# Patient Record
Sex: Female | Born: 1995
Health system: Southern US, Community
[De-identification: ages and names within clinical notes are randomized; demographics above are authoritative.]

## PROBLEM LIST (undated history)

## (undated) DIAGNOSIS — I10 Essential (primary) hypertension: Secondary | ICD-10-CM

## (undated) DIAGNOSIS — O24419 Gestational diabetes mellitus in pregnancy, unspecified control: Secondary | ICD-10-CM

## (undated) DIAGNOSIS — Z789 Other specified health status: Secondary | ICD-10-CM

## (undated) HISTORY — PX: ADENOIDECTOMY: SUR15

## (undated) HISTORY — DX: Gestational diabetes mellitus in pregnancy, unspecified control: O24.419

## (undated) HISTORY — PX: EYE SURGERY: SHX253

---
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
## 2018-08-28 ENCOUNTER — Encounter: Payer: Self-pay | Admitting: Obstetrics

## 2018-08-28 ENCOUNTER — Ambulatory Visit (INDEPENDENT_AMBULATORY_CARE_PROVIDER_SITE_OTHER): Payer: Self-pay

## 2018-08-28 ENCOUNTER — Other Ambulatory Visit: Payer: Self-pay

## 2018-08-28 VITALS — BP 140/93 | HR 101 | Ht 62.0 in | Wt 199.8 lb

## 2018-08-28 DIAGNOSIS — Z3201 Encounter for pregnancy test, result positive: Secondary | ICD-10-CM

## 2018-08-28 LAB — POCT URINE PREGNANCY: Preg Test, Ur: POSITIVE — AB

## 2018-08-28 NOTE — Progress Notes (Signed)
Presented for UPT.    Gloria Kemp presents today for UPT. She has no unusual complaints.  LMP:07/09/2018    OBJECTIVE: Appears well, in no apparent distress.  OB History    Gravida  1   Para      Term      Preterm      AB      Living        SAB      TAB      Ectopic      Multiple      Live Births             Home UPT Result: POSITIVE In-Office UPT result: POSITIVE  I have reviewed the patient's medical, obstetrical, social, and family histories, and medications.   ASSESSMENT: Positive pregnancy test  PLAN Prenatal care to be completed at: [facility].  Patient will see PCP for BP management.

## 2018-09-16 ENCOUNTER — Ambulatory Visit (INDEPENDENT_AMBULATORY_CARE_PROVIDER_SITE_OTHER): Payer: Self-pay

## 2018-09-16 ENCOUNTER — Other Ambulatory Visit: Payer: Self-pay

## 2018-09-16 DIAGNOSIS — Z3400 Encounter for supervision of normal first pregnancy, unspecified trimester: Secondary | ICD-10-CM

## 2018-09-16 HISTORY — DX: Encounter for supervision of normal first pregnancy, unspecified trimester: Z34.00

## 2018-09-16 NOTE — Progress Notes (Signed)
I connected with  Gloria Kemp on 09/16/18 by a video enabled telemedicine application and verified that I am speaking with the correct person using two identifiers.   I discussed the limitations of evaluation and management by telemedicine. The patient expressed understanding and agreed to proceed.  New OB Intake done.  Initial visit 10/07/2018 @ 1:00 pm.

## 2018-10-07 ENCOUNTER — Encounter: Payer: Self-pay | Admitting: Obstetrics & Gynecology

## 2018-10-07 ENCOUNTER — Ambulatory Visit (INDEPENDENT_AMBULATORY_CARE_PROVIDER_SITE_OTHER): Payer: Managed Care, Other (non HMO) | Admitting: Obstetrics & Gynecology

## 2018-10-07 ENCOUNTER — Other Ambulatory Visit: Payer: Self-pay

## 2018-10-07 VITALS — BP 138/91 | HR 97 | Wt 194.4 lb

## 2018-10-07 DIAGNOSIS — Z113 Encounter for screening for infections with a predominantly sexual mode of transmission: Secondary | ICD-10-CM

## 2018-10-07 DIAGNOSIS — O99212 Obesity complicating pregnancy, second trimester: Secondary | ICD-10-CM

## 2018-10-07 DIAGNOSIS — O99210 Obesity complicating pregnancy, unspecified trimester: Secondary | ICD-10-CM

## 2018-10-07 DIAGNOSIS — Z3400 Encounter for supervision of normal first pregnancy, unspecified trimester: Secondary | ICD-10-CM

## 2018-10-07 DIAGNOSIS — Z124 Encounter for screening for malignant neoplasm of cervix: Secondary | ICD-10-CM | POA: Diagnosis not present

## 2018-10-07 DIAGNOSIS — Z3A12 12 weeks gestation of pregnancy: Secondary | ICD-10-CM

## 2018-10-07 MED ORDER — ASPIRIN 81 MG PO CHEW: 81.0000 mg | CHEWABLE_TABLET | Freq: Every day | ORAL | 3 refills | Status: DC

## 2018-10-07 MED ORDER — BLOOD PRESSURE MONITORING KIT: 1.0000 | PACK | 0 refills | Status: DC

## 2018-10-07 NOTE — Progress Notes (Signed)
  Subjective:    Gloria Kemp is being seen today for her first obstetrical visit.  This is not a planned pregnancy. She is at [redacted]w[redacted]d gestation. Her obstetrical history is significant for obesity and Hypertension. Relationship with FOB: significant other, not living together. Patient does intend to breast feed. Pregnancy history fully reviewed.  Patient reports no complaints.  Review of Systems:   Review of Systems  Objective:     BP (!) 138/91   Pulse 97   Wt 194 lb 6.4 oz (88.2 kg)   LMP 07/09/2018 (Approximate)   BMI 35.56 kg/m  Physical Exam  Exam  Breathing, conversing, and ambulating normally Well nourished, well hydrated Black female, no apparent distress Heart- rrr Breast- normal bilateral Lungs- CTAB Abd- benign Vulva/vagina/cervix- normal Pelvis- normal    Assessment:    Pregnancy: G1P0 Patient Active Problem List   Diagnosis Date Noted  . Supervision of normal first pregnancy 09/16/2018       Plan:     Initial labs drawn. Prenatal vitamins. Problem list reviewed and updated. NIPS/Horizon today Role of ultrasound in pregnancy discussed; fetal survey: ordered. Amniocentesis discussed: not indicated. Follow up in 4 weeks, on Mychart Rec gain less than 20 pounds Baby asa daily Check pap, pr/cr, cmp, hba1c  She told me that she has already downloaded Mychart app and I rec'd that she download baby scripts Baby scripts [place] 10/07/2018

## 2018-10-07 NOTE — Addendum Note (Signed)
Addended by: Tristan Schroeder D on: 10/07/2018 02:11 PM   Modules accepted: Orders

## 2018-10-07 NOTE — Addendum Note (Signed)
Addended by: Tristan Schroeder D on: 10/07/2018 03:11 PM   Modules accepted: Orders

## 2018-10-07 NOTE — Progress Notes (Signed)
NOB pt denies pain today.

## 2018-10-07 NOTE — Addendum Note (Signed)
Addended by: Lewie Loron D on: 10/07/2018 02:59 PM   Modules accepted: Orders

## 2018-10-08 ENCOUNTER — Telehealth: Payer: Self-pay | Admitting: *Deleted

## 2018-10-08 LAB — OBSTETRIC PANEL, INCLUDING HIV
Antibody Screen: NEGATIVE
Basophils Absolute: 0 10*3/uL (ref 0.0–0.2)
Basos: 0 %
EOS (ABSOLUTE): 0.1 10*3/uL (ref 0.0–0.4)
Eos: 1 %
HIV Screen 4th Generation wRfx: NONREACTIVE
Hematocrit: 39.4 % (ref 34.0–46.6)
Hemoglobin: 12.9 g/dL (ref 11.1–15.9)
Hepatitis B Surface Ag: NEGATIVE
Immature Grans (Abs): 0 10*3/uL (ref 0.0–0.1)
Immature Granulocytes: 0 %
Lymphocytes Absolute: 2.2 10*3/uL (ref 0.7–3.1)
Lymphs: 25 %
MCH: 24.1 pg — ABNORMAL LOW (ref 26.6–33.0)
MCHC: 32.7 g/dL (ref 31.5–35.7)
MCV: 74 fL — ABNORMAL LOW (ref 79–97)
Monocytes Absolute: 0.8 10*3/uL (ref 0.1–0.9)
Monocytes: 9 %
Neutrophils Absolute: 5.8 10*3/uL (ref 1.4–7.0)
Neutrophils: 65 %
Platelets: 277 10*3/uL (ref 150–450)
RBC: 5.35 x10E6/uL — ABNORMAL HIGH (ref 3.77–5.28)
RDW: 18.3 % — ABNORMAL HIGH (ref 11.7–15.4)
RPR Ser Ql: NONREACTIVE
Rh Factor: POSITIVE
Rubella Antibodies, IGG: 3.61 index (ref >0.99)
WBC: 8.9 10*3/uL (ref 3.4–10.8)

## 2018-10-08 LAB — COMPREHENSIVE METABOLIC PANEL
ALT: 15 IU/L (ref 0–32)
AST: 16 IU/L (ref 0–40)
Albumin/Globulin Ratio: 1.8 (ref 1.2–2.2)
Albumin: 4.4 g/dL (ref 3.9–5.0)
Alkaline Phosphatase: 57 IU/L (ref 39–117)
BUN/Creatinine Ratio: 11 (ref 9–23)
BUN: 7 mg/dL (ref 6–20)
Bilirubin Total: <0.2 mg/dL (ref 0.0–1.2)
CO2: 19 mmol/L — ABNORMAL LOW (ref 20–29)
Calcium: 9.8 mg/dL (ref 8.7–10.2)
Chloride: 98 mmol/L (ref 96–106)
Creatinine, Ser: 0.63 mg/dL (ref 0.57–1.00)
GFR calc Af Amer: 148 mL/min/{1.73_m2} (ref >59)
GFR calc non Af Amer: 129 mL/min/{1.73_m2} (ref >59)
Globulin, Total: 2.5 g/dL (ref 1.5–4.5)
Glucose: 82 mg/dL (ref 65–99)
Potassium: 4.1 mmol/L (ref 3.5–5.2)
Sodium: 135 mmol/L (ref 134–144)
Total Protein: 6.9 g/dL (ref 6.0–8.5)

## 2018-10-08 LAB — HEMOGLOBIN A1C
Est. average glucose Bld gHb Est-mCnc: 117 mg/dL
Hgb A1c MFr Bld: 5.7 % — ABNORMAL HIGH (ref 4.8–5.6)

## 2018-10-08 LAB — PROTEIN / CREATININE RATIO, URINE
Creatinine, Urine: 288.8 mg/dL
Protein, Ur: 102.9 mg/dL
Protein/Creat Ratio: 356 mg/g creat — ABNORMAL HIGH (ref 0–200)

## 2018-10-08 LAB — TSH: TSH: 1.42 u[IU]/mL (ref 0.450–4.500)

## 2018-10-08 NOTE — Telephone Encounter (Signed)
Pt called to office stating she needs a note for work regarding N&V.    Attempt to return call.  No answer, no VM.

## 2018-10-09 LAB — CYTOLOGY - PAP
Chlamydia: NEGATIVE
Diagnosis: NEGATIVE
Neisseria Gonorrhea: NEGATIVE

## 2018-10-10 LAB — URINE CULTURE, OB REFLEX

## 2018-10-10 LAB — CULTURE, OB URINE

## 2018-10-15 ENCOUNTER — Encounter: Payer: Self-pay | Admitting: Obstetrics & Gynecology

## 2018-10-15 ENCOUNTER — Telehealth: Payer: Self-pay

## 2018-10-15 NOTE — Telephone Encounter (Signed)
Notified pt of insufficient fetal DNA on genetic screening, needs re-draw. Pt agreed to have it done at next appt on 10-24-18.

## 2018-10-16 ENCOUNTER — Encounter: Payer: Self-pay | Admitting: Obstetrics & Gynecology

## 2018-10-16 DIAGNOSIS — D563 Thalassemia minor: Secondary | ICD-10-CM | POA: Insufficient documentation

## 2018-10-19 DIAGNOSIS — O99210 Obesity complicating pregnancy, unspecified trimester: Secondary | ICD-10-CM | POA: Insufficient documentation

## 2018-10-24 ENCOUNTER — Other Ambulatory Visit: Payer: Self-pay

## 2018-10-24 DIAGNOSIS — Z3400 Encounter for supervision of normal first pregnancy, unspecified trimester: Secondary | ICD-10-CM

## 2018-10-24 NOTE — Addendum Note (Signed)
Addended by: Tristan Schroeder D on: 10/24/2018 10:28 AM   Modules accepted: Orders

## 2018-10-24 NOTE — Addendum Note (Signed)
Addended by: Tristan Schroeder D on: 10/24/2018 10:18 AM   Modules accepted: Orders

## 2018-10-25 LAB — GLUCOSE TOLERANCE, 2 HOURS W/ 1HR
Glucose, 1 hour: 207 mg/dL — ABNORMAL HIGH (ref 65–179)
Glucose, 2 hour: 148 mg/dL (ref 65–152)
Glucose, Fasting: 96 mg/dL — ABNORMAL HIGH (ref 65–91)

## 2018-10-27 ENCOUNTER — Other Ambulatory Visit: Payer: Self-pay

## 2018-10-27 DIAGNOSIS — O24419 Gestational diabetes mellitus in pregnancy, unspecified control: Secondary | ICD-10-CM

## 2018-10-27 DIAGNOSIS — Z3400 Encounter for supervision of normal first pregnancy, unspecified trimester: Secondary | ICD-10-CM

## 2018-10-27 HISTORY — DX: Gestational diabetes mellitus in pregnancy, unspecified control: O24.419

## 2018-10-27 MED ORDER — ACCU-CHEK GUIDE W/DEVICE KIT: 1.0000 | PACK | Freq: Four times a day (QID) | 0 refills | Status: DC

## 2018-10-27 MED ORDER — ACCU-CHEK GUIDE VI STRP: ORAL_STRIP | 12 refills | Status: DC

## 2018-10-27 MED ORDER — ACCU-CHEK FASTCLIX LANCETS MISC: 1.0000 [IU] | Freq: Four times a day (QID) | 12 refills | Status: DC

## 2018-11-04 ENCOUNTER — Other Ambulatory Visit (HOSPITAL_COMMUNITY): Payer: Self-pay | Admitting: Obstetrics

## 2018-11-04 ENCOUNTER — Telehealth (INDEPENDENT_AMBULATORY_CARE_PROVIDER_SITE_OTHER): Payer: Medicaid Other | Admitting: Obstetrics

## 2018-11-04 ENCOUNTER — Encounter: Payer: Self-pay | Admitting: Obstetrics

## 2018-11-04 DIAGNOSIS — D563 Thalassemia minor: Secondary | ICD-10-CM

## 2018-11-04 DIAGNOSIS — O0990 Supervision of high risk pregnancy, unspecified, unspecified trimester: Secondary | ICD-10-CM

## 2018-11-04 DIAGNOSIS — O0992 Supervision of high risk pregnancy, unspecified, second trimester: Secondary | ICD-10-CM

## 2018-11-04 DIAGNOSIS — O24419 Gestational diabetes mellitus in pregnancy, unspecified control: Secondary | ICD-10-CM | POA: Diagnosis not present

## 2018-11-04 DIAGNOSIS — Z3A16 16 weeks gestation of pregnancy: Secondary | ICD-10-CM | POA: Diagnosis not present

## 2018-11-04 NOTE — Progress Notes (Signed)
   [place] VIRTUAL VIDEO VISIT ENCOUNTER NOTE  Provider location: [facility] at [place]   I connected with Gloria Kemp on 11/04/18 at 10:00 AM EDT by WebEx OB MyChart Video Encounter at home and verified that I am speaking with the correct person using two identifiers.   I discussed the limitations, risks, security and privacy concerns of performing an evaluation and management service virtually and the availability of in person appointments. I also discussed with the patient that there may be a patient responsible charge related to this service. The patient expressed understanding and agreed to proceed. Subjective:  Gloria Kemp is a 23 y.o. G1P0 at [redacted]w[redacted]d being seen today for ongoing prenatal care.  She is currently monitored for the following issues for this high-risk pregnancy and has Supervision of normal first pregnancy; Alpha thalassemia silent carrier; Obesity in pregnancy; and Gestational diabetes on their problem list.  Patient reports no complaints.  Contractions: Not present. Vag. Bleeding: None.   . Denies any leaking of fluid.   The following portions of the patient's history were reviewed and updated as appropriate: allergies, current medications, past family history, past medical history, past social history, past surgical history and problem list.   Objective:  There were no vitals filed for this visit.  Fetal Status:           General:  Alert, oriented and cooperative. Patient is in no acute distress.  Respiratory: Normal respiratory effort, no problems with respiration noted  Mental Status: Normal mood and affect. Normal behavior. Normal judgment and thought content.  Rest of physical exam deferred due to type of encounter  Imaging: No results found.  Assessment and Plan:  Pregnancy: G1P0 at [redacted]w[redacted]d 1. Supervision of high risk pregnancy, antepartum  2. Gestational diabetes mellitus (GDM), antepartum, gestational diabetes  method of control unspecified  3. Alpha thalassemia silent carrier   Preterm labor symptoms and general obstetric precautions including but not limited to vaginal bleeding, contractions, leaking of fluid and fetal movement were reviewed in detail with the patient. I discussed the assessment and treatment plan with the patient. The patient was provided an opportunity to ask questions and all were answered. The patient agreed with the plan and demonstrated an understanding of the instructions. The patient was advised to call back or seek an in-person office evaluation/go to MAU at [facility] for any urgent or concerning symptoms. Please refer to After Visit Summary for other counseling recommendations.   I provided 10 minutes of face-to-face time during this encounter.  Return in about 4 weeks (around 12/02/2018) for MyChart.  Future Appointments  Date Time Provider [place]  11/19/2018  1:30 PM WH-MFC [place] 1 WH-MFCUS MFC-US  11/19/2018  3:00 PM [place]    Baltazar Najjar, [place] [facility], [facility] 11-04-2018

## 2018-11-04 NOTE — Progress Notes (Addendum)
I connected with Gloria Kemp on 11/04/18 at 10:00 AM EDT by telephone and verified that I am speaking with the correct person using two identifiers.  Pt did not start checking CBG's. She will pick up supplies this wk. Pt requests genetic screening results.  Results still pending. Pt did not receive cuff yet. Will refax today

## 2018-11-06 ENCOUNTER — Encounter: Payer: Self-pay | Admitting: Obstetrics

## 2018-11-19 ENCOUNTER — Ambulatory Visit: Payer: Medicaid Other | Admitting: *Deleted

## 2018-11-19 ENCOUNTER — Encounter: Payer: Managed Care, Other (non HMO) | Attending: Family Medicine | Admitting: *Deleted

## 2018-11-19 ENCOUNTER — Other Ambulatory Visit: Payer: Self-pay

## 2018-11-19 ENCOUNTER — Ambulatory Visit (HOSPITAL_COMMUNITY)
Admission: RE | Admit: 2018-11-19 | Discharge: 2018-11-19 | Disposition: A | Discharge status: Home / Self Care | Payer: Medicaid Other | Source: Ambulatory Visit | Attending: Obstetrics & Gynecology | Admitting: Obstetrics & Gynecology

## 2018-11-19 ENCOUNTER — Other Ambulatory Visit (HOSPITAL_COMMUNITY): Payer: Self-pay | Admitting: *Deleted

## 2018-11-19 ENCOUNTER — Other Ambulatory Visit: Payer: Self-pay | Admitting: Obstetrics & Gynecology

## 2018-11-19 DIAGNOSIS — Z3400 Encounter for supervision of normal first pregnancy, unspecified trimester: Secondary | ICD-10-CM

## 2018-11-19 DIAGNOSIS — Z713 Dietary counseling and surveillance: Secondary | ICD-10-CM | POA: Diagnosis present

## 2018-11-19 DIAGNOSIS — O99012 Anemia complicating pregnancy, second trimester: Secondary | ICD-10-CM | POA: Diagnosis not present

## 2018-11-19 DIAGNOSIS — O24312 Unspecified pre-existing diabetes mellitus in pregnancy, second trimester: Secondary | ICD-10-CM | POA: Diagnosis not present

## 2018-11-19 DIAGNOSIS — O359XX0 Maternal care for (suspected) fetal abnormality and damage, unspecified, not applicable or unspecified: Secondary | ICD-10-CM | POA: Diagnosis not present

## 2018-11-19 DIAGNOSIS — O24119 Pre-existing diabetes mellitus, type 2, in pregnancy, unspecified trimester: Secondary | ICD-10-CM

## 2018-11-19 DIAGNOSIS — O24419 Gestational diabetes mellitus in pregnancy, unspecified control: Secondary | ICD-10-CM | POA: Diagnosis not present

## 2018-11-19 DIAGNOSIS — Z3A17 17 weeks gestation of pregnancy: Secondary | ICD-10-CM

## 2018-11-19 DIAGNOSIS — Z3687 Encounter for antenatal screening for uncertain dates: Secondary | ICD-10-CM

## 2018-11-19 NOTE — Progress Notes (Signed)
Patient was seen on 11/19/2018 for Impaired Glucose Tolerance at 19 weeks. There is an A1c of 5.7% on July 14 that indicates a history of Pre-Diabetes prior to this pregnancy.  EDD 04/26/2019. Patient states not aware of history of pre- diabetes although she states she has a strong family history.  Diet history obtained. Patient eats poor variety of all food groups with most meals being eaten at [employer] where she works part time. Beverages include regular soda, some water and occasional diet soda.  Patient is currently on no diabetes medications.  The following learning objectives were met by the patient :   States the definition of Pre-diabetes with pregnancy   States why dietary management is important in controlling blood glucose  Describes the effects of carbohydrates on blood glucose levels  Demonstrates ability to create a balanced meal plan  Demonstrates carbohydrate counting   States when to check blood glucose levels  Demonstrates proper blood glucose monitoring techniques  States the effect of stress and exercise on blood glucose levels  States the importance of limiting caffeine and abstaining from alcohol and smoking  Plan:   Aim for 3 Carb Choices per meal (45 grams) +/- 1 either way   Aim for 1-2 Carbs per snack  Begin reading food labels for Total Carbohydrate of foods  Consider  increasing your activity level by walking or other activity daily as tolerated  Begin checking BG before breakfast and 2 hours after first bite of breakfast, lunch and dinner as directed by MD   Bring Log Book/Sheet and meter to every medical appointment OR use Baby Scripts (see below)  Patient was introduced to [employer], she plans to use as record of BG electronically   Take medication as directed by MD  Blood glucose monitor Rx called into pharmacy by MD office, she has not picked it up yet.   Accu Check Guide with Fast Clix drums Patient instructed to test pre breakfast and 2  hours each meal as directed by MD  Patient instructed to monitor glucose levels: FBS: 60 - 95 mg/dl 2 hour: <120 mg/dl  Patient received the following handouts:  Nutrition Diabetes and Pregnancy  Carbohydrate Counting List  Patient will be seen for follow-up in 1 month and as needed.

## 2018-12-01 ENCOUNTER — Encounter: Payer: Self-pay | Admitting: Family Medicine

## 2018-12-02 ENCOUNTER — Telehealth: Payer: Medicaid Other | Admitting: Obstetrics

## 2018-12-02 ENCOUNTER — Encounter: Payer: Self-pay | Admitting: Obstetrics

## 2018-12-02 MED ORDER — BLOOD PRESSURE MONITOR KIT: 1.0000 | PACK | 0 refills | Status: DC

## 2018-12-02 NOTE — Progress Notes (Signed)
Virtual ROB   CC: Back Pain   Patient has not received B/P Cuff resent to [employer].

## 2018-12-02 NOTE — Progress Notes (Signed)
Patient rescheduled.  Shelly Bombard, MD 12/02/2018 2:12 PM

## 2018-12-11 ENCOUNTER — Telehealth (INDEPENDENT_AMBULATORY_CARE_PROVIDER_SITE_OTHER): Payer: Medicaid Other | Admitting: Obstetrics

## 2018-12-11 ENCOUNTER — Encounter: Payer: Self-pay | Admitting: Obstetrics

## 2018-12-11 ENCOUNTER — Other Ambulatory Visit: Payer: Self-pay

## 2018-12-11 ENCOUNTER — Encounter (HOSPITAL_COMMUNITY): Payer: Self-pay | Admitting: Obstetrics & Gynecology

## 2018-12-11 DIAGNOSIS — M549 Dorsalgia, unspecified: Secondary | ICD-10-CM

## 2018-12-11 DIAGNOSIS — O99210 Obesity complicating pregnancy, unspecified trimester: Secondary | ICD-10-CM

## 2018-12-11 DIAGNOSIS — O24410 Gestational diabetes mellitus in pregnancy, diet controlled: Secondary | ICD-10-CM

## 2018-12-11 DIAGNOSIS — Z3A20 20 weeks gestation of pregnancy: Secondary | ICD-10-CM

## 2018-12-11 DIAGNOSIS — O99212 Obesity complicating pregnancy, second trimester: Secondary | ICD-10-CM

## 2018-12-11 DIAGNOSIS — O35BXX0 Maternal care for other (suspected) fetal abnormality and damage, fetal cardiac anomalies, not applicable or unspecified: Secondary | ICD-10-CM | POA: Insufficient documentation

## 2018-12-11 DIAGNOSIS — D563 Thalassemia minor: Secondary | ICD-10-CM

## 2018-12-11 DIAGNOSIS — O0990 Supervision of high risk pregnancy, unspecified, unspecified trimester: Secondary | ICD-10-CM

## 2018-12-11 DIAGNOSIS — O0992 Supervision of high risk pregnancy, unspecified, second trimester: Secondary | ICD-10-CM

## 2018-12-11 MED ORDER — COMFORT FIT MATERNITY SUPP SM MISC: 0 refills | Status: DC

## 2018-12-11 NOTE — Addendum Note (Signed)
Addended by: Baltazar Najjar A on: 12/11/2018 10:54 AM   Modules accepted: Level of Service

## 2018-12-11 NOTE — Progress Notes (Signed)
TELEHEALTH OBSTETRICS PRENATAL VIRTUAL VIDEO VISIT ENCOUNTER NOTE  Provider location: [facility] at [place]   I connected with Gloria Kemp on 12/11/18 at  9:30 AM EDT by OB MyChart Video Encounter at home and verified that I am speaking with the correct person using two identifiers.   I discussed the limitations, risks, security and privacy concerns of performing an evaluation and management service virtually and the availability of in person appointments. I also discussed with the patient that there may be a patient responsible charge related to this service. The patient expressed understanding and agreed to proceed. Subjective:  Gloria Kemp is a 23 y.o. G1P0 at [redacted]w[redacted]d being seen today for ongoing prenatal care.  She is currently monitored for the following issues for this high-risk pregnancy and has Supervision of normal first pregnancy; Alpha thalassemia silent carrier; Obesity in pregnancy; and Gestational diabetes on their problem list.  Patient reports backache.  Contractions: Not present. Vag. Bleeding: None.  Movement: Present. Denies any leaking of fluid.   The following portions of the patient's history were reviewed and updated as appropriate: allergies, current medications, past family history, past medical history, past social history, past surgical history and problem list.   Objective:  There were no vitals filed for this visit.  Fetal Status:     Movement: Present     General:  Alert, oriented and cooperative. Patient is in no acute distress.  Respiratory: Normal respiratory effort, no problems with respiration noted  Mental Status: Normal mood and affect. Normal behavior. Normal judgment and thought content.  Rest of physical exam deferred due to type of encounter  Imaging: [place] Mfm Ob Detail +14 Wk  Result Date: 11/19/2018 ----------------------------------------------------------------------  OBSTETRICS REPORT                       (Signed Final  11/19/2018 05:04 pm) ---------------------------------------------------------------------- Patient Info  ID #:       [id number]                          D.O.B.:  [birth year] (22 yrs)  Name:       Gloria Kemp                  Visit Date: 11/19/2018 01:23 pm ---------------------------------------------------------------------- Performed By  Performed By:     Earley Brooke     Ref. Address:     [address]                                                             Road                                                             [address] [phone]  [place]                                                             [place]  Attending:        Sander Nephew      [facility]                    MD                                       [facility]  Referred By:      [place] ---------------------------------------------------------------------- Orders   #  Description                          Code         Ordered By   1  [place] MFM OB DETAIL +14 [place]              76811.01     [facility]  ----------------------------------------------------------------------   #  Order #                    Accession #                 Episode #   1  [id number]                  [id number]                  [id number]  ---------------------------------------------------------------------- Indications   Encounter for antenatal screening for          Z36.3   malformations   [redacted] weeks gestation of pregnancy                Z3A.17   Diabetes - Pregestational, 2nd trimester       O24.312   Maternal thalassemia complicating              O99.012   pregnancy in second trimester (Alpha Thal-   silent carrier)   Encounter for uncertain dates                  Z36.87   Fetal abnormality - other known or             O35.9XX0   suspected (Echogenic focus in abdomen)  ---------------------------------------------------------------------- Fetal  Evaluation  Num Of Fetuses:         1  Fetal Heart Rate(bpm):  151  Cardiac Activity:       Observed  Presentation:           Cephalic  Placenta:               Anterior  P. Cord Insertion:      Visualized, central  Amniotic Fluid  AFI FV:      Within normal limits                              Largest Pocket(cm)  3.95 ---------------------------------------------------------------------- Biometry  BPD:      38.1  mm     G. Age:  17w 4d         59  %    CI:        76.31   %    70 - 86                                                          FL/HC:      17.8   %    14.6 - 17.6  HC:      138.2  mm     G. Age:  17w 2d         29  %    HC/AC:      1.22        1.07 - 1.29  AC:      113.1  mm     G. Age:  17w 1d         38  %    FL/BPD:     64.6   %  FL:       24.6  mm     G. Age:  17w 3d         45  %    FL/AC:      21.8   %    20 - 24  HUM:      27.6  mm     G. Age:  18w 6d         92  %  CER:        17  mm     G. Age:  17w 0d         40  %  NFT:       4.6  mm  LV:        5.7  mm  CM:        4.6  mm  Est. FW:     188  gm      0 lb 7 oz     34  % ---------------------------------------------------------------------- OB History  Gravidity:    1         Term:   0        Prem:   0        SAB:   0  TOP:          0       Ectopic:  0        Living: 0 ---------------------------------------------------------------------- Gestational Age  U/S Today:     17w 3d                                        EDD:   04/26/19  Best:          17w 3d     Det. By:  U/S (11/19/18)           EDD:   04/26/19 ---------------------------------------------------------------------- Anatomy  Cranium:               Not well visualized    Ductal Arch:            Appears normal  Cavum:  Not well visualized    Diaphragm:              Appears normal  Ventricles:            Appears normal         Stomach:                Appears normal, left                                                                        sided   Choroid Plexus:        Appears normal         Abdomen:                Echogenic Focus  Cerebellum:            Appears normal         Abdominal Wall:         Not well visualized  Posterior Fossa:       Appears normal         Cord Vessels:           Appears normal (3                                                                        vessel cord)  Face:                  Appears normal         Kidneys:                Not well visualized                         (orbits and profile)  Lips:                  Appears normal         Bladder:                Appears normal  Heart:                 Not well visualized    Spine:                  Not well visualized  RVOT:                  Not well visualized    Upper Extremities:      Visualized  LVOT:                  Not well visualized    Lower Extremities:      Visualized  Aortic Arch:           Appears normal  Other:  Fetus appears to be a female. Heels visualized. Technically difficult due          to maternal habitus and fetal position. ---------------------------------------------------------------------- Cervix Uterus Adnexa  Cervix  Length:           3.12  cm.  Normal appearance by transabdominal scan.  Left Ovary  Within normal limits.  Right Ovary  Not visualized.  Adnexa  No abnormality visualized. ---------------------------------------------------------------------- Impression  Normal interval growth.  Pregestational diabetes with 1hr GTT >200 and HbA1C 5.7%  Suboptimal views of the fetal anatomy obtained.  Discussed increased risk for fetal heart defects  Ms. Spilde has diabetes education scheduled today. ---------------------------------------------------------------------- Recommendations  Repeat growth in 4 weeks  Fetal echocardiogram referral submitted. ----------------------------------------------------------------------               Lin Landsman, MD Electronically Signed Final Report   11/19/2018 05:04 pm  ----------------------------------------------------------------------   Assessment and Plan:  Pregnancy: G1P0 at [redacted]w[redacted]d 1. Supervision of high risk pregnancy, antepartum  2. Diet controlled gestational diabetes mellitus (GDM), antepartum - needs to pick up testing supplies  3. Backache symptom Rx: - Elastic Bandages & Supports (COMFORT FIT MATERNITY SUPP SM) MISC; Wear as directed.  Dispense: 1 each; Refill: 0  4. Alpha thalassemia silent carrier  5. Obesity in pregnancy   Preterm labor symptoms and general obstetric precautions including but not limited to vaginal bleeding, contractions, leaking of fluid and fetal movement were reviewed in detail with the patient. I discussed the assessment and treatment plan with the patient. The patient was provided an opportunity to ask questions and all were answered. The patient agreed with the plan and demonstrated an understanding of the instructions. The patient was advised to call back or seek an in-person office evaluation/go to MAU at [facility] for any urgent or concerning symptoms. Please refer to After Visit Summary for other counseling recommendations.   I provided 10 minutes of face-to-face time during this encounter.  Return in about 4 weeks (around 01/08/2019) for MyChart.  , [facility] patient.  Future Appointments  Date Time Provider Department Center  12/17/2018 12:45 PM WH-MFC NURSE WH-MFC MFC-US  12/17/2018 12:45 PM WH-MFC [place] 2 WH-MFCUS MFC-US  12/17/2018  2:00 PM WOC-EDUCATION WOC-WOCA WOC    Coral Ceo, MD [facility] for [employer] [facility], [facility] 12/11/2018

## 2018-12-11 NOTE — Progress Notes (Signed)
S/w patient for mychart visit, pt reports fetal movement with occasional back pain. Pt has not picked up BP cuff or BG supplies yet because of insurance issues, pt states she will get them today.

## 2018-12-11 NOTE — Addendum Note (Signed)
Addended by: Baltazar Najjar A on: 12/11/2018 10:52 AM   Modules accepted: Level of Service

## 2018-12-17 ENCOUNTER — Telehealth: Payer: Self-pay | Admitting: Family Medicine

## 2018-12-17 ENCOUNTER — Other Ambulatory Visit: Payer: Medicaid Other

## 2018-12-17 ENCOUNTER — Other Ambulatory Visit (HOSPITAL_COMMUNITY): Payer: Self-pay | Admitting: *Deleted

## 2018-12-17 ENCOUNTER — Encounter (HOSPITAL_COMMUNITY): Payer: Self-pay

## 2018-12-17 ENCOUNTER — Other Ambulatory Visit: Payer: Self-pay

## 2018-12-17 ENCOUNTER — Ambulatory Visit (HOSPITAL_COMMUNITY)
Admission: RE | Admit: 2018-12-17 | Discharge: 2018-12-17 | Disposition: A | Discharge status: Home / Self Care | Payer: Managed Care, Other (non HMO) | Source: Ambulatory Visit | Attending: Obstetrics and Gynecology | Admitting: Obstetrics and Gynecology

## 2018-12-17 ENCOUNTER — Ambulatory Visit (HOSPITAL_COMMUNITY): Payer: Managed Care, Other (non HMO) | Admitting: *Deleted

## 2018-12-17 DIAGNOSIS — O99012 Anemia complicating pregnancy, second trimester: Secondary | ICD-10-CM

## 2018-12-17 DIAGNOSIS — O24410 Gestational diabetes mellitus in pregnancy, diet controlled: Secondary | ICD-10-CM | POA: Diagnosis present

## 2018-12-17 DIAGNOSIS — O359XX0 Maternal care for (suspected) fetal abnormality and damage, unspecified, not applicable or unspecified: Secondary | ICD-10-CM | POA: Diagnosis not present

## 2018-12-17 DIAGNOSIS — Z362 Encounter for other antenatal screening follow-up: Secondary | ICD-10-CM | POA: Diagnosis not present

## 2018-12-17 DIAGNOSIS — O24312 Unspecified pre-existing diabetes mellitus in pregnancy, second trimester: Secondary | ICD-10-CM | POA: Diagnosis not present

## 2018-12-17 DIAGNOSIS — D563 Thalassemia minor: Secondary | ICD-10-CM | POA: Diagnosis present

## 2018-12-17 DIAGNOSIS — Z3A21 21 weeks gestation of pregnancy: Secondary | ICD-10-CM

## 2018-12-17 DIAGNOSIS — O24119 Pre-existing diabetes mellitus, type 2, in pregnancy, unspecified trimester: Secondary | ICD-10-CM | POA: Diagnosis not present

## 2018-12-17 NOTE — Telephone Encounter (Signed)
Patient called in stating that she needs to reschedule her diabetes appointment that is scheduled for 9/23. Patient stated that she had issues with getting her insurance and just received her meter. Patient was rescheduled for 10/6. This was the next appointment that the patient could do.

## 2018-12-30 ENCOUNTER — Encounter: Payer: Managed Care, Other (non HMO) | Attending: Family Medicine | Admitting: *Deleted

## 2018-12-30 ENCOUNTER — Other Ambulatory Visit: Payer: Self-pay

## 2018-12-30 ENCOUNTER — Ambulatory Visit: Payer: Managed Care, Other (non HMO) | Admitting: *Deleted

## 2018-12-30 DIAGNOSIS — Z713 Dietary counseling and surveillance: Secondary | ICD-10-CM | POA: Insufficient documentation

## 2018-12-30 DIAGNOSIS — O24419 Gestational diabetes mellitus in pregnancy, unspecified control: Secondary | ICD-10-CM | POA: Diagnosis not present

## 2018-12-30 NOTE — Progress Notes (Signed)
This visit was completed via telephone due to the COVID-19 pandemic. Patient had called our office this AM with a sore throat and stated she needed to have this visit over the phone instead of coming into our office.  I spoke with Gloria Kemp and verified that I was speaking with the correct person with two patient identifiers (full name and date of birth).   I discussed the limitations related to this kind of visit and the patient is willing to proceed.  Patient was spoken to on the phone on 12/30/2018 for GDM follow up visit. She started the conversation with concerns about the insurance information listed and wanting to know who put her primary insurance information in her chart as she states she did not provide it. I stated I did not know but would check on it for her.   She then stated that she was charged for her Accu Chek meter and was under the impression it was covered by Medicaid, which is what I told her at our first visit. She also stated she didn't get any strips or lancets when she got her meter. I checked the RX and they are both included under Medications. I called [facility] who explained that they did not have her Medicaid number so the meter was run under her primary insurance, which she had a co-pay for. She ran the strips and lancets thru and stated they would be ready this afternoon, covered by Medicaid. I called patient back to let her know she could pick up the supplies this afternoon.   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 Her beverages are now sugar free and under 5 calories each.  I encouraged her to pick up her supplies this afternoon and post her BG numbers in Baby Scripts so we can follow how she is doing.    Patient will be seen for follow-up as needed.

## 2019-01-08 ENCOUNTER — Other Ambulatory Visit: Payer: Self-pay

## 2019-01-08 ENCOUNTER — Ambulatory Visit (INDEPENDENT_AMBULATORY_CARE_PROVIDER_SITE_OTHER): Payer: Managed Care, Other (non HMO) | Admitting: Obstetrics & Gynecology

## 2019-01-08 VITALS — BP 141/88 | HR 105 | Wt 193.9 lb

## 2019-01-08 DIAGNOSIS — O10012 Pre-existing essential hypertension complicating pregnancy, second trimester: Secondary | ICD-10-CM

## 2019-01-08 DIAGNOSIS — Z3400 Encounter for supervision of normal first pregnancy, unspecified trimester: Secondary | ICD-10-CM

## 2019-01-08 DIAGNOSIS — Z3A24 24 weeks gestation of pregnancy: Secondary | ICD-10-CM

## 2019-01-08 DIAGNOSIS — O99212 Obesity complicating pregnancy, second trimester: Secondary | ICD-10-CM

## 2019-01-08 DIAGNOSIS — O24410 Gestational diabetes mellitus in pregnancy, diet controlled: Secondary | ICD-10-CM

## 2019-01-08 DIAGNOSIS — D563 Thalassemia minor: Secondary | ICD-10-CM

## 2019-01-08 DIAGNOSIS — O99210 Obesity complicating pregnancy, unspecified trimester: Secondary | ICD-10-CM

## 2019-01-08 DIAGNOSIS — O10019 Pre-existing essential hypertension complicating pregnancy, unspecified trimester: Secondary | ICD-10-CM

## 2019-01-08 NOTE — Progress Notes (Signed)
Pt is here for ROB. [redacted]w[redacted]d. GDM, diet controlled. Pt has not started checking her blood sugar yet, pt reports she does not have supplies. Pt also reports she has been having headaches off and on, she has not tried Tylenol.

## 2019-01-08 NOTE — Progress Notes (Signed)
   PRENATAL VISIT NOTE  Subjective:  Gloria Kemp is a 23 y.o. G1P0 at [redacted]w[redacted]d being seen today for ongoing prenatal care.  She is currently monitored for the following issues for this high-risk pregnancy and has Supervision of normal first pregnancy; Alpha thalassemia silent carrier; Obesity in pregnancy; and Gestational diabetes on their problem list.  Patient reports no complaints.  Contractions: Not present. Vag. Bleeding: None.  Movement: Present. Denies leaking of fluid.   The following portions of the patient's history were reviewed and updated as appropriate: allergies, current medications, past family history, past medical history, past social history, past surgical history and problem list.   Objective:   Vitals:   01/08/19 0959 01/08/19 1002  BP: (!) 149/92 (!) 141/88  Pulse: (!) 103 (!) 105  Weight: 193 lb 14.4 oz (88 kg)     Fetal Status:     Movement: Present     General:  Alert, oriented and cooperative. Patient is in no acute distress.  Skin: Skin is warm and dry. No rash noted.   Cardiovascular: Normal heart rate noted  Respiratory: Normal respiratory effort, no problems with respiration noted  Abdomen: Soft, gravid, appropriate for gestational age.  Pain/Pressure: Absent     Pelvic: Cervical exam deferred        Extremities: Normal range of motion.  Edema: Trace  Mental Status: Normal mood and affect. Normal behavior. Normal judgment and thought content.   Assessment and Plan:  Pregnancy: G1P0 at [redacted]w[redacted]d 1. Supervision of normal first pregnancy, antepartum Good FM  S>D  2. Obesity in pregnancy Pt is eating better since her visit with the diabetic educator. Has lost weight. She feels that she is eating a balanced diet.   3. Diet controlled gestational diabetes mellitus (GDM), antepartum Pt has not begun to check her glucose as she has had a rpoblem with supplies. We called [facility] and the supplies were just sent 2 days prev. Pt should get in 1-2 days. Pt is aware of  how to log her glc on BS   4. Alpha thalassemia silent carrier  5. Chronic HTN Taking baby ASA  Preterm labor symptoms and general obstetric precautions including but not limited to vaginal bleeding, contractions, leaking of fluid and fetal movement were reviewed in detail with the patient. Please refer to After Visit Summary for other counseling recommendations.   Return in about 4 weeks (around 02/05/2019).  Future Appointments  Date Time Provider [place]  01/14/2019  8:45 AM [place] MFC-US  01/14/2019  8:45 AM [place] 2 WH-MFCUS MFC-US    Lavonia Drafts, MD

## 2019-01-14 ENCOUNTER — Other Ambulatory Visit: Payer: Self-pay

## 2019-01-14 ENCOUNTER — Ambulatory Visit (HOSPITAL_COMMUNITY): Payer: Managed Care, Other (non HMO) | Admitting: *Deleted

## 2019-01-14 ENCOUNTER — Encounter (HOSPITAL_COMMUNITY): Payer: Self-pay

## 2019-01-14 ENCOUNTER — Other Ambulatory Visit (HOSPITAL_COMMUNITY): Payer: Self-pay | Admitting: *Deleted

## 2019-01-14 ENCOUNTER — Ambulatory Visit (HOSPITAL_COMMUNITY)
Admission: RE | Admit: 2019-01-14 | Discharge: 2019-01-14 | Disposition: A | Discharge status: Home / Self Care | Payer: Managed Care, Other (non HMO) | Source: Ambulatory Visit | Attending: Obstetrics | Admitting: Obstetrics

## 2019-01-14 DIAGNOSIS — D563 Thalassemia minor: Secondary | ICD-10-CM

## 2019-01-14 DIAGNOSIS — Z362 Encounter for other antenatal screening follow-up: Secondary | ICD-10-CM | POA: Insufficient documentation

## 2019-01-14 DIAGNOSIS — O24410 Gestational diabetes mellitus in pregnancy, diet controlled: Secondary | ICD-10-CM | POA: Insufficient documentation

## 2019-01-14 DIAGNOSIS — O24913 Unspecified diabetes mellitus in pregnancy, third trimester: Secondary | ICD-10-CM

## 2019-01-14 DIAGNOSIS — Z3A25 25 weeks gestation of pregnancy: Secondary | ICD-10-CM

## 2019-01-14 DIAGNOSIS — O359XX0 Maternal care for (suspected) fetal abnormality and damage, unspecified, not applicable or unspecified: Secondary | ICD-10-CM | POA: Diagnosis not present

## 2019-01-17 ENCOUNTER — Encounter: Payer: Self-pay | Admitting: Family Medicine

## 2019-01-22 ENCOUNTER — Encounter: Payer: Self-pay | Admitting: Obstetrics and Gynecology

## 2019-01-22 ENCOUNTER — Telehealth (INDEPENDENT_AMBULATORY_CARE_PROVIDER_SITE_OTHER): Payer: Managed Care, Other (non HMO) | Admitting: Obstetrics and Gynecology

## 2019-01-22 DIAGNOSIS — O99112 Other diseases of the blood and blood-forming organs and certain disorders involving the immune mechanism complicating pregnancy, second trimester: Secondary | ICD-10-CM

## 2019-01-22 DIAGNOSIS — O10019 Pre-existing essential hypertension complicating pregnancy, unspecified trimester: Secondary | ICD-10-CM

## 2019-01-22 DIAGNOSIS — O283 Abnormal ultrasonic finding on antenatal screening of mother: Secondary | ICD-10-CM

## 2019-01-22 DIAGNOSIS — O10012 Pre-existing essential hypertension complicating pregnancy, second trimester: Secondary | ICD-10-CM

## 2019-01-22 DIAGNOSIS — D563 Thalassemia minor: Secondary | ICD-10-CM

## 2019-01-22 DIAGNOSIS — Z3402 Encounter for supervision of normal first pregnancy, second trimester: Secondary | ICD-10-CM

## 2019-01-22 DIAGNOSIS — Z3A26 26 weeks gestation of pregnancy: Secondary | ICD-10-CM

## 2019-01-22 DIAGNOSIS — O24410 Gestational diabetes mellitus in pregnancy, diet controlled: Secondary | ICD-10-CM

## 2019-01-22 NOTE — Progress Notes (Signed)
   [place] VIRTUAL VIDEO VISIT ENCOUNTER NOTE  Provider location: [facility] for [facility] at [place]   I connected with Jacqlyn Larsen on 01/22/19 at  3:45 PM EDT by MyChart Video Encounter at home and verified that I am speaking with the correct person using two identifiers.   I discussed the limitations, risks, security and privacy concerns of performing an evaluation and management service virtually and the availability of in person appointments. I also discussed with the patient that there may be a patient responsible charge related to this service. The patient expressed understanding and agreed to proceed. Subjective:  Gloria Kemp is a 23 y.o. G1P0 at [redacted]w[redacted]d being seen today for ongoing prenatal care.  She is currently monitored for the following issues for this high-risk pregnancy and has Supervision of normal first pregnancy; Alpha thalassemia silent carrier; Obesity in pregnancy; Gestational diabetes; Chronic benign essential hypertension, antepartum; and Abnormal fetal ultrasound on their problem list.  Patient reports no complaints.  Contractions: Not present. Vag. Bleeding: None.  Movement: Present. Denies any leaking of fluid.   The following portions of the patient's history were reviewed and updated as appropriate: allergies, current medications, past family history, past medical history, past social history, past surgical history and problem list.   Objective:  There were no vitals filed for this visit.  Fetal Status:     Movement: Present     General:  Alert, oriented and cooperative. Patient is in no acute distress.  Respiratory: Normal respiratory effort, no problems with respiration noted  Mental Status: Normal mood and affect. Normal behavior. Normal judgment and thought content.  Rest of physical exam deferred due to type of encounter   Assessment and Plan:  Pregnancy: G1P0 at [redacted]w[redacted]d  1. Diet controlled gestational diabetes mellitus (GDM)  in second trimester Received blood glucose meter and supplies yesterday FG: 76 PP: 95  F/u growth scheduled for 02/11/19  2. Alpha thalassemia silent carrier  3. Encounter for supervision of normal first pregnancy in second trimester  4. Chronic benign essential hypertension, antepartum Just got BP cuff yesterday BP yesterday was 131/84 Cont baby ASA  5. Abnormal fetal ultrasound Possible VSD seen on echo Has repeat 01/29/19    Preterm labor symptoms and general obstetric precautions including but not limited to vaginal bleeding, contractions, leaking of fluid and fetal movement were reviewed in detail with the patient. I discussed the assessment and treatment plan with the patient. The patient was provided an opportunity to ask questions and all were answered. The patient agreed with the plan and demonstrated an understanding of the instructions. The patient was advised to call back or seek an in-person office evaluation/go to [facility] at [facility] for any urgent or concerning symptoms. Please refer to After Visit Summary for other counseling recommendations.   I provided 15 minutes of face-to-face time during this encounter.  Return in about 2 weeks (around 02/05/2019) for high OB, in person, 3rd trim labs.  Future Appointments  Date Time Provider [place]  01/22/2019  3:45 PM Sloan Leiter, MD [place] None  02/11/2019  8:45 AM [place] 2 WH-MFCUS MFC-US  02/11/2019  8:50 AM [place] NURSE [place] MFC-US    Sloan Leiter, [place] for [place], [place]

## 2019-01-22 NOTE — Progress Notes (Signed)
Virtual ROB  Pt just received B/P cuff this week noted in Babyscripts. Also just got Blood glucose supplies and has been logging sugars   Pt has no complaints.

## 2019-01-29 ENCOUNTER — Encounter (HOSPITAL_COMMUNITY): Payer: Self-pay

## 2019-01-30 ENCOUNTER — Encounter: Payer: Self-pay | Admitting: Family Medicine

## 2019-02-05 ENCOUNTER — Encounter: Payer: Managed Care, Other (non HMO) | Admitting: Obstetrics and Gynecology

## 2019-02-06 ENCOUNTER — Encounter: Payer: Self-pay | Admitting: Family Medicine

## 2019-02-11 ENCOUNTER — Ambulatory Visit (HOSPITAL_COMMUNITY): Payer: Managed Care, Other (non HMO) | Admitting: *Deleted

## 2019-02-11 ENCOUNTER — Ambulatory Visit (HOSPITAL_COMMUNITY)
Admission: RE | Admit: 2019-02-11 | Discharge: 2019-02-11 | Disposition: A | Discharge status: Home / Self Care | Payer: Managed Care, Other (non HMO) | Source: Ambulatory Visit | Attending: Obstetrics and Gynecology | Admitting: Obstetrics and Gynecology

## 2019-02-11 ENCOUNTER — Other Ambulatory Visit: Payer: Self-pay

## 2019-02-11 ENCOUNTER — Encounter (HOSPITAL_COMMUNITY): Payer: Self-pay

## 2019-02-11 ENCOUNTER — Other Ambulatory Visit (HOSPITAL_COMMUNITY): Payer: Self-pay | Admitting: Obstetrics and Gynecology

## 2019-02-11 ENCOUNTER — Other Ambulatory Visit (HOSPITAL_COMMUNITY): Payer: Self-pay | Admitting: *Deleted

## 2019-02-11 DIAGNOSIS — O24410 Gestational diabetes mellitus in pregnancy, diet controlled: Secondary | ICD-10-CM

## 2019-02-11 DIAGNOSIS — O365931 Maternal care for other known or suspected poor fetal growth, third trimester, fetus 1: Secondary | ICD-10-CM | POA: Diagnosis not present

## 2019-02-11 DIAGNOSIS — D563 Thalassemia minor: Secondary | ICD-10-CM | POA: Diagnosis present

## 2019-02-11 DIAGNOSIS — O24913 Unspecified diabetes mellitus in pregnancy, third trimester: Secondary | ICD-10-CM | POA: Diagnosis not present

## 2019-02-11 DIAGNOSIS — Z362 Encounter for other antenatal screening follow-up: Secondary | ICD-10-CM

## 2019-02-11 DIAGNOSIS — Z3A29 29 weeks gestation of pregnancy: Secondary | ICD-10-CM

## 2019-02-11 DIAGNOSIS — O359XX0 Maternal care for (suspected) fetal abnormality and damage, unspecified, not applicable or unspecified: Secondary | ICD-10-CM

## 2019-02-11 DIAGNOSIS — O365930 Maternal care for other known or suspected poor fetal growth, third trimester, not applicable or unspecified: Secondary | ICD-10-CM | POA: Diagnosis not present

## 2019-02-11 DIAGNOSIS — O365990 Maternal care for other known or suspected poor fetal growth, unspecified trimester, not applicable or unspecified: Secondary | ICD-10-CM

## 2019-02-11 NOTE — Consult Note (Signed)
MFM Brief consultation:  Reason for consult: New IUGR Date of service 02/10/19   I met with Gloria Kemp today to review her ultrasound findings. She is currently being followed secondary to VSD, A1GDM, chronic hypertension, and today we noted new IUGR  I discussed the today's findings of new IUGR. We discussed the differential diagnosis in particular placental insufficiency and chronic hypertension. We also discussed the management to included fetal surveillance of UA dopplers, serial growth and anental testing.  Recommendations Follow up UA dopplers in 2 weeks NST in 1 week Repeat growth in 3-4 weeks. Initiate BPP at 32 weeks.   At this time Gloria Kemp had no further questions.  I spent 15 minute with >50% in face to face consultation  Vikki Ports, MD

## 2019-02-12 ENCOUNTER — Encounter: Payer: Self-pay | Admitting: Obstetrics and Gynecology

## 2019-02-12 ENCOUNTER — Ambulatory Visit (INDEPENDENT_AMBULATORY_CARE_PROVIDER_SITE_OTHER): Payer: Managed Care, Other (non HMO) | Admitting: Obstetrics and Gynecology

## 2019-02-12 ENCOUNTER — Encounter: Payer: Managed Care, Other (non HMO) | Admitting: Women's Health

## 2019-02-12 ENCOUNTER — Encounter: Payer: Self-pay | Admitting: Obstetrics

## 2019-02-12 VITALS — BP 141/86 | HR 102 | Wt 191.0 lb

## 2019-02-12 DIAGNOSIS — O10019 Pre-existing essential hypertension complicating pregnancy, unspecified trimester: Secondary | ICD-10-CM

## 2019-02-12 DIAGNOSIS — Z3A29 29 weeks gestation of pregnancy: Secondary | ICD-10-CM

## 2019-02-12 DIAGNOSIS — Z3403 Encounter for supervision of normal first pregnancy, third trimester: Secondary | ICD-10-CM

## 2019-02-12 DIAGNOSIS — O365990 Maternal care for other known or suspected poor fetal growth, unspecified trimester, not applicable or unspecified: Secondary | ICD-10-CM

## 2019-02-12 DIAGNOSIS — O99210 Obesity complicating pregnancy, unspecified trimester: Secondary | ICD-10-CM

## 2019-02-12 DIAGNOSIS — O10013 Pre-existing essential hypertension complicating pregnancy, third trimester: Secondary | ICD-10-CM

## 2019-02-12 DIAGNOSIS — O365910 Maternal care for other known or suspected poor fetal growth, first trimester, not applicable or unspecified: Secondary | ICD-10-CM

## 2019-02-12 DIAGNOSIS — O99213 Obesity complicating pregnancy, third trimester: Secondary | ICD-10-CM

## 2019-02-12 DIAGNOSIS — O283 Abnormal ultrasonic finding on antenatal screening of mother: Secondary | ICD-10-CM

## 2019-02-12 DIAGNOSIS — O365930 Maternal care for other known or suspected poor fetal growth, third trimester, not applicable or unspecified: Secondary | ICD-10-CM

## 2019-02-12 DIAGNOSIS — O24410 Gestational diabetes mellitus in pregnancy, diet controlled: Secondary | ICD-10-CM

## 2019-02-12 HISTORY — DX: Maternal care for other known or suspected poor fetal growth, unspecified trimester, not applicable or unspecified: O36.5990

## 2019-02-12 MED ORDER — MISC. DEVICES MISC: 0 refills | Status: DC

## 2019-02-12 NOTE — Progress Notes (Signed)
   PRENATAL VISIT NOTE  Subjective:  Gloria Kemp is a 23 y.o. G1P0 at [redacted]w[redacted]d being seen today for ongoing prenatal care.  She is currently monitored for the following issues for this high-risk pregnancy and has Supervision of normal first pregnancy; Alpha thalassemia silent carrier; Obesity in pregnancy; Gestational diabetes; Chronic benign essential hypertension, antepartum; Abnormal fetal ultrasound; and IUGR (intrauterine growth restriction) affecting care of mother on their problem list.  Patient reports no complaints.  Contractions: Not present. Vag. Bleeding: None.  Movement: Present. Denies leaking of fluid.   The following portions of the patient's history were reviewed and updated as appropriate: allergies, current medications, past family history, past medical history, past social history, past surgical history and problem list.   Objective:   Vitals:   02/12/19 0916  BP: (!) 141/86  Pulse: (!) 102  Weight: 191 lb (86.6 kg)    Fetal Status: Fetal Heart Rate (bpm): 150   Movement: Present     General:  Alert, oriented and cooperative. Patient is in no acute distress.  Skin: Skin is warm and dry. No rash noted.   Cardiovascular: Normal heart rate noted  Respiratory: Normal respiratory effort, no problems with respiration noted  Abdomen: Soft, gravid, appropriate for gestational age.  Pain/Pressure: Present     Pelvic: Cervical exam deferred        Extremities: Normal range of motion.  Edema: None  Mental Status: Normal mood and affect. Normal behavior. Normal judgment and thought content.   Assessment and Plan:  Pregnancy: G1P0 at [redacted]w[redacted]d  1. Encounter for supervision of normal first pregnancy in third trimester  2. Abnormal fetal ultrasound Possible VSD on echo Repeat fetal echo was normal  3. Obesity in pregnancy  4. Diet controlled gestational diabetes mellitus (GDM) in third trimester Has not been checking them regularly Encouraged her to check regularly Gave  work note saying she needs breaks  5. Chronic benign essential hypertension, antepartum Cont baby ASA Stable, no meds  Preterm labor symptoms and general obstetric precautions including but not limited to vaginal bleeding, contractions, leaking of fluid and fetal movement were reviewed in detail with the patient. Please refer to After Visit Summary for other counseling recommendations.   Return in about 2 weeks (around 02/26/2019) for high OB, in person.  Future Appointments  Date Time Provider [place]  02/26/2019 11:15 AM [place] MFC-US  02/26/2019 11:15 AM [place] 4 WH-MFCUS MFC-US  03/05/2019 10:45 AM WH-MFC NURSE WH-MFC MFC-US  03/05/2019 10:45 AM WH-MFC [place] 5 WH-MFCUS MFC-US    Sloan Leiter, MD

## 2019-02-18 ENCOUNTER — Ambulatory Visit: Payer: Managed Care, Other (non HMO)

## 2019-02-26 ENCOUNTER — Ambulatory Visit (HOSPITAL_COMMUNITY): Payer: Managed Care, Other (non HMO) | Admitting: *Deleted

## 2019-02-26 ENCOUNTER — Other Ambulatory Visit: Payer: Self-pay

## 2019-02-26 ENCOUNTER — Other Ambulatory Visit (HOSPITAL_COMMUNITY): Payer: Self-pay | Admitting: *Deleted

## 2019-02-26 ENCOUNTER — Encounter (HOSPITAL_COMMUNITY): Payer: Self-pay

## 2019-02-26 ENCOUNTER — Other Ambulatory Visit (HOSPITAL_COMMUNITY): Payer: Self-pay | Admitting: Maternal & Fetal Medicine

## 2019-02-26 ENCOUNTER — Ambulatory Visit (HOSPITAL_COMMUNITY)
Admission: RE | Admit: 2019-02-26 | Discharge: 2019-02-26 | Disposition: A | Discharge status: Home / Self Care | Payer: Managed Care, Other (non HMO) | Source: Ambulatory Visit | Attending: Obstetrics and Gynecology | Admitting: Obstetrics and Gynecology

## 2019-02-26 ENCOUNTER — Encounter: Payer: Managed Care, Other (non HMO) | Admitting: Obstetrics and Gynecology

## 2019-02-26 ENCOUNTER — Encounter: Payer: Self-pay | Admitting: Family Medicine

## 2019-02-26 DIAGNOSIS — O365990 Maternal care for other known or suspected poor fetal growth, unspecified trimester, not applicable or unspecified: Secondary | ICD-10-CM | POA: Diagnosis not present

## 2019-02-26 DIAGNOSIS — D563 Thalassemia minor: Secondary | ICD-10-CM | POA: Insufficient documentation

## 2019-02-26 DIAGNOSIS — Z3A31 31 weeks gestation of pregnancy: Secondary | ICD-10-CM

## 2019-02-26 DIAGNOSIS — O10013 Pre-existing essential hypertension complicating pregnancy, third trimester: Secondary | ICD-10-CM

## 2019-02-26 DIAGNOSIS — O365930 Maternal care for other known or suspected poor fetal growth, third trimester, not applicable or unspecified: Secondary | ICD-10-CM

## 2019-02-26 DIAGNOSIS — O24410 Gestational diabetes mellitus in pregnancy, diet controlled: Secondary | ICD-10-CM

## 2019-02-26 DIAGNOSIS — O359XX0 Maternal care for (suspected) fetal abnormality and damage, unspecified, not applicable or unspecified: Secondary | ICD-10-CM

## 2019-02-26 DIAGNOSIS — O289 Unspecified abnormal findings on antenatal screening of mother: Secondary | ICD-10-CM | POA: Diagnosis not present

## 2019-02-26 NOTE — Procedures (Addendum)
Gloria Kemp 1995-05-27 [redacted]w[redacted]d  Fetus A Non-Stress Test Interpretation for 02/26/19  Indication: IUGR, 6/8 BPP  Fetal Heart Rate A Mode: External Baseline Rate (A): 150 bpm Variability: Moderate Accelerations: 15 x 15 Decelerations: None Multiple birth?: No  Uterine Activity Mode: Toco Contraction Frequency (min): none noted  Interpretation (Fetal Testing) Nonstress Test Interpretation: Reactive Comments: FHR tracing rev'd by Dr. Donalee Citrin

## 2019-03-05 ENCOUNTER — Ambulatory Visit (HOSPITAL_COMMUNITY): Admission: RE | Admit: 2019-03-05 | Payer: Managed Care, Other (non HMO) | Source: Ambulatory Visit

## 2019-03-05 ENCOUNTER — Ambulatory Visit (HOSPITAL_COMMUNITY): Payer: Managed Care, Other (non HMO)

## 2019-03-06 ENCOUNTER — Other Ambulatory Visit: Payer: Self-pay

## 2019-03-06 ENCOUNTER — Ambulatory Visit (INDEPENDENT_AMBULATORY_CARE_PROVIDER_SITE_OTHER): Payer: Managed Care, Other (non HMO) | Admitting: Medical

## 2019-03-06 ENCOUNTER — Encounter: Payer: Self-pay | Admitting: Medical

## 2019-03-06 VITALS — BP 131/84 | HR 91 | Wt 194.6 lb

## 2019-03-06 DIAGNOSIS — O99213 Obesity complicating pregnancy, third trimester: Secondary | ICD-10-CM

## 2019-03-06 DIAGNOSIS — O365910 Maternal care for other known or suspected poor fetal growth, first trimester, not applicable or unspecified: Secondary | ICD-10-CM

## 2019-03-06 DIAGNOSIS — D563 Thalassemia minor: Secondary | ICD-10-CM

## 2019-03-06 DIAGNOSIS — O24410 Gestational diabetes mellitus in pregnancy, diet controlled: Secondary | ICD-10-CM

## 2019-03-06 DIAGNOSIS — O99210 Obesity complicating pregnancy, unspecified trimester: Secondary | ICD-10-CM

## 2019-03-06 DIAGNOSIS — Z3A32 32 weeks gestation of pregnancy: Secondary | ICD-10-CM

## 2019-03-06 DIAGNOSIS — O10013 Pre-existing essential hypertension complicating pregnancy, third trimester: Secondary | ICD-10-CM

## 2019-03-06 DIAGNOSIS — O365930 Maternal care for other known or suspected poor fetal growth, third trimester, not applicable or unspecified: Secondary | ICD-10-CM

## 2019-03-06 DIAGNOSIS — Z3403 Encounter for supervision of normal first pregnancy, third trimester: Secondary | ICD-10-CM

## 2019-03-06 DIAGNOSIS — O10019 Pre-existing essential hypertension complicating pregnancy, unspecified trimester: Secondary | ICD-10-CM

## 2019-03-06 NOTE — Patient Instructions (Addendum)
Fetal Movement Counts Patient Name: ________________________________________________ Patient Due Date: ____________________ What is a fetal movement count?  A fetal movement count is the number of times that you feel your baby move during a certain amount of time. This may also be called a fetal kick count. A fetal movement count is recommended for every pregnant woman. You may be asked to start counting fetal movements as early as week 28 of your pregnancy. Pay attention to when your baby is most active. You may notice your baby's sleep and wake cycles. You may also notice things that make your baby move more. You should do a fetal movement count:  When your baby is normally most active.  At the same time each day. A good time to count movements is while you are resting, after having something to eat and drink. How do I count fetal movements? 1. Find a quiet, comfortable area. Sit, or lie down on your side. 2. Write down the date, the start time and stop time, and the number of movements that you felt between those two times. Take this information with you to your health care visits. 3. For 2 hours, count kicks, flutters, swishes, rolls, and jabs. You should feel at least 10 movements during 2 hours. 4. You may stop counting after you have felt 10 movements. 5. If you do not feel 10 movements in 2 hours, have something to eat and drink. Then, keep resting and counting for 1 hour. If you feel at least 4 movements during that hour, you may stop counting. Contact a health care provider if:  You feel fewer than 4 movements in 2 hours.  Your baby is not moving like he or she usually does. Date: ____________ Start time: ____________ Stop time: ____________ Movements: ____________ Date: ____________ Start time: ____________ Stop time: ____________ Movements: ____________ Date: ____________ Start time: ____________ Stop time: ____________ Movements: ____________ Date: ____________ Start time:  ____________ Stop time: ____________ Movements: ____________ Date: ____________ Start time: ____________ Stop time: ____________ Movements: ____________ Date: ____________ Start time: ____________ Stop time: ____________ Movements: ____________ Date: ____________ Start time: ____________ Stop time: ____________ Movements: ____________ Date: ____________ Start time: ____________ Stop time: ____________ Movements: ____________ Date: ____________ Start time: ____________ Stop time: ____________ Movements: ____________ This information is not intended to replace advice given to you by your health care provider. Make sure you discuss any questions you have with your health care provider. Document Released: 04/11/2006 Document Revised: 04/01/2018 Document Reviewed: 04/21/2015 Elsevier Patient Education  2020 Elsevier Inc.  Ball CorporationBraxton Hicks Contractions Contractions of the uterus can occur throughout pregnancy, but they are not always a sign that you are in labor. You may have practice contractions called Braxton Hicks contractions. These false labor contractions are sometimes confused with true labor. What are Deberah PeltonBraxton Hicks contractions? Braxton Hicks contractions are tightening movements that occur in the muscles of the uterus before labor. Unlike true labor contractions, these contractions do not result in opening (dilation) and thinning of the cervix. Toward the end of pregnancy (32-34 weeks), Braxton Hicks contractions can happen more often and may become stronger. These contractions are sometimes difficult to tell apart from true labor because they can be very uncomfortable. You should not feel embarrassed if you go to the hospital with false labor. Sometimes, the only way to tell if you are in true labor is for your health care provider to look for changes in the cervix. The health care provider will do a physical exam and may monitor your contractions. If  you are not in true labor, the exam should show  that your cervix is not dilating and your water has not broken. If there are no other health problems associated with your pregnancy, it is completely safe for you to be sent home with false labor. You may continue to have Braxton Hicks contractions until you go into true labor. How to tell the difference between true labor and false labor True labor  Contractions last 30-70 seconds.  Contractions become very regular.  Discomfort is usually felt in the top of the uterus, and it spreads to the lower abdomen and low back.  Contractions do not go away with walking.  Contractions usually become more intense and increase in frequency.  The cervix dilates and gets thinner. False labor  Contractions are usually shorter and not as strong as true labor contractions.  Contractions are usually irregular.  Contractions are often felt in the front of the lower abdomen and in the groin.  Contractions may go away when you walk around or change positions while lying down.  Contractions get weaker and are shorter-lasting as time goes on.  The cervix usually does not dilate or become thin. Follow these instructions at home:   Take over-the-counter and prescription medicines only as told by your health care provider.  Keep up with your usual exercises and follow other instructions from your health care provider.  Eat and drink lightly if you think you are going into labor.  If Braxton Hicks contractions are making you uncomfortable: ? Change your position from lying down or resting to walking, or change from walking to resting. ? Sit and rest in a tub of warm water. ? Drink enough fluid to keep your urine pale yellow. Dehydration may cause these contractions. ? Do slow and deep breathing several times an hour.  Keep all follow-up prenatal visits as told by your health care provider. This is important. Contact a health care provider if:  You have a fever.  You have continuous pain in  your abdomen. Get help right away if:  Your contractions become stronger, more regular, and closer together.  You have fluid leaking or gushing from your vagina.  You pass blood-tinged mucus (bloody show).  You have bleeding from your vagina.  You have low back pain that you never had before.  You feel your baby's head pushing down and causing pelvic pressure.  Your baby is not moving inside you as much as it used to. Summary  Contractions that occur before labor are called Braxton Hicks contractions, false labor, or practice contractions.  Braxton Hicks contractions are usually shorter, weaker, farther apart, and less regular than true labor contractions. True labor contractions usually become progressively stronger and regular, and they become more frequent.  Manage discomfort from [facility] contractions by changing position, resting in a warm bath, drinking plenty of water, or practicing deep breathing. This information is not intended to replace advice given to you by your health care provider. Make sure you discuss any questions you have with your health care provider. Document Released: 07/26/2016 Document Revised: 02/22/2017 Document Reviewed: 07/26/2016 Elsevier Patient Education  2020 [employer].    Places to have your son circumcised:  [facility]     [phone]   $480 while you are in hospital         [facility]              [phone]   $269 by 4 wks                      [facility]                     [phone]   $269 by 7 days [facility]                    [phone]   $269 by 4 wks [facility]             [phone]   $225 by 2 wks    These prices sometimes change but are roughly what you can expect to pay. Please call and confirm pricing.   Circumcision is considered an elective/non-medically necessary procedure. There are many reasons parents decide to have their sons circumsized. During the first year  of life circumcised males have a reduced risk of urinary tract infections but after this year the rates between circumcised males and uncircumcised males are the same.  It is safe to have your son circumcised outside of the hospital and the places above perform them regularly.   Deciding about Circumcision in Baby Boys  (Up-to-date The Basics)  What is circumcision?   Circumcision is a surgery that removes the skin that covers the tip of the penis, called the "foreskin" Circumcision is usually done when a boy is between 46 and 4 days old. In the [place], circumcision is common. In some other countries, fewer boys are circumcised. Circumcision is a common tradition in some religions.  Should I have my baby boy circumcised?   There is no easy answer. Circumcision has some benefits. But it also has risks. After talking with your doctor, you will have to decide for yourself what is right for your family.  What are the benefits of circumcision?   Circumcised boys seem to have slightly lower rates of: ?Urinary tract infections ?Swelling of the opening at the tip of the penis Circumcised men seem to have slightly lower rates of: ?Urinary tract infections ?Swelling of the opening at the tip of the penis ?Penis cancer ?HIV and other infections that you catch during sex ?Cervical cancer in the women they have sex with Even so, in the [place], the risks of these problems are small - even in boys and men who have not been circumcised. Plus, boys and men who are not circumcised can reduce these extra risks by: ?Cleaning their penis well ?Using condoms during sex  What are the risks of circumcision?  Risks include: ?Bleeding or infection from the surgery ?Damage to or amputation of the penis ?A chance that the doctor will cut off too much or not enough of the foreskin ?A chance that sex won't feel as good later in life Only about 1 out of every 200 circumcisions leads to problems.  There is also a chance that your health insurance won't pay for circumcision.  How is circumcision done in baby boys?  First, the baby gets medicine for pain relief. This might be a cream on the skin or a shot into the base of the penis. Next, the doctor cleans the baby's penis well. Then he or she uses special tools to cut off the foreskin. Finally, the doctor  wraps a bandage (called gauze) around the baby's penis. If you have your baby circumcised, his doctor or nurse will give you instructions on how to care for him after the surgery. It is important that you follow those instructions carefully.

## 2019-03-06 NOTE — Progress Notes (Signed)
   PRENATAL VISIT NOTE  Subjective:  Gloria Kemp is a 23 y.o. G1P0 at [redacted]w[redacted]d being seen today for ongoing prenatal care.  She is currently monitored for the following issues for this high-risk pregnancy and has Supervision of normal first pregnancy; Alpha thalassemia silent carrier; Obesity in pregnancy; Gestational diabetes; Chronic benign essential hypertension, antepartum; Abnormal fetal ultrasound; and IUGR (intrauterine growth restriction) affecting care of mother on their problem list.  Patient reports fatigue.  Contractions: Not present. Vag. Bleeding: None.  Movement: Present. Denies leaking of fluid.   The following portions of the patient's history were reviewed and updated as appropriate: allergies, current medications, past family history, past medical history, past social history, past surgical history and problem list.   Objective:   Vitals:   03/06/19 0856  BP: 131/84  Pulse: 91  Weight: 194 lb 9.6 oz (88.3 kg)    Fetal Status: Fetal Heart Rate (bpm): 146   Movement: Present     General:  Alert, oriented and cooperative. Patient is in no acute distress.  Skin: Skin is warm and dry. No rash noted.   Cardiovascular: Normal heart rate noted  Respiratory: Normal respiratory effort, no problems with respiration noted  Abdomen: Soft, gravid, appropriate for gestational age.  Pain/Pressure: Absent     Pelvic: Cervical exam deferred        Extremities: Normal range of motion.  Edema: None  Mental Status: Normal mood and affect. Normal behavior. Normal judgment and thought content.   Assessment and Plan:  Pregnancy: G1P0 at [redacted]w[redacted]d 1. Encounter for supervision of normal first pregnancy in third trimester - Doing well - Patient has chosen pediatrician, unsure of name, ? [place] [facility] [place]   2. Diet controlled gestational diabetes mellitus (GDM) in third trimester - Has seen diabetes education and nutrition - Patient does not have log with her today.  Advised to bring to every visit and enter values into Baby Rx.  - Patient states fasting 75-80 AM and postprandial values 85-90 avg - CBG in office today 1.5 hours after breakfast was 98  3. Poor fetal growth affecting management of mother in first trimester, single or unspecified fetus - Last growth [place] - EFW 8% - Follow-up scheduled 03/17/19 with MFM - EFW at next visit will help determine plan for delivery   4. Obesity in pregnancy  5. Alpha thalassemia silent carrier  6. Chronic benign essential hypertension, antepartum - No medications, normotensive   Preterm labor symptoms and general obstetric precautions including but not limited to vaginal bleeding, contractions, leaking of fluid and fetal movement were reviewed in detail with the patient. Please refer to After Visit Summary for other counseling recommendations.   Return in about 2 weeks (around 03/20/2019) for [facility], Virtual.  Future Appointments  Date Time Provider [place]  03/17/2019  8:30 AM WH-MFC [place] 1 WH-MFCUS MFC-US  03/17/2019  8:40 AM [place] MFC-US  03/23/2019 10:15 AM [place] Mode, MD [place] None  03/24/2019  7:45 AM WH-MFC [place] 2 WH-MFCUS MFC-US  03/24/2019  7:50 AM WH-MFC NURSE WH-MFC MFC-US    Kerry Hough, PA-C

## 2019-03-06 NOTE — Progress Notes (Signed)
ROB.  She is not using the [employer]. She is also not recording her BS readings.

## 2019-03-17 ENCOUNTER — Ambulatory Visit (HOSPITAL_COMMUNITY): Payer: Medicaid Other

## 2019-03-18 ENCOUNTER — Encounter (HOSPITAL_COMMUNITY): Payer: Self-pay

## 2019-03-18 ENCOUNTER — Other Ambulatory Visit: Payer: Self-pay

## 2019-03-18 ENCOUNTER — Other Ambulatory Visit (HOSPITAL_COMMUNITY): Payer: Self-pay | Admitting: *Deleted

## 2019-03-18 ENCOUNTER — Ambulatory Visit (HOSPITAL_COMMUNITY): Payer: Managed Care, Other (non HMO) | Admitting: *Deleted

## 2019-03-18 ENCOUNTER — Ambulatory Visit (HOSPITAL_COMMUNITY)
Admission: RE | Admit: 2019-03-18 | Discharge: 2019-03-18 | Disposition: A | Discharge status: Home / Self Care | Payer: Managed Care, Other (non HMO) | Source: Ambulatory Visit | Attending: Maternal & Fetal Medicine | Admitting: Maternal & Fetal Medicine

## 2019-03-18 ENCOUNTER — Other Ambulatory Visit (HOSPITAL_COMMUNITY): Payer: Self-pay | Admitting: Obstetrics and Gynecology

## 2019-03-18 DIAGNOSIS — Z362 Encounter for other antenatal screening follow-up: Secondary | ICD-10-CM | POA: Diagnosis not present

## 2019-03-18 DIAGNOSIS — O359XX0 Maternal care for (suspected) fetal abnormality and damage, unspecified, not applicable or unspecified: Secondary | ICD-10-CM

## 2019-03-18 DIAGNOSIS — O365930 Maternal care for other known or suspected poor fetal growth, third trimester, not applicable or unspecified: Secondary | ICD-10-CM | POA: Diagnosis not present

## 2019-03-18 DIAGNOSIS — D563 Thalassemia minor: Secondary | ICD-10-CM | POA: Diagnosis present

## 2019-03-18 DIAGNOSIS — O365990 Maternal care for other known or suspected poor fetal growth, unspecified trimester, not applicable or unspecified: Secondary | ICD-10-CM

## 2019-03-18 DIAGNOSIS — O24410 Gestational diabetes mellitus in pregnancy, diet controlled: Secondary | ICD-10-CM | POA: Diagnosis not present

## 2019-03-18 DIAGNOSIS — O10013 Pre-existing essential hypertension complicating pregnancy, third trimester: Secondary | ICD-10-CM | POA: Diagnosis not present

## 2019-03-18 DIAGNOSIS — Z3A34 34 weeks gestation of pregnancy: Secondary | ICD-10-CM

## 2019-03-18 NOTE — Procedures (Signed)
Gloria Kemp 11-16-1995 [redacted]w[redacted]d  Fetus A Non-Stress Test Interpretation for 03/18/19  Indication: IUGR  Fetal Heart Rate A Mode: External Baseline Rate (A): 145 bpm Variability: Moderate Accelerations: 15 x 15 Decelerations: Variable Multiple birth?: No  Uterine Activity Mode: Palpation, Toco Contraction Frequency (min): x1 Contraction Duration (sec): 40 Contraction Quality: Mild Resting Tone Palpated: Relaxed Resting Time: Adequate  Interpretation (Fetal Testing) Nonstress Test Interpretation: Reactive Overall Impression: Reassuring for gestational age Comments: Reviewed tracing with Dr. Donalee Citrin

## 2019-03-23 ENCOUNTER — Telehealth (INDEPENDENT_AMBULATORY_CARE_PROVIDER_SITE_OTHER): Payer: Medicaid Other | Admitting: Obstetrics & Gynecology

## 2019-03-23 ENCOUNTER — Encounter: Payer: Self-pay | Admitting: Obstetrics & Gynecology

## 2019-03-23 DIAGNOSIS — Z3A35 35 weeks gestation of pregnancy: Secondary | ICD-10-CM

## 2019-03-23 DIAGNOSIS — Z3400 Encounter for supervision of normal first pregnancy, unspecified trimester: Secondary | ICD-10-CM

## 2019-03-23 DIAGNOSIS — D563 Thalassemia minor: Secondary | ICD-10-CM

## 2019-03-23 DIAGNOSIS — O24419 Gestational diabetes mellitus in pregnancy, unspecified control: Secondary | ICD-10-CM

## 2019-03-23 NOTE — Progress Notes (Signed)
   PRENATAL VISIT NOTE  Subjective:  Gloria Kemp is a 23 y.o. G1P0 at [redacted]w[redacted]d being seen today for ongoing prenatal care.  She is currently monitored for the following issues for this high-risk pregnancy and has Supervision of normal first pregnancy; Alpha thalassemia silent carrier; Obesity in pregnancy; Gestational diabetes; Chronic benign essential hypertension, antepartum; Abnormal fetal ultrasound; and IUGR (intrauterine growth restriction) affecting care of mother on their problem list.  Patient reports no complaints.  Contractions: Irritability. Vag. Bleeding: None.  Movement: Present. Denies leaking of fluid.   The following portions of the patient's history were reviewed and updated as appropriate: allergies, current medications, past family history, past medical history, past social history, past surgical history and problem list.   Objective:   Vitals:   03/23/19 1031  BP: (!) 139/94    Fetal Status:     Movement: Present     General:  Alert, oriented and cooperative. Patient is in no acute distress.  Skin: Skin is warm and dry. No rash noted.   Cardiovascular: Normal heart rate noted  Respiratory: Normal respiratory effort, no problems with respiration noted  Abdomen: Soft, gravid, appropriate for gestational age.  Pain/Pressure: Absent     Pelvic: Cervical exam deferred        Extremities: Normal range of motion.  Edema: None  Mental Status: Normal mood and affect. Normal behavior. Normal judgment and thought content.   Assessment and Plan:  Pregnancy: G1P0 at [redacted]w[redacted]d 1. Gestational diabetes mellitus (GDM), antepartum, gestational diabetes method of control unspecified States FBS and PP are all in range , diet controlled  2. Alpha thalassemia silent carrier   3. Supervision of normal first pregnancy, antepartum IUGR, has [place] f/u and plan delivery 37-39 weeks  Preterm labor symptoms and general obstetric precautions including but not limited to vaginal bleeding,  contractions, leaking of fluid and fetal movement were reviewed in detail with the patient. Please refer to After Visit Summary for other counseling recommendations.   No follow-ups on file.  Future Appointments  Date Time Provider [place]  03/24/2019  7:45 AM [place] MFC-US  03/24/2019  7:45 AM WH-MFC [place] 2 WH-MFCUS MFC-US  03/24/2019  8:45 AM WH-MFC NST [place] MFC-US  04/01/2019  9:45 AM WH-MFC NURSE WH-MFC MFC-US  04/01/2019  9:45 AM WH-MFC [place] 4 WH-MFCUS MFC-US  04/08/2019  1:15 PM WH-MFC NURSE WH-MFC MFC-US  04/08/2019  1:15 PM [place] 4 WH-MFCUS MFC-US    Emeterio Reeve, MD

## 2019-03-23 NOTE — Progress Notes (Signed)
Virtual ROB   CC: Nose Bleeds,no other concerns.

## 2019-03-23 NOTE — Patient Instructions (Signed)

## 2019-03-24 ENCOUNTER — Ambulatory Visit (HOSPITAL_COMMUNITY): Payer: Managed Care, Other (non HMO) | Admitting: *Deleted

## 2019-03-24 ENCOUNTER — Other Ambulatory Visit: Payer: Self-pay

## 2019-03-24 ENCOUNTER — Ambulatory Visit (HOSPITAL_COMMUNITY)
Admission: RE | Admit: 2019-03-24 | Discharge: 2019-03-24 | Disposition: A | Discharge status: Home / Self Care | Payer: Managed Care, Other (non HMO) | Source: Ambulatory Visit | Attending: Obstetrics and Gynecology | Admitting: Obstetrics and Gynecology

## 2019-03-24 ENCOUNTER — Other Ambulatory Visit (HOSPITAL_COMMUNITY): Payer: Self-pay | Admitting: Obstetrics and Gynecology

## 2019-03-24 ENCOUNTER — Encounter (HOSPITAL_COMMUNITY): Payer: Self-pay

## 2019-03-24 VITALS — BP 146/94

## 2019-03-24 DIAGNOSIS — O365990 Maternal care for other known or suspected poor fetal growth, unspecified trimester, not applicable or unspecified: Secondary | ICD-10-CM | POA: Insufficient documentation

## 2019-03-24 DIAGNOSIS — O359XX0 Maternal care for (suspected) fetal abnormality and damage, unspecified, not applicable or unspecified: Secondary | ICD-10-CM

## 2019-03-24 DIAGNOSIS — O365930 Maternal care for other known or suspected poor fetal growth, third trimester, not applicable or unspecified: Secondary | ICD-10-CM | POA: Insufficient documentation

## 2019-03-24 DIAGNOSIS — D563 Thalassemia minor: Secondary | ICD-10-CM | POA: Insufficient documentation

## 2019-03-24 DIAGNOSIS — O24410 Gestational diabetes mellitus in pregnancy, diet controlled: Secondary | ICD-10-CM | POA: Diagnosis not present

## 2019-03-24 DIAGNOSIS — O10013 Pre-existing essential hypertension complicating pregnancy, third trimester: Secondary | ICD-10-CM | POA: Diagnosis not present

## 2019-03-24 DIAGNOSIS — Z3A35 35 weeks gestation of pregnancy: Secondary | ICD-10-CM

## 2019-03-24 NOTE — Procedures (Signed)
Gloria Kemp 1995-08-08 [redacted]w[redacted]d  Fetus A Non-Stress Test Interpretation for 03/24/19  Indication: IUGR, BPP 6/8, HTN  Fetal Heart Rate A Mode: External Baseline Rate (A): 135 bpm Variability: Moderate Accelerations: 15 x 15 Decelerations: None Multiple birth?: No  Uterine Activity Mode: Palpation, Toco Contraction Frequency (min): UI Contraction Quality: Mild Resting Tone Palpated: Relaxed Resting Time: Adequate  Interpretation (Fetal Testing) Nonstress Test Interpretation: Reactive Comments: EFM tracing and F/U BP's reviewed by Dr. Donalee Citrin

## 2019-03-27 NOTE — L&D Delivery Note (Addendum)
OB/GYN Faculty Practice Delivery Note  Gloria Kemp is a 24 y.o. G1P0 s/p induced vaginal at [redacted]w[redacted]d. She was admitted for IOL for FGR 3%, cHTN, A1GDM.   GBS Status: --/Positive (01/05 1104) Maximum Maternal Temperature: Temp (24hrs), Avg:98.9 F (37.2 C), Min:97.6 F (36.4 C), Max:100.1 F (37.8 C)  Labor Progress: Admitted for IOL, initially with foley bulb and pitocin AROM 4h 55m prior to delivery with clear fluid  Pitocin to 67mu/min, then decreased to 35mu/min 2.5 hours for variable decels, then turned off and given terbutaline.  Complete dilation achieved   Delivery Date/Time: 04/05/2019 at 2031 Delivery: Called to room and patient was complete and pushing. Head delivered LOA with.  No nuchal cord present. .  Left arm noted to cross chest with hand tucked under right chin. Shoulder and body delivered in usual fashion. Infant with spontaneous cry, placed skin to skin on mother's abdomen, dried and stimulated. Cord clamped x 2 after 1-minute delay, and cut by grandmother of baby under my direct supervision. Cord blood drawn. Placenta delivered spontaneously with gentle cord traction. Fundus firm with massage and Pitocin. Labia, perineum, vagina, and cervix inspected with  no lacerations .   Placenta: spontaneous , intact  EBL: 150 mL  Analgesia: IV medication  Postpartum Planning Mom and baby to mother/baby.  Lactation consult Contraception condoms  Circ outpt  Social work: no   Infant: Viable female  APGAR: 8/9  Weight: 2129 grams  Gloria Kemp, M.D.  04/05/2019 8:55 PM   OB FELLOW ATTESTATION  I was present, gloved, and supervising throughout delivery and have edited the above note to reflect any changes or updates.  Gloria Seal, MD/[facility] OB Fellow  04/05/2019, 10:16 PM

## 2019-03-31 ENCOUNTER — Other Ambulatory Visit: Payer: Self-pay | Admitting: Advanced Practice Midwife

## 2019-03-31 ENCOUNTER — Inpatient Hospital Stay (HOSPITAL_COMMUNITY)
Admission: AD | Admit: 2019-03-31 | Discharge: 2019-03-31 | Disposition: A | Discharge status: Home / Self Care | Payer: Managed Care, Other (non HMO) | Attending: Family Medicine | Admitting: Family Medicine

## 2019-03-31 ENCOUNTER — Encounter (HOSPITAL_COMMUNITY): Payer: Self-pay | Admitting: Family Medicine

## 2019-03-31 ENCOUNTER — Other Ambulatory Visit: Payer: Self-pay

## 2019-03-31 ENCOUNTER — Ambulatory Visit (INDEPENDENT_AMBULATORY_CARE_PROVIDER_SITE_OTHER): Payer: Medicaid Other | Admitting: Obstetrics & Gynecology

## 2019-03-31 ENCOUNTER — Other Ambulatory Visit (HOSPITAL_COMMUNITY)
Admission: RE | Admit: 2019-03-31 | Discharge: 2019-03-31 | Disposition: A | Discharge status: Home / Self Care | Payer: Managed Care, Other (non HMO) | Source: Ambulatory Visit | Attending: Obstetrics & Gynecology | Admitting: Obstetrics & Gynecology

## 2019-03-31 VITALS — BP 153/105 | HR 116 | Wt 193.2 lb

## 2019-03-31 DIAGNOSIS — O10013 Pre-existing essential hypertension complicating pregnancy, third trimester: Secondary | ICD-10-CM | POA: Diagnosis not present

## 2019-03-31 DIAGNOSIS — O10019 Pre-existing essential hypertension complicating pregnancy, unspecified trimester: Secondary | ICD-10-CM

## 2019-03-31 DIAGNOSIS — I10 Essential (primary) hypertension: Secondary | ICD-10-CM | POA: Diagnosis present

## 2019-03-31 DIAGNOSIS — O24419 Gestational diabetes mellitus in pregnancy, unspecified control: Secondary | ICD-10-CM | POA: Insufficient documentation

## 2019-03-31 DIAGNOSIS — Z3A36 36 weeks gestation of pregnancy: Secondary | ICD-10-CM | POA: Insufficient documentation

## 2019-03-31 DIAGNOSIS — Z833 Family history of diabetes mellitus: Secondary | ICD-10-CM | POA: Insufficient documentation

## 2019-03-31 DIAGNOSIS — O10919 Unspecified pre-existing hypertension complicating pregnancy, unspecified trimester: Secondary | ICD-10-CM

## 2019-03-31 DIAGNOSIS — Z7982 Long term (current) use of aspirin: Secondary | ICD-10-CM | POA: Diagnosis not present

## 2019-03-31 DIAGNOSIS — Z3689 Encounter for other specified antenatal screening: Secondary | ICD-10-CM

## 2019-03-31 DIAGNOSIS — Z8249 Family history of ischemic heart disease and other diseases of the circulatory system: Secondary | ICD-10-CM | POA: Insufficient documentation

## 2019-03-31 DIAGNOSIS — D563 Thalassemia minor: Secondary | ICD-10-CM

## 2019-03-31 DIAGNOSIS — Z3493 Encounter for supervision of normal pregnancy, unspecified, third trimester: Secondary | ICD-10-CM

## 2019-03-31 HISTORY — DX: Essential (primary) hypertension: I10

## 2019-03-31 LAB — COMPREHENSIVE METABOLIC PANEL
ALT: 21 U/L (ref 0–44)
AST: 23 U/L (ref 15–41)
Albumin: 3.3 g/dL — ABNORMAL LOW (ref 3.5–5.0)
Alkaline Phosphatase: 149 U/L — ABNORMAL HIGH (ref 38–126)
Anion gap: 12 (ref 5–15)
BUN: 7 mg/dL (ref 6–20)
CO2: 18 mmol/L — ABNORMAL LOW (ref 22–32)
Calcium: 9.7 mg/dL (ref 8.9–10.3)
Chloride: 105 mmol/L (ref 98–111)
Creatinine, Ser: 0.68 mg/dL (ref 0.44–1.00)
GFR calc Af Amer: >60 mL/min (ref >60)
GFR calc non Af Amer: >60 mL/min (ref >60)
Glucose, Bld: 86 mg/dL (ref 70–99)
Potassium: 4.4 mmol/L (ref 3.5–5.1)
Sodium: 135 mmol/L (ref 135–145)
Total Bilirubin: 0.5 mg/dL (ref 0.3–1.2)
Total Protein: 7 g/dL (ref 6.5–8.1)

## 2019-03-31 LAB — PROTEIN / CREATININE RATIO, URINE
Creatinine, Urine: 317.82 mg/dL
Protein Creatinine Ratio: 0.12 mg/mg{Cre} (ref 0.00–0.15)
Total Protein, Urine: 38 mg/dL

## 2019-03-31 LAB — CBC
HCT: 43.4 % (ref 36.0–46.0)
Hemoglobin: 14.8 g/dL (ref 12.0–15.0)
MCH: 27.6 pg (ref 26.0–34.0)
MCHC: 34.1 g/dL (ref 30.0–36.0)
MCV: 80.8 fL (ref 80.0–100.0)
Platelets: 190 10*3/uL (ref 150–400)
RBC: 5.37 MIL/uL — ABNORMAL HIGH (ref 3.87–5.11)
RDW: 14 % (ref 11.5–15.5)
WBC: 7.8 10*3/uL (ref 4.0–10.5)
nRBC: 0 % (ref 0.0–0.2)

## 2019-03-31 MED ORDER — LABETALOL HCL 100 MG PO TABS: 200.0000 mg | ORAL_TABLET | Freq: Two times a day (BID) | ORAL | 3 refills | Status: DC

## 2019-03-31 NOTE — MAU Provider Note (Signed)
History     CSN: [id number]  Arrival date and time: 03/31/19 1110   First Provider Initiated Contact with Patient 03/31/19 1148      Chief Complaint  Patient presents with  . Hypertension   HPI Gloria Kemp is a 24 y.o. G1P0 at 85w2dwho presents to MAU from clinic for evaluation of elevated blood pressures in the setting of Chronic Hypertension, not currently on medication. She denies headache, visual disturbances, RUQ/epigastric pain, new onset swelling or weight gain. She also denies vaginal bleeding, leaking of fluid, decreased fetal movement, fever, falls, or recent illness.   Patient's pregnancy is also complicated by AE0FHQand FGR.  OB History    Gravida  1   Para      Term      Preterm      AB      Living        SAB      TAB      Ectopic      Multiple      Live Births              Past Medical History:  Diagnosis Date  . Gestational diabetes   . Hypertension   . Medical history non-contributory     Past Surgical History:  Procedure Laterality Date  . ADENOIDECTOMY    . EYE SURGERY      Family History  Problem Relation Age of Onset  . Hypertension Mother   . Obesity Mother   . Diabetes Father   . Hearing loss Father   . Hypertension Father   . Obesity Father   . Stroke Paternal Aunt   . Kidney disease Paternal Uncle   . Hypertension Maternal Grandmother   . Obesity Maternal Grandmother   . Cancer Maternal Grandfather   . Arthritis Paternal Grandmother   . Diabetes Paternal Grandmother   . Hypertension Paternal Grandmother   . Miscarriages / Stillbirths Paternal Grandmother   . Obesity Paternal Grandmother     Social History   Tobacco Use  . Smoking status: Never Smoker  . Smokeless tobacco: Never Used  Substance Use Topics  . Alcohol use: Not Currently  . Drug use: Never    Allergies: No Known Allergies  Medications Prior to Admission  Medication Sig Dispense Refill Last Dose  . aspirin 81 MG chewable tablet Chew 1  tablet (81 mg total) by mouth daily. 90 tablet 3 03/31/2019 at Unknown time  . Prenatal Vit-Fe Fumarate-FA (MULTIVITAMIN-PRENATAL) 27-0.8 MG TABS tablet Take 1 tablet by mouth daily at 12 noon.   03/31/2019 at Unknown time  . Accu-Chek FastClix Lancets MISC 1 Units by Percutaneous route 4 (four) times daily. 100 each 12   . Blood Glucose Monitoring Suppl (ACCU-CHEK GUIDE) w/Device KIT 1 Device by Does not apply route 4 (four) times daily. 1 kit 0   . Blood Pressure Monitor KIT 1 Device by Does not apply route once a week. To be monitored Regularly at home. 1 kit 0   . Blood Pressure Monitoring KIT 1 kit by Does not apply route once a week. 1 kit 0   . Elastic Bandages & Supports (COMFORT FIT MATERNITY SUPP SM) MISC Wear as directed. (Patient not taking: Reported on 01/08/2019) 1 each 0   . glucose blood (ACCU-CHEK GUIDE) test strip Use to check blood sugars four times a day was instructed 50 each 12   . Misc. Devices MISC Dispense one maternity belt for patient 1 each 0  Review of Systems  Eyes: Negative for visual disturbance.  Respiratory: Negative for shortness of breath.   Gastrointestinal: Negative for abdominal pain.  Genitourinary: Negative for vaginal bleeding, vaginal discharge and vaginal pain.  Musculoskeletal: Negative for back pain.  Neurological: Negative for dizziness, syncope, weakness and headaches.  All other systems reviewed and are negative.  Physical Exam   Blood pressure (!) 141/93, pulse (!) 105, temperature 98.2 F (36.8 C), temperature source Oral, resp. rate 18, height 5' 2"  (1.575 m), weight 87.6 kg, last menstrual period 07/09/2018, SpO2 100 %.  Physical Exam  Nursing note and vitals reviewed. Constitutional: She is oriented to person, place, and time. She appears well-developed and well-nourished.  Cardiovascular: Normal rate.  Respiratory: Effort normal and breath sounds normal. She has no decreased breath sounds.  GI: Soft. She exhibits no distension. There  is no abdominal tenderness. There is no rebound, no guarding and no CVA tenderness.  Gravid  Neurological: She is alert and oriented to person, place, and time.  Skin: Skin is warm and dry.  Psychiatric: She has a normal mood and affect. Her behavior is normal. Judgment and thought content normal.    MAU Course/MDM  Procedures   --Elevated blood pressures in MAU today. I blood pressure meets criteria for severe range by 1, no second severe range and therefore not treated per MAU policy. No severe symptoms --Prenatal notes including MFM visits reviewed. Patient's blood pressure today consistent with blood pressures observed during MFM visit 03/24/2019. Dr. Fritz Pickerel notes regarding initiation of antihypertensives reviewed --Patient previously advised by Dr. Donalee Citrin to consider IOL during 37th week due to FGR. Patient previously hesitant but amenable to proceeding with IOL on that timeline as of today's visit. --Discussed with Dr. Nehemiah Settle. Given previous recommendation and initiation of medication for Chronic Hypertension, IOL scheduled for 04/05/2019 at 37.0 weeks. Orders placed for cervical ripening with Cytotec --Reactive tracing: baseline 140, mod var, + accels, no decels --Toco: UI with rare contraction --Cervix FT in clinic this morning  Patient Vitals for the past 24 hrs:  BP Temp Temp src Pulse Resp SpO2 Height Weight  03/31/19 1412 (!) 143/89 -- -- -- -- -- -- --  03/31/19 1346 (!) 143/89 -- -- (!) 105 -- -- -- --  03/31/19 1331 (!) 139/92 -- -- (!) 107 -- -- -- --  03/31/19 1316 (!) 141/93 -- -- (!) 105 -- -- -- --  03/31/19 1301 136/85 -- -- (!) 106 -- -- -- --  03/31/19 1246 (!) 143/88 -- -- (!) 102 -- -- -- --  03/31/19 1231 (!) 150/84 -- -- (!) 114 -- -- -- --  03/31/19 1221 (!) 147/103 -- -- (!) 116 -- -- -- --  03/31/19 1202 (!) 154/103 -- -- (!) 107 -- -- -- --  03/31/19 1201 (!) 154/103 -- -- (!) 107 -- -- -- --  03/31/19 1146 -- -- -- -- -- 100 % -- --  03/31/19 1141 (!)  161/99 -- -- (!) 106 -- 100 % -- --  03/31/19 1121 (!) 156/99 98.2 F (36.8 C) Oral (!) 111 18 100 % 5' 2"  (1.575 m) 87.6 kg   Results for orders placed or performed during the hospital encounter of 03/31/19 (from the past 24 hour(s))  CBC     Status: Abnormal   Collection Time: 03/31/19 11:37 AM  Result Value Ref Range   WBC 7.8 4.0 - 10.5 K/uL   RBC 5.37 (H) 3.87 - 5.11 MIL/uL   Hemoglobin 14.8 12.0 -  15.0 g/dL   HCT 43.4 36.0 - 46.0 %   MCV 80.8 80.0 - 100.0 fL   MCH 27.6 26.0 - 34.0 pg   MCHC 34.1 30.0 - 36.0 g/dL   RDW 14.0 11.5 - 15.5 %   Platelets 190 150 - 400 K/uL   nRBC 0.0 0.0 - 0.2 %  Comprehensive metabolic panel     Status: Abnormal   Collection Time: 03/31/19 11:37 AM  Result Value Ref Range   Sodium 135 135 - 145 mmol/L   Potassium 4.4 3.5 - 5.1 mmol/L   Chloride 105 98 - 111 mmol/L   CO2 18 (L) 22 - 32 mmol/L   Glucose, Bld 86 70 - 99 mg/dL   BUN 7 6 - 20 mg/dL   Creatinine, Ser 0.68 0.44 - 1.00 mg/dL   Calcium 9.7 8.9 - 10.3 mg/dL   Total Protein 7.0 6.5 - 8.1 g/dL   Albumin 3.3 (L) 3.5 - 5.0 g/dL   AST 23 15 - 41 U/L   ALT 21 0 - 44 U/L   Alkaline Phosphatase 149 (H) 38 - 126 U/L   Total Bilirubin 0.5 0.3 - 1.2 mg/dL   GFR calc non Af Amer >60 >60 mL/min   GFR calc Af Amer >60 >60 mL/min   Anion gap 12 5 - 15  Protein / creatinine ratio, urine     Status: None   Collection Time: 03/31/19 12:00 PM  Result Value Ref Range   Creatinine, Urine 317.82 mg/dL   Total Protein, Urine 38 mg/dL   Protein Creatinine Ratio 0.12 0.00 - 0.15 mg/mg[Cre]   Meds ordered this encounter  Medications  . labetalol (NORMODYNE) 100 MG tablet    Sig: Take 2 tablets (200 mg total) by mouth 2 (two) times daily.    Dispense:  30 tablet    Refill:  3    Order Specific Question:   Supervising Provider    Answer:   Donnamae Jude [[id number]]   Assessment and Plan  --24 y.o. G1P0 at [redacted]w[redacted]d --Reactive tracing --Chronic HTN, normal PEC labs --Initiate Labetalol 200  BID --Discharge home in stable condition   F/U: --MFM tomorrow 04/01/2019  --IOL midnight 04/05/2019 --Patient is aware that if MFM deems it safe she can coordinate change in induction date and reschedule IOL PRN  SDarlina Rumpf CNM 03/31/2019, 2:40 PM

## 2019-03-31 NOTE — Progress Notes (Signed)
IOL for FGR at 37.0, per MFM note 03/18/2019  Clayton Bibles, MSN, CNM Certified Nurse Midwife, [facility] for [employer], [facility] 03/31/19 2:22 PM

## 2019-03-31 NOTE — Progress Notes (Signed)
Pt is here for ROB. [redacted]w[redacted]d.

## 2019-03-31 NOTE — Discharge Instructions (Signed)

## 2019-03-31 NOTE — MAU Note (Signed)
Pt was sent from office for pre-eclampsia work up d/t elevated blood pressures.  She has CHTN. She denies contractions, LOF, VB. +FM. Denies vision changes, RUQ pain, or increased edema.

## 2019-03-31 NOTE — Patient Instructions (Signed)
Preeclampsia and Eclampsia Preeclampsia is a serious condition that may develop during pregnancy. This condition causes high blood pressure and increased protein in your urine along with other symptoms, such as headaches and vision changes. These symptoms may develop as the condition gets worse. Preeclampsia may occur at 20 weeks of pregnancy or later. Diagnosing and treating preeclampsia early is very important. If not treated early, it can cause serious problems for you and your baby. One problem it can lead to is eclampsia. Eclampsia is a condition that causes muscle jerking or shaking (convulsions or seizures) and other serious problems for the mother. During pregnancy, delivering your baby may be the best treatment for preeclampsia or eclampsia. For most women, preeclampsia and eclampsia symptoms go away after giving birth. In rare cases, a woman may develop preeclampsia after giving birth (postpartum preeclampsia). This usually occurs within 48 hours after childbirth but may occur up to 6 weeks after giving birth. What are the causes? The cause of preeclampsia is not known. What increases the risk? The following risk factors make you more likely to develop preeclampsia:  Being pregnant for the first time.  Having had preeclampsia during a past pregnancy.  Having a family history of preeclampsia.  Having high blood pressure.  Being pregnant with more than one baby.  Being 35 or older.  Being African-American.  Having kidney disease or diabetes.  Having medical conditions such as lupus or blood diseases.  Being very overweight (obese). What are the signs or symptoms? The most common symptoms are:  Severe headaches.  Vision problems, such as blurred or double vision.  Abdominal pain, especially upper abdominal pain. Other symptoms that may develop as the condition gets worse include:  Sudden weight gain.  Sudden swelling of the hands, face, legs, and feet.  Severe nausea  and vomiting.  Numbness in the face, arms, legs, and feet.  Dizziness.  Urinating less than usual.  Slurred speech.  Convulsions or seizures. How is this diagnosed? There are no screening tests for preeclampsia. Your health care provider will ask you about symptoms and check for signs of preeclampsia during your prenatal visits. You may also have tests that include:  Checking your blood pressure.  Urine tests to check for protein. Your health care provider will check for this at every prenatal visit.  Blood tests.  Monitoring your baby's heart rate.  Ultrasound. How is this treated? You and your health care provider will determine the treatment approach that is best for you. Treatment may include:  Having more frequent prenatal exams to check for signs of preeclampsia, if you have an increased risk for preeclampsia.  Medicine to lower your blood pressure.  Staying in the hospital, if your condition is severe. There, treatment will focus on controlling your blood pressure and the amount of fluids in your body (fluid retention).  Taking medicine (magnesium sulfate) to prevent seizures. This may be given as an injection or through an IV.  Taking a low-dose aspirin during your pregnancy.  Delivering your baby early. You may have your labor started with medicine (induced), or you may have a cesarean delivery. Follow these instructions at home: Eating and drinking   Drink enough fluid to keep your urine pale yellow.  Avoid caffeine. Lifestyle  Do not use any products that contain nicotine or tobacco, such as cigarettes and e-cigarettes. If you need help quitting, ask your health care provider.  Do not use alcohol or drugs.  Avoid stress as much as possible. Rest and get   plenty of sleep. General instructions  Take over-the-counter and prescription medicines only as told by your health care provider.  When lying down, lie on your left side. This keeps pressure off your  major blood vessels.  When sitting or lying down, raise (elevate) your feet. Try putting some pillows underneath your lower legs.  Exercise regularly. Ask your health care provider what kinds of exercise are best for you.  Keep all follow-up and prenatal visits as told by your health care provider. This is important. How is this prevented? There is no known way of preventing preeclampsia or eclampsia from developing. However, to lower your risk of complications and detect problems early:  Get regular prenatal care. Your health care provider may be able to diagnose and treat the condition early.  Maintain a healthy weight. Ask your health care provider for help managing weight gain during pregnancy.  Work with your health care provider to manage any long-term (chronic) health conditions you have, such as diabetes or kidney problems.  You may have tests of your blood pressure and kidney function after giving birth.  Your health care provider may have you take low-dose aspirin during your next pregnancy. Contact a health care provider if:  You have symptoms that your health care provider told you may require more treatment or monitoring, such as: ? Headaches. ? Nausea or vomiting. ? Abdominal pain. ? Dizziness. ? Light-headedness. Get help right away if:  You have severe: ? Abdominal pain. ? Headaches that do not get better. ? Dizziness. ? Vision problems. ? Confusion. ? Nausea or vomiting.  You have any of the following: ? A seizure. ? Sudden, rapid weight gain. ? Sudden swelling in your hands, ankles, or face. ? Trouble moving any part of your body. ? Numbness in any part of your body. ? Trouble speaking. ? Abnormal bleeding.  You faint. Summary  Preeclampsia is a serious condition that may develop during pregnancy.  This condition causes high blood pressure and increased protein in your urine along with other symptoms, such as headaches and vision  changes.  Diagnosing and treating preeclampsia early is very important. If not treated early, it can cause serious problems for you and your baby.  Get help right away if you have symptoms that your health care provider told you to watch for. This information is not intended to replace advice given to you by your health care provider. Make sure you discuss any questions you have with your health care provider. Document Revised: 11/12/2017 Document Reviewed: 10/17/2015 Elsevier Patient Education  2020 [employer]  

## 2019-03-31 NOTE — Progress Notes (Signed)
   PRENATAL VISIT NOTE  Subjective:  Gloria Kemp is a 24 y.o. G1P0 at [redacted]w[redacted]d being seen today for ongoing prenatal care.  She is currently monitored for the following issues for this high-risk pregnancy and has Supervision of normal first pregnancy; Alpha thalassemia silent carrier; Obesity in pregnancy; Gestational diabetes; Chronic benign essential hypertension, antepartum; Abnormal fetal ultrasound; and IUGR (intrauterine growth restriction) affecting care of mother on their problem list.  Patient reports no complaints.  Contractions: Not present. Vag. Bleeding: None.  Movement: Present. Denies leaking of fluid.   The following portions of the patient's history were reviewed and updated as appropriate: allergies, current medications, past family history, past medical history, past social history, past surgical history and problem list.   Objective:   Vitals:   03/31/19 1006 03/31/19 1009  BP: (!) 155/107 (!) 153/105  Pulse: (!) 111 (!) 116  Weight: 193 lb 3.2 oz (87.6 kg)     Fetal Status: Fetal Heart Rate (bpm): 150   Movement: Present  Presentation: Vertex  General:  Alert, oriented and cooperative. Patient is in no acute distress.  Skin: Skin is warm and dry. No rash noted.   Cardiovascular: Normal heart rate noted  Respiratory: Normal respiratory effort, no problems with respiration noted  Abdomen: Soft, gravid, appropriate for gestational age.  Pain/Pressure: Absent     Pelvic: Cervical exam performed Dilation: Fingertip Effacement (%): 40 Station: Ballotable  Extremities: Normal range of motion.  Edema: None  Mental Status: Normal mood and affect. Normal behavior. Normal judgment and thought content.   Assessment and Plan:  Pregnancy: G1P0 at [redacted]w[redacted]d There are no diagnoses linked to this encounter. Preterm labor symptoms and general obstetric precautions including but not limited to vaginal bleeding, contractions, leaking of fluid and fetal movement were reviewed in detail  with the patient. Please refer to After Visit Summary for other counseling recommendations.  Evaluation at MAU recommended for preeclampsia with elevated BP today Return in about 1 week (around 04/07/2019).  Future Appointments  Date Time Provider Department Center  04/01/2019  9:45 AM WH-MFC NURSE WH-MFC MFC-US  04/01/2019  9:45 AM WH-MFC [place] 4 WH-MFCUS MFC-US  04/08/2019  1:15 PM WH-MFC NURSE WH-MFC MFC-US  04/08/2019  1:15 PM WH-MFC [place] 4 WH-MFCUS MFC-US    Scheryl Darter, MD

## 2019-04-01 ENCOUNTER — Ambulatory Visit (HOSPITAL_COMMUNITY)
Admission: RE | Admit: 2019-04-01 | Discharge: 2019-04-01 | Disposition: A | Discharge status: Home / Self Care | Payer: Managed Care, Other (non HMO) | Source: Ambulatory Visit | Attending: Obstetrics and Gynecology | Admitting: Obstetrics and Gynecology

## 2019-04-01 ENCOUNTER — Encounter (HOSPITAL_COMMUNITY): Payer: Self-pay | Admitting: *Deleted

## 2019-04-01 ENCOUNTER — Telehealth (HOSPITAL_COMMUNITY): Payer: Self-pay | Admitting: *Deleted

## 2019-04-01 ENCOUNTER — Encounter (HOSPITAL_COMMUNITY): Payer: Self-pay

## 2019-04-01 ENCOUNTER — Ambulatory Visit (HOSPITAL_COMMUNITY): Payer: Managed Care, Other (non HMO) | Admitting: *Deleted

## 2019-04-01 DIAGNOSIS — O365930 Maternal care for other known or suspected poor fetal growth, third trimester, not applicable or unspecified: Secondary | ICD-10-CM

## 2019-04-01 DIAGNOSIS — O24410 Gestational diabetes mellitus in pregnancy, diet controlled: Secondary | ICD-10-CM | POA: Insufficient documentation

## 2019-04-01 DIAGNOSIS — O359XX0 Maternal care for (suspected) fetal abnormality and damage, unspecified, not applicable or unspecified: Secondary | ICD-10-CM

## 2019-04-01 DIAGNOSIS — D563 Thalassemia minor: Secondary | ICD-10-CM | POA: Insufficient documentation

## 2019-04-01 DIAGNOSIS — Z3A36 36 weeks gestation of pregnancy: Secondary | ICD-10-CM

## 2019-04-01 DIAGNOSIS — O10013 Pre-existing essential hypertension complicating pregnancy, third trimester: Secondary | ICD-10-CM

## 2019-04-01 LAB — CERVICOVAGINAL ANCILLARY ONLY
Chlamydia: NEGATIVE
Comment: NEGATIVE
Comment: NORMAL
Neisseria Gonorrhea: NEGATIVE

## 2019-04-01 NOTE — Progress Notes (Signed)
Pt was prescribed labetalol 200 mg twice daily on 03/30/18. She did not feel comfortable taking this dose, she only took 100 mg last night and this morning. BP today 141/103.

## 2019-04-01 NOTE — Telephone Encounter (Signed)
Preadmission screen  

## 2019-04-02 LAB — STREP GP B NAA: Strep Gp B NAA: POSITIVE — AB

## 2019-04-03 ENCOUNTER — Other Ambulatory Visit: Payer: Self-pay

## 2019-04-03 ENCOUNTER — Other Ambulatory Visit (HOSPITAL_COMMUNITY): Payer: Medicaid Other

## 2019-04-03 ENCOUNTER — Other Ambulatory Visit (HOSPITAL_COMMUNITY)
Admission: RE | Admit: 2019-04-03 | Discharge: 2019-04-03 | Disposition: A | Discharge status: Home / Self Care | Payer: Managed Care, Other (non HMO) | Source: Ambulatory Visit | Attending: Family Medicine | Admitting: Family Medicine

## 2019-04-03 DIAGNOSIS — Z01812 Encounter for preprocedural laboratory examination: Secondary | ICD-10-CM | POA: Insufficient documentation

## 2019-04-03 DIAGNOSIS — Z20822 Contact with and (suspected) exposure to covid-19: Secondary | ICD-10-CM | POA: Insufficient documentation

## 2019-04-03 LAB — SARS CORONAVIRUS 2 (TAT 6-24 HRS): SARS Coronavirus 2: NEGATIVE

## 2019-04-03 NOTE — MAU Note (Signed)
Asymptomatic, swab collected. 

## 2019-04-04 DIAGNOSIS — O24420 Gestational diabetes mellitus in childbirth, diet controlled: Secondary | ICD-10-CM | POA: Diagnosis not present

## 2019-04-04 DIAGNOSIS — O99824 Streptococcus B carrier state complicating childbirth: Secondary | ICD-10-CM | POA: Diagnosis not present

## 2019-04-04 DIAGNOSIS — O1002 Pre-existing essential hypertension complicating childbirth: Secondary | ICD-10-CM | POA: Diagnosis not present

## 2019-04-04 DIAGNOSIS — Z3A37 37 weeks gestation of pregnancy: Secondary | ICD-10-CM | POA: Diagnosis not present

## 2019-04-04 NOTE — H&P (Signed)
LABOR AND DELIVERY ADMISSION HISTORY AND PHYSICAL NOTE  Gloria Kemp is a 24 y.o. female G1P0 with IUP at [redacted]w[redacted]d by 17wk [place] presenting for IOL for FGR.   She reports positive fetal movement. She denies leakage of fluid, vaginal bleeding, or contractions.   She plans on breast feeding. Her contraception plan is: condoms.  Prenatal History/Complications: PNC at [facility]:  @[redacted]w[redacted]d , CWD, normal anatomy, cephalic presentation, anterior placenta, 3%ile, EFW 1901 grams  Pregnancy complications:  - cHTN on meds - FGR, 3% - GDMA1  Past Medical History: Past Medical History:  Diagnosis Date  . Gestational diabetes   . Hypertension   . Medical history non-contributory     Past Surgical History: Past Surgical History:  Procedure Laterality Date  . ADENOIDECTOMY    . EYE SURGERY      Obstetrical History: OB History    Gravida  1   Para      Term      Preterm      AB      Living        SAB      TAB      Ectopic      Multiple      Live Births              Social History: Social History   Socioeconomic History  . Marital status: Single    Spouse name: Not on file  . Number of children: Not on file  . Years of education: Not on file  . Highest education level: Not on file  Occupational History  . Occupation: [employer]  Tobacco Use  . Smoking status: Never Smoker  . Smokeless tobacco: Never Used  Substance and Sexual Activity  . Alcohol use: Not Currently  . Drug use: Never  . Sexual activity: Yes    Birth control/protection: None  Other Topics Concern  . Not on file  Social History Narrative  . Not on file   Social Determinants of Health   Financial Resource Strain:   . Difficulty of Paying Living Expenses: Not on file  Food Insecurity:   . Worried About in the Last Year: Not on file  . Ran Out of Food in the Last Year: Not on file  Transportation Needs:   . Lack of Transportation (Medical): Not on file  . Lack of  Transportation (Non-Medical): Not on file  Physical Activity:   . Days of Exercise per Week: Not on file  . Minutes of Exercise per Session: Not on file  Stress:   . Feeling of Stress : Not on file  Social Connections:   . Frequency of Communication with Friends and Family: Not on file  . Frequency of Social Gatherings with Friends and Family: Not on file  . Attends Religious Services: Not on file  . Active Member of Clubs or Organizations: Not on file  . Attends Programme researcher, broadcasting/film/video Meetings: Not on file  . Marital Status: Not on file    Family History: Family History  Problem Relation Age of Onset  . Hypertension Mother   . Obesity Mother   . Diabetes Father   . Hearing loss Father   . Hypertension Father   . Obesity Father   . Stroke Paternal Aunt   . Kidney disease Paternal Uncle   . Hypertension Maternal Grandmother   . Obesity Maternal Grandmother   . Cancer Maternal Grandfather   . Arthritis Paternal Grandmother   . Diabetes  Paternal Grandmother   . Hypertension Paternal Grandmother   . Miscarriages / Stillbirths Paternal Grandmother   . Obesity Paternal Grandmother     Allergies: No Known Allergies  No medications prior to admission.     Review of Systems  All systems reviewed and negative except as stated in HPI  Physical Exam Last menstrual period 07/09/2018. General appearance: alert, oriented, NAD Lungs: normal respiratory effort Heart: regular rate Abdomen: soft, non-tender; gravid Extremities: No calf swelling or tenderness Presentation: cephalic by SVE  Fetal monitoringBaseline: 145 bpm, Variability: Good {> 6 bpm), Accelerations: Reactive and Decelerations: Absent Uterine activity: rare     Prenatal labs: ABO, Rh: B/Positive/-- (07/14 1339) Antibody: Negative (07/14 1339) Rubella: 3.61 (07/14 1339) RPR: Non Reactive (07/14 1339)  HBsAg: Negative (07/14 1339)  HIV: Non Reactive (07/14 1339)  GC/Chlamydia: neg/neg 03/31/2019  GBS:  --Tessie Fass (01/05 1104)  2-hr GTT: abnormal ([phone]) 10/24/2018 Genetic screening:  Insufficient fetal fraction, declined repeat Anatomy US: FGR 3%, ?VSD that resolved on follow up  Prenatal Transfer Tool  Maternal Diabetes: Yes:  Diabetes Type:  Diet controlled Genetic Screening: Declined Maternal Ultrasounds/Referrals: IUGR Fetal Ultrasounds or other Referrals:  None Maternal Substance Abuse:  No Significant Maternal Medications:  Meds include: Other: Labetalol 200mg  BID Significant Maternal Lab Results: Group B Strep positive  No results found for this or any previous visit (from the past 24 hour(s)).  Patient Active Problem List   Diagnosis Date Noted  . IUGR (intrauterine growth restriction) affecting care of mother 02/12/2019  . Abnormal fetal ultrasound 01/22/2019  . Chronic benign essential hypertension, antepartum 01/08/2019  . Gestational diabetes 10/27/2018  . Obesity in pregnancy 10/19/2018  . Alpha thalassemia silent carrier 10/16/2018  . Supervision of normal first pregnancy 09/16/2018    Assessment: Gloria Kemp is a 24 y.o. G1P0 at [redacted]w[redacted]d here for IOL for FGR 3%, cHTN on meds, GDMA1.  #Labor: FB placed 0120, start low dose pitocin #Pain: IV pain meds PRN, epidural upon request #FWB: Cat I #GBS/ID: positive, penicillin #COVID: swab pending #MOF: Breast #MOC: Condoms #Circ: Yes, outpatient  #FGR: EFW 3%. Last [place] 04/01/2019 with BPP 8/8. Last dopplers 03/24/2019 were normal.   #cHTN: on 200mg  labetalol BID, cont inpatient, baseline labs on admission.  #GDMA1: CBG q4h latent labor, q2h active  Clarnce Flock 04/04/2019, 8:35 PM

## 2019-04-05 ENCOUNTER — Other Ambulatory Visit: Payer: Self-pay

## 2019-04-05 ENCOUNTER — Inpatient Hospital Stay (HOSPITAL_COMMUNITY): Payer: Managed Care, Other (non HMO)

## 2019-04-05 ENCOUNTER — Inpatient Hospital Stay (HOSPITAL_COMMUNITY)
Admission: AD | Admit: 2019-04-05 | Discharge: 2019-04-07 | DRG: 806 | Disposition: A | Discharge status: Home / Self Care | Payer: Managed Care, Other (non HMO) | Attending: Obstetrics and Gynecology | Admitting: Obstetrics and Gynecology

## 2019-04-05 ENCOUNTER — Encounter (HOSPITAL_COMMUNITY): Payer: Self-pay | Admitting: Obstetrics and Gynecology

## 2019-04-05 DIAGNOSIS — O99824 Streptococcus B carrier state complicating childbirth: Secondary | ICD-10-CM | POA: Diagnosis present

## 2019-04-05 DIAGNOSIS — Z3A36 36 weeks gestation of pregnancy: Secondary | ICD-10-CM

## 2019-04-05 DIAGNOSIS — O1002 Pre-existing essential hypertension complicating childbirth: Secondary | ICD-10-CM

## 2019-04-05 DIAGNOSIS — D563 Thalassemia minor: Secondary | ICD-10-CM | POA: Diagnosis present

## 2019-04-05 DIAGNOSIS — O99214 Obesity complicating childbirth: Secondary | ICD-10-CM | POA: Diagnosis present

## 2019-04-05 DIAGNOSIS — O365990 Maternal care for other known or suspected poor fetal growth, unspecified trimester, not applicable or unspecified: Secondary | ICD-10-CM | POA: Diagnosis present

## 2019-04-05 DIAGNOSIS — Z01812 Encounter for preprocedural laboratory examination: Secondary | ICD-10-CM | POA: Diagnosis not present

## 2019-04-05 DIAGNOSIS — Z20822 Contact with and (suspected) exposure to covid-19: Secondary | ICD-10-CM | POA: Diagnosis present

## 2019-04-05 DIAGNOSIS — O24420 Gestational diabetes mellitus in childbirth, diet controlled: Secondary | ICD-10-CM | POA: Diagnosis present

## 2019-04-05 DIAGNOSIS — O24410 Gestational diabetes mellitus in pregnancy, diet controlled: Secondary | ICD-10-CM

## 2019-04-05 DIAGNOSIS — O24419 Gestational diabetes mellitus in pregnancy, unspecified control: Secondary | ICD-10-CM | POA: Diagnosis present

## 2019-04-05 DIAGNOSIS — O99210 Obesity complicating pregnancy, unspecified trimester: Secondary | ICD-10-CM | POA: Diagnosis present

## 2019-04-05 DIAGNOSIS — O365930 Maternal care for other known or suspected poor fetal growth, third trimester, not applicable or unspecified: Secondary | ICD-10-CM | POA: Diagnosis present

## 2019-04-05 DIAGNOSIS — O10019 Pre-existing essential hypertension complicating pregnancy, unspecified trimester: Secondary | ICD-10-CM | POA: Diagnosis present

## 2019-04-05 DIAGNOSIS — O283 Abnormal ultrasonic finding on antenatal screening of mother: Secondary | ICD-10-CM | POA: Diagnosis present

## 2019-04-05 DIAGNOSIS — E669 Obesity, unspecified: Secondary | ICD-10-CM | POA: Diagnosis present

## 2019-04-05 DIAGNOSIS — Z3A37 37 weeks gestation of pregnancy: Secondary | ICD-10-CM | POA: Diagnosis not present

## 2019-04-05 DIAGNOSIS — Z3403 Encounter for supervision of normal first pregnancy, third trimester: Secondary | ICD-10-CM

## 2019-04-05 DIAGNOSIS — Z3400 Encounter for supervision of normal first pregnancy, unspecified trimester: Secondary | ICD-10-CM

## 2019-04-05 LAB — TYPE AND SCREEN
ABO/RH(D): B POS
Antibody Screen: NEGATIVE

## 2019-04-05 LAB — COMPREHENSIVE METABOLIC PANEL
ALT: 18 U/L (ref 0–44)
AST: 18 U/L (ref 15–41)
Albumin: 3 g/dL — ABNORMAL LOW (ref 3.5–5.0)
Alkaline Phosphatase: 129 U/L — ABNORMAL HIGH (ref 38–126)
Anion gap: 11 (ref 5–15)
BUN: 14 mg/dL (ref 6–20)
CO2: 19 mmol/L — ABNORMAL LOW (ref 22–32)
Calcium: 9.5 mg/dL (ref 8.9–10.3)
Chloride: 104 mmol/L (ref 98–111)
Creatinine, Ser: 0.8 mg/dL (ref 0.44–1.00)
GFR calc Af Amer: >60 mL/min (ref >60)
GFR calc non Af Amer: >60 mL/min (ref >60)
Glucose, Bld: 109 mg/dL — ABNORMAL HIGH (ref 70–99)
Potassium: 3.9 mmol/L (ref 3.5–5.1)
Sodium: 134 mmol/L — ABNORMAL LOW (ref 135–145)
Total Bilirubin: 0.3 mg/dL (ref 0.3–1.2)
Total Protein: 6.5 g/dL (ref 6.5–8.1)

## 2019-04-05 LAB — CBC
HCT: 38.2 % (ref 36.0–46.0)
Hemoglobin: 12.7 g/dL (ref 12.0–15.0)
MCH: 27.3 pg (ref 26.0–34.0)
MCHC: 33.2 g/dL (ref 30.0–36.0)
MCV: 82.2 fL (ref 80.0–100.0)
Platelets: 162 10*3/uL (ref 150–400)
RBC: 4.65 MIL/uL (ref 3.87–5.11)
RDW: 13.8 % (ref 11.5–15.5)
WBC: 8.1 10*3/uL (ref 4.0–10.5)
nRBC: 0 % (ref 0.0–0.2)

## 2019-04-05 LAB — GLUCOSE, CAPILLARY
Glucose-Capillary: 100 mg/dL — ABNORMAL HIGH (ref 70–99)
Glucose-Capillary: 106 mg/dL — ABNORMAL HIGH (ref 70–99)
Glucose-Capillary: 82 mg/dL (ref 70–99)
Glucose-Capillary: 86 mg/dL (ref 70–99)
Glucose-Capillary: 95 mg/dL (ref 70–99)

## 2019-04-05 LAB — RPR: RPR Ser Ql: NONREACTIVE

## 2019-04-05 LAB — ABO/RH: ABO/RH(D): B POS

## 2019-04-05 LAB — PROTEIN / CREATININE RATIO, URINE
Creatinine, Urine: 167.39 mg/dL
Protein Creatinine Ratio: 0.11 mg/mg{Cre} (ref 0.00–0.15)
Total Protein, Urine: 19 mg/dL

## 2019-04-05 MED ORDER — COCONUT OIL OIL: 1.0000 "application " | TOPICAL_OIL | Status: DC | PRN

## 2019-04-05 MED ORDER — SENNOSIDES-DOCUSATE SODIUM 8.6-50 MG PO TABS
1.0000 | ORAL_TABLET | Freq: Two times a day (BID) | ORAL | Status: DC
  Filled 2019-04-05 (×4): qty 1

## 2019-04-05 MED ORDER — DIPHENHYDRAMINE HCL 25 MG PO CAPS: 25.0000 mg | ORAL_CAPSULE | Freq: Four times a day (QID) | ORAL | Status: DC | PRN

## 2019-04-05 MED ORDER — FLEET ENEMA 7-19 GM/118ML RE ENEM: 1.0000 | ENEMA | RECTAL | Status: DC | PRN

## 2019-04-05 MED ORDER — ACETAMINOPHEN 325 MG PO TABS: 650.0000 mg | ORAL_TABLET | ORAL | Status: DC | PRN

## 2019-04-05 MED ORDER — FENTANYL CITRATE (PF) 100 MCG/2ML IJ SOLN
100.0000 ug | INTRAMUSCULAR | Status: DC | PRN
  Administered 2019-04-05 (×6): 100 ug via INTRAVENOUS
  Filled 2019-04-05 (×6): qty 2

## 2019-04-05 MED ORDER — SODIUM CHLORIDE 0.9% FLUSH: 3.0000 mL | Freq: Two times a day (BID) | INTRAVENOUS | Status: DC

## 2019-04-05 MED ORDER — MEASLES, MUMPS & RUBELLA VAC IJ SOLR: 0.5000 mL | Freq: Once | INTRAMUSCULAR | Status: DC

## 2019-04-05 MED ORDER — SODIUM CHLORIDE 0.9% FLUSH: 3.0000 mL | INTRAVENOUS | Status: DC | PRN

## 2019-04-05 MED ORDER — SODIUM CHLORIDE 0.9 % IV SOLN: 250.0000 mL | INTRAVENOUS | Status: DC | PRN

## 2019-04-05 MED ORDER — PENICILLIN G POT IN DEXTROSE 60000 UNIT/ML IV SOLN
3.0000 10*6.[IU] | INTRAVENOUS | Status: DC
  Administered 2019-04-05 (×4): 3 10*6.[IU] via INTRAVENOUS
  Filled 2019-04-05 (×7): qty 50

## 2019-04-05 MED ORDER — OXYCODONE HCL 5 MG PO TABS: 10.0000 mg | ORAL_TABLET | ORAL | Status: DC | PRN

## 2019-04-05 MED ORDER — BENZOCAINE-MENTHOL 20-0.5 % EX AERO
1.0000 "application " | INHALATION_SPRAY | CUTANEOUS | Status: DC | PRN
  Filled 2019-04-05: qty 56

## 2019-04-05 MED ORDER — ZOLPIDEM TARTRATE 5 MG PO TABS: 5.0000 mg | ORAL_TABLET | Freq: Every evening | ORAL | Status: DC | PRN

## 2019-04-05 MED ORDER — LACTATED RINGERS IV SOLN
500.0000 mL | INTRAVENOUS | Status: DC | PRN
  Administered 2019-04-05: 500 mL via INTRAVENOUS

## 2019-04-05 MED ORDER — OXYCODONE-ACETAMINOPHEN 5-325 MG PO TABS: 2.0000 | ORAL_TABLET | ORAL | Status: DC | PRN

## 2019-04-05 MED ORDER — OXYTOCIN 40 UNITS IN NORMAL SALINE INFUSION - SIMPLE MED
1.0000 m[IU]/min | INTRAVENOUS | Status: DC
  Administered 2019-04-05: 2 m[IU]/min via INTRAVENOUS
  Filled 2019-04-05: qty 1000

## 2019-04-05 MED ORDER — OXYCODONE HCL 5 MG PO TABS: 5.0000 mg | ORAL_TABLET | ORAL | Status: DC | PRN

## 2019-04-05 MED ORDER — LIDOCAINE HCL (PF) 1 % IJ SOLN: 30.0000 mL | INTRAMUSCULAR | Status: DC | PRN

## 2019-04-05 MED ORDER — LABETALOL HCL 200 MG PO TABS
200.0000 mg | ORAL_TABLET | Freq: Two times a day (BID) | ORAL | Status: DC
  Administered 2019-04-05 – 2019-04-07 (×5): 200 mg via ORAL
  Filled 2019-04-05 (×5): qty 1

## 2019-04-05 MED ORDER — WITCH HAZEL-GLYCERIN EX PADS: 1.0000 "application " | MEDICATED_PAD | CUTANEOUS | Status: DC | PRN

## 2019-04-05 MED ORDER — TERBUTALINE SULFATE 1 MG/ML IJ SOLN
0.2500 mg | Freq: Once | INTRAMUSCULAR | Status: AC
  Administered 2019-04-05: 0.25 mg via SUBCUTANEOUS

## 2019-04-05 MED ORDER — SIMETHICONE 80 MG PO CHEW: 80.0000 mg | CHEWABLE_TABLET | ORAL | Status: DC | PRN

## 2019-04-05 MED ORDER — LACTATED RINGERS IV SOLN: INTRAVENOUS | Status: DC

## 2019-04-05 MED ORDER — SOD CITRATE-CITRIC ACID 500-334 MG/5ML PO SOLN
30.0000 mL | ORAL | Status: DC | PRN
  Filled 2019-04-05: qty 30

## 2019-04-05 MED ORDER — IBUPROFEN 600 MG PO TABS
600.0000 mg | ORAL_TABLET | Freq: Four times a day (QID) | ORAL | Status: DC
  Administered 2019-04-05 – 2019-04-07 (×7): 600 mg via ORAL
  Filled 2019-04-05 (×7): qty 1

## 2019-04-05 MED ORDER — OXYTOCIN 40 UNITS IN NORMAL SALINE INFUSION - SIMPLE MED: 2.5000 [IU]/h | INTRAVENOUS | Status: DC

## 2019-04-05 MED ORDER — OXYTOCIN BOLUS FROM INFUSION
500.0000 mL | Freq: Once | INTRAVENOUS | Status: AC
  Administered 2019-04-05: 500 mL via INTRAVENOUS

## 2019-04-05 MED ORDER — TETANUS-DIPHTH-ACELL PERTUSSIS 5-2.5-18.5 LF-MCG/0.5 IM SUSP: 0.5000 mL | Freq: Once | INTRAMUSCULAR | Status: DC

## 2019-04-05 MED ORDER — ONDANSETRON HCL 4 MG/2ML IJ SOLN: 4.0000 mg | INTRAMUSCULAR | Status: DC | PRN

## 2019-04-05 MED ORDER — ONDANSETRON HCL 4 MG PO TABS: 4.0000 mg | ORAL_TABLET | ORAL | Status: DC | PRN

## 2019-04-05 MED ORDER — SODIUM CHLORIDE 0.9 % IV SOLN
5.0000 10*6.[IU] | Freq: Once | INTRAVENOUS | Status: AC
  Administered 2019-04-05: 5 10*6.[IU] via INTRAVENOUS
  Filled 2019-04-05: qty 5

## 2019-04-05 MED ORDER — TERBUTALINE SULFATE 1 MG/ML IJ SOLN
0.2500 mg | Freq: Once | INTRAMUSCULAR | Status: DC | PRN
  Filled 2019-04-05: qty 1

## 2019-04-05 MED ORDER — ONDANSETRON HCL 4 MG/2ML IJ SOLN: 4.0000 mg | Freq: Four times a day (QID) | INTRAMUSCULAR | Status: DC | PRN

## 2019-04-05 MED ORDER — DIBUCAINE (PERIANAL) 1 % EX OINT: 1.0000 "application " | TOPICAL_OINTMENT | CUTANEOUS | Status: DC | PRN

## 2019-04-05 MED ORDER — TERBUTALINE SULFATE 1 MG/ML IJ SOLN
0.2500 mg | Freq: Once | INTRAMUSCULAR | Status: AC | PRN
  Administered 2019-04-05: 0.25 mg via SUBCUTANEOUS

## 2019-04-05 MED ORDER — PRENATAL MULTIVITAMIN CH
1.0000 | ORAL_TABLET | Freq: Every day | ORAL | Status: DC
  Administered 2019-04-06 – 2019-04-07 (×2): 1 via ORAL
  Filled 2019-04-05 (×2): qty 1

## 2019-04-05 MED ORDER — OXYCODONE-ACETAMINOPHEN 5-325 MG PO TABS: 1.0000 | ORAL_TABLET | ORAL | Status: DC | PRN

## 2019-04-05 NOTE — Progress Notes (Addendum)
Gloria Kemp is a 24 y.o. G1P0 at [redacted]w[redacted]d by ultrasound and LMP admitted for induction of labor due to FGR, 3%.  Subjective: Coping well.  Feels that contractions are more intense and would like something for pain.  Discussed IV pain medication and epidural.  Desires IV pain medication at this time.    Objective: BP (!) 140/93   Pulse (!) 105   Temp 99 F (37.2 C) (Oral)   Resp 18   Ht 5\' 2"  (1.575 m)   Wt 89.4 kg   LMP 07/09/2018 (Approximate)   SpO2 100%   BMI 36.05 kg/m  No intake/output data recorded. No intake/output data recorded.  FHT:  FHR: 135 bpm, variability: moderate,  accelerations:  Present,  decelerations:  Absent UC:   regular, every 2-3 minutes SVE:   Dilation: 5 Effacement (%): 70 Station: -3 Exam by:: [email or web address], Midwife Student  Labs: Lab Results  Component Value Date   WBC 8.1 04/05/2019   HGB 12.7 04/05/2019   HCT 38.2 04/05/2019   MCV 82.2 04/05/2019   PLT 162 04/05/2019    Assessment / Plan: Induction of labor due to FGR,  progressing well on pitocin   -IV Fentanyl 06/03/2019 for pain. -Epidural PRN - SVE @ 1530 to assess cervical change.  Will assess ability to AROM at this time.    Labor: Progressing normally Preeclampsia:  Denies signs and symptoms Fetal Wellbeing:  Category I Pain Control:  Epidural and IV pain meds I/D:  n/a Anticipated MOD:  NSVD  04/05/2019, 2:33 PM

## 2019-04-05 NOTE — Progress Notes (Signed)
Gloria Kemp is a 24 y.o. G1P0 at [redacted]w[redacted]d by LMP and ultrasound admitted for induction of labor due to FGR, 3%.  Subjective: Doing well, resting in bed comfortably. Denies pain, no complaints.  Objective: BP 126/90 (BP Location: Right Arm)   Pulse (!) 101   Temp 98.4 F (36.9 C) (Oral)   Resp 18   Ht 5\' 2"  (1.575 m)   Wt 89.4 kg   LMP 07/09/2018 (Approximate)   SpO2 100%   BMI 36.05 kg/m  No intake/output data recorded. No intake/output data recorded.  FHT:  FHR: 135 bpm, variability: moderate,  accelerations:  Present,  decelerations:  Absent UC:   irregular, every 10 minutes SVE:   Dilation: 4 Effacement (%): 50 Station: -3 Exam by:: [email or web address], RN   Labs: Lab Results  Component Value Date   WBC 8.1 04/05/2019   HGB 12.7 04/05/2019   HCT 38.2 04/05/2019   MCV 82.2 04/05/2019   PLT 162 04/05/2019    Assessment / Plan: 24 y.o. G1 @ 37.0 for IOL for FGR 3%  -Start pitocin at 2 milli units and increase by 2 milli units every 30 minutes to achieve contractions that are every 2-3 minutes and moderate in intensity. - Epidural PRN   Labor: Latent phase Fetal Wellbeing:  Category I Pain Control:  Epidural and IV pain meds I/D:  n/a Anticipated MOD:  NSVD  21, RNC 04/05/2019, 9:14 AM

## 2019-04-05 NOTE — Progress Notes (Signed)
Gloria Kemp is a 24 y.o. G1P0 at [redacted]w[redacted]d by LMP and ultrasound admitted for induction of labor due to FGR, 3%.  Subjective: Doing well, feeling some cramping.  Does not feel needs anything for pain control.  No complaints.  Objective: BP (!) 143/95   Pulse 98   Temp 97.6 F (36.4 C) (Axillary)   Resp 18   Ht 5\' 2"  (1.575 m)   Wt 89.4 kg   LMP 07/09/2018 (Approximate)   SpO2 100%   BMI 36.05 kg/m  No intake/output data recorded. No intake/output data recorded.  FHT:  FHR: 145 bpm, variability: moderate,  accelerations:  Present,  decelerations:  Absent UC:   regular, every 2-4 minutes SVE:   Dilation: 5 Effacement (%): 70 Station: -3 Exam by:: [email or web address], Midwife Student  Labs: Lab Results  Component Value Date   WBC 8.1 04/05/2019   HGB 12.7 04/05/2019   HCT 38.2 04/05/2019   MCV 82.2 04/05/2019   PLT 162 04/05/2019    Assessment / Plan: Induction of labor due to IUGR,  progressing well on pitocin   -Continue to increase by 2 milli units every 30 minutes to achieve contractions that are every 2-3 minutes and moderate in intensity. - Epidural PRN - SVE in 2-3 hours to assess need for AROM.  Labor: Progressing on Pitocin, will continue to increase then AROM Preeclampsia:  no signs or symptoms Fetal Wellbeing:  Category I Pain Control:  Epidural and IV pain meds I/D:  n/a Anticipated MOD:  NSVD  06/03/2019, SNM 04/05/2019, 12:31 PM

## 2019-04-05 NOTE — Progress Notes (Signed)
Called to bedside for prolonged decelerations.   Appear to be associated with contractions with dips down into 40's. Initial turned off pitocin after second episode, but at third episode also gave dose of terbutaline.   Normal baseline with moderate variability between contractions. Discussed w Dr. Vergie Living, will continue induction with foley bulb alone at present.

## 2019-04-05 NOTE — Discharge Summary (Addendum)
Postpartum Discharge Summary  Date of Service updated     Patient Name: Gloria Kemp DOB: 04-16-1995 MRN: [id number]  Date of admission: 04/05/2019 Delivering Provider: Zettie Cooley E   Date of discharge: 04/07/2019  Admitting diagnosis: Labor and delivery, indication for care [O75.9] Intrauterine pregnancy: [redacted]w[redacted]d    Secondary diagnosis:  Active Problems:   Supervision of normal first pregnancy   Alpha thalassemia silent carrier   Obesity in pregnancy   Gestational diabetes   Chronic benign essential hypertension, antepartum   Abnormal fetal ultrasound   IUGR (intrauterine growth restriction) affecting care of mother   Labor and delivery, indication for care  Additional problems: None.      Discharge diagnosis: Term Pregnancy Delivered, CHTN and GDM A1                                                                                                Post partum procedures: None.   Augmentation: AROM, Pitocin and Foley Balloon  Complications: None  Hospital course:  Induction of Labor With Vaginal Delivery   24y.o. yo G1P0 at 367w0das admitted to the hospital 04/05/2019 for induction of labor.  Indication for induction: A1 DM and cHTN and FGR 3% .  Patient had an uncomplicated labor course as follows: induced with foley bulb, pitocin, and AROM and progressed to fully dilated.  Membrane Rupture Time/Date: 4:15 PM ,04/05/2019   Intrapartum Procedures: Episiotomy: None [1]                                         Lacerations:  None [1]  Patient had delivery of a Viable infant.  Information for the patient's newborn:  BaAngeliah, [id number] Method: Vaginal, Spontaneous(Filed from Delivery Summary)    04/05/2019  Details of delivery can be found in separate delivery note.  Patient had a routine postpartum course. Patient is discharged home 04/07/19. Delivery time: 8:31 PM    Magnesium Sulfate received: No BMZ received:  No Rhophylac:N/A MMR:N/A Transfusion:No  Physical exam  Vitals:   04/06/19 0930 04/06/19 1413 04/06/19 2226 04/07/19 0537  BP: 123/69 120/72 (!) 137/91 (!) 109/57  Pulse: 97 94 86 87  Resp: 20 20  18   Temp: 98.3 F (36.8 C) 97.8 F (36.6 C)  98.4 F (36.9 C)  TempSrc: Oral Oral  Oral  SpO2: 100% 100%    Weight:      Height:       General: alert, cooperative and no distress Lochia: appropriate Uterine Fundus: firm Incision: N/a DVT Evaluation: No evidence of DVT seen on physical exam. Labs: Lab Results  Component Value Date   WBC 8.1 04/05/2019   HGB 12.7 04/05/2019   HCT 38.2 04/05/2019   MCV 82.2 04/05/2019   PLT 162 04/05/2019   CMP Latest Ref Rng & Units 04/05/2019  Glucose 70 - 99 mg/dL 109(H)  BUN 6 - 20 mg/dL 14  Creatinine 0.44 - 1.00 mg/dL 0.80  Sodium 135 - 145 mmol/L 134(L)  Potassium 3.5 -  5.1 mmol/L 3.9  Chloride 98 - 111 mmol/L 104  CO2 22 - 32 mmol/L 19(L)  Calcium 8.9 - 10.3 mg/dL 9.5  Total Protein 6.5 - 8.1 g/dL 6.5  Total Bilirubin 0.3 - 1.2 mg/dL 0.3  Alkaline Phos 38 - 126 U/L 129(H)  AST 15 - 41 U/L 18  ALT 0 - 44 U/L 18    Discharge instruction: per After Visit Summary and "Baby and Me Booklet".  After visit meds:  Allergies as of 04/07/2019   No Known Allergies      Medication List     TAKE these medications    Accu-Chek FastClix Lancets Misc 1 Units by Percutaneous route 4 (four) times daily.   Accu-Chek Guide test strip Generic drug: glucose blood Use to check blood sugars four times a day was instructed   Accu-Chek Guide w/Device Kit 1 Device by Does not apply route 4 (four) times daily.   acetaminophen 325 MG tablet Commonly known as: Tylenol Take 2 tablets (650 mg total) by mouth every 4 (four) hours as needed (for pain scale < 4).   aspirin 81 MG chewable tablet Chew 1 tablet (81 mg total) by mouth daily.   Blood Pressure Monitoring Kit 1 kit by Does not apply route once a week.   Blood Pressure Monitor  Kit 1 Device by Does not apply route once a week. To be monitored Regularly at home.   [place] Supp Sm Misc Wear as directed.   ibuprofen 600 MG tablet Commonly known as: ADVIL Take 1 tablet (600 mg total) by mouth every 6 (six) hours.   labetalol 100 MG tablet Commonly known as: NORMODYNE Take 2 tablets (200 mg total) by mouth 2 (two) times daily.   Misc. Devices Misc Dispense one maternity belt for patient   multivitamin-prenatal 27-0.8 MG Tabs tablet Take 1 tablet by mouth daily at 12 noon.        Diet: carb modified diet  Activity: Advance as tolerated. Pelvic rest for 6 weeks.   Outpatient follow up:6 weeks Follow up Appt: Future Appointments  Date Time Provider [place]  04/14/2019 10:30 AM [place] None  05/07/2019 10:30 AM Constant, Peggy, MD [facility] None  05/18/2019  8:30 AM [facility] None   Follow up Visit:   Please schedule this patient for Postpartum visit in: 6 weeks with the following provider: Any provider For C/S patients schedule nurse incision check in weeks 2 weeks: no High risk pregnancy complicated by: FGR 3%, cHTN on meds, GMDA1 Delivery mode:  SVD Anticipated Birth Control:  Condoms PP Procedures needed: BP check, 2hr GTT  Schedule Integrated BH visit: no   Newborn Data: Live born female  Birth Weight:  2129 grams APGAR: 8, 9  Newborn Delivery   Birth date/time: 04/05/2019 20:31:00 Delivery type: Vaginal, Spontaneous      Baby Feeding: Breast Disposition:home with mother   04/07/2019 Ellin Goodie, Student-PA  I confirm that I have verified the information documented in the physician assistant student's note and that I have also personally reperformed the history, physical exam and all medical decision making activities of this service and have verified that all service and findings are accurately documented in this student's note.   Patient has BP check for 1/19. Advised to continue  labetalol as prescribed and will assess at next visit.    Marcille Buffy DNP, CNM  04/07/19  11:32 AM

## 2019-04-05 NOTE — Progress Notes (Signed)
CNM at bedside due to deep variables.  Patient in high fowlers.  Turned to right lateral, then to left lateral. Continued to have variable decelerations.  Pitocin halfed to 6 milli units. Pt changed to hands and knees position, which was maintained for approximately 4 minutes, during which time, Pitocin was stopped.  Dr. Alysia Penna called to bedside.  Patient changed to left lateral position with trendelenburg and terbutaline was given.  FHR stabilized and decelerations became less deep.  Dr. Alysia Penna at bedside assessing patient.  Decelerations resolved, patient remains in left lateral position with T-berg.    Will continue to monitor FHR as labor progresses.   Sherlyn Lees, RNC, SNM

## 2019-04-05 NOTE — Progress Notes (Signed)
1615: SVE 6/80/-1, AROM, small amount, clear fluid.   Sherlyn Lees RNC, SNM

## 2019-04-05 NOTE — Progress Notes (Signed)
LABOR PROGRESS NOTE  Gloria Kemp is a 24 y.o. G1P0 at [redacted]w[redacted]d  admitted for IOL for FGR 3%, cHTN on meds, GDMA1.  Subjective: Feeling contractions more strongly  FB still in place  Objective: BP (!) 145/100   Pulse 91   Temp 98.7 F (37.1 C) (Oral)   Resp 18   Ht 5\' 2"  (1.575 m)   Wt 89.4 kg   LMP 07/09/2018 (Approximate)   BMI 36.05 kg/m  or  Vitals:   04/05/19 0431 04/05/19 0501 04/05/19 0531 04/05/19 0601  BP: (!) 150/93 (!) 138/91 137/90 (!) 145/100  Pulse: 79 82 93 91  Resp: 18 18 18 18   Temp:      TempSrc:      Weight:      Height:         Dilation: 1 Effacement (%): 40 Station: -3 Presentation: Vertex Exam by:: Dr. FHT: baseline rate 125, moderate varibility, +acel, -decel Toco: irregular  Labs: Lab Results  Component Value Date   WBC 8.1 04/05/2019   HGB 12.7 04/05/2019   HCT 38.2 04/05/2019   MCV 82.2 04/05/2019   PLT 162 04/05/2019    Patient Active Problem List   Diagnosis Date Noted  . Labor and delivery, indication for care 04/05/2019  . IUGR (intrauterine growth restriction) affecting care of mother 02/12/2019  . Abnormal fetal ultrasound 01/22/2019  . Chronic benign essential hypertension, antepartum 01/08/2019  . Gestational diabetes 10/27/2018  . Obesity in pregnancy 10/19/2018  . Alpha thalassemia silent carrier 10/16/2018  . Supervision of normal first pregnancy 09/16/2018    Assessment / Plan: 24 y.o. G1P0 at [redacted]w[redacted]d here for IOL for FGR 3%, cHTN on meds, GDMA1..  Labor: s/p FB/low dose pitocin at 0130, FB still in place.  Fetal Wellbeing:  Cat I Pain Control:  IV pain meds, epidural upon request GBS: GBS on penicillin Anticipated MOD:  SVD  cHTN: on 200mg  labetalol BID, cont inpatient, baseline labs on admission unremarkable.  GDMA1: CBG q4h latent labor, q2h active. Sugars have been: 100,   21, MD/MPH OB Fellow  04/05/2019, 6:06 AM

## 2019-04-06 ENCOUNTER — Encounter (HOSPITAL_COMMUNITY): Payer: Self-pay | Admitting: Obstetrics and Gynecology

## 2019-04-06 MED ORDER — ACETAMINOPHEN 325 MG PO TABS: 650.0000 mg | ORAL_TABLET | ORAL | 0 refills | Status: DC | PRN

## 2019-04-06 MED ORDER — IBUPROFEN 600 MG PO TABS: 600.0000 mg | ORAL_TABLET | Freq: Four times a day (QID) | ORAL | 0 refills | Status: DC

## 2019-04-06 MED ORDER — ACETAMINOPHEN 325 MG PO TABS: 650.0000 mg | ORAL_TABLET | ORAL | Status: DC | PRN

## 2019-04-06 MED FILL — ACETAMINOPHEN 325 MG TABS: 325 | 3 days supply | Qty: 30 | Fill #0

## 2019-04-06 MED FILL — IBUPROFEN 600 MG TABLET: 600 | 7 days supply | Qty: 30 | Fill #0

## 2019-04-06 NOTE — Progress Notes (Addendum)
Post Partum Day 1 Subjective: no complaints, up ad lib, voiding and tolerating PO  Objective: Blood pressure 127/74, pulse 85, temperature 98.3 F (36.8 C), temperature source Oral, resp. rate 18, height 5\' 2"  (1.575 m), weight 89.4 kg, last menstrual period 07/09/2018, SpO2 100 %, unknown if currently breastfeeding.  Physical Exam:  General: alert, cooperative, appears stated age, no distress and walking around room and maneuvering in bed without issues Lochia: appropriate Uterine Fundus: firm DVT Evaluation: No evidence of DVT seen on physical exam.  Recent Labs    04/05/19 0030  HGB 12.7  HCT 38.2    Assessment/Plan: Plan for DC tomorrow  CHTN: BP ranges (116-155)/(64-99). Initially elevated to 150's when arrive to MB unit and improved with labetalol dose at 2200. This Am, 127/74. Continue BID 200mg  labetolol.   GDM: CBGs well controlled during L/D. Follow up outpatient    LOS: 1 day   06/03/19 04/06/2019, 7:48 AM

## 2019-04-06 NOTE — Lactation Note (Signed)
This note was copied from a baby's chart. Lactation Consultation Note  Patient Name: Gloria Kemp [id number] Date: 04/06/2019 Reason for consult: Follow-up assessment  1640 - 1652 - I followed up with [name] to check on her progress with breast feeding. She states that her son, Gloria Kemp, has been latching to the breast and doing some lick and learn. She had her DEBP set up next to the bed, and she indicated that she has not been using it regularly because she feels that she isn't getting any milk.  We joked that she may feel like she's "pumping air" these first two days, and I reinforced the purpose of pumping and praised her for continuing on with it. I reviewed the LPI protocol to give [name] a hand out that might help her adjust the supplementation amounts each day and consider the importances of STS and time at the breast. [name] is currently supplementing her 20 hour old about 10 mls of Sim 22 Neosure.   I encouraged her to try to offer the breast first, to limit feedings to around 20 minutes at the breast and to supplement after using the guidelines provided. We discussed using her milk as it begins to increase in volume.  We also spent some time discussing how feeding the baby may look different for each person and how one might change their feeding mode. We discussed breast feeding and pumping as options, and I reviewed the benefits of her breast milk to baby.  I also discussed maternal diet and breast feeding and breast feeding support resources post discharge. I encouraged [name] to attend the support groups and to schedule a follow up lactation OP appointment.  AT this time, [name] declined breast feeding assistance. I encouraged her to call me this evening for follow up. She verbalized understanding.   Maternal Data Formula Feeding for Exclusion: Yes Does the patient have breastfeeding experience prior to this delivery?: No  Feeding Feeding Type: Bottle Fed -  Formula Nipple Type: Slow - flow   Interventions Interventions: Breast feeding basics reviewed  Lactation Tools Discussed/Used WIC Program: Yes Pump Review: Setup, frequency, and cleaning   Consult Status Consult Status: Follow-up Date: 04/07/19 Follow-up type: In-patient    Walker Shadow 04/06/2019, 5:08 PM

## 2019-04-06 NOTE — Lactation Note (Signed)
This note was copied from a baby's chart. Lactation Consultation Note Baby 4 hrs old. Sleeping soundly.  LPI information sheet given and reviewed.  Discussed supplementing after BF, not feeding longer than 30 min Mom states understanding. Mom is giving baby Similac 22 cal. Mom stated understands importance of supplementing. Discussed BF. Mom stated the baby had a strong suck. Asked mom if LC could assess breast tissue. Mom agreed. Asked mom if she knew how to hand express mom stated No. LC offered to demonstrated, mom agreed. LC hand expressed breast, no colostrum noted. Rt. Breast larger than Lt. Breast. Mom has everted nipples. Breast tissue is soft. Newborn behavior for LPI discussed as well as STS, I&O, milk storage, breast massage, pumping, supply and demand.  Mom shown how to use DEBP & how to disassemble, clean, & reassemble parts. Mom knows to pump q3h for 15-20 min.  Mom encouraged to feed baby 8-12 times/24 hours and with feeding cues.  Asked mom if she would like to pump now, mom stated OK. Mom only pumped about 5 minutes. Mom stated she was tired. MGM is at bedside. Mom appears to be pre-occupied.encouraged mom to call for assistance or questions. Lactation brochure given.  Patient Name: Gloria Kemp IWLNL'G Date: 04/06/2019 Reason for consult: Initial assessment;Early term 37-38.6wks;Infant < 6lbs;Primapara   Maternal Data Has patient been taught Hand Expression?: Yes Does the patient have breastfeeding experience prior to this delivery?: No  Feeding Feeding Type: (instructed to call for latch assist at next feed) Nipple Type: Slow - flow  LATCH Score Latch: Repeated attempts needed to sustain latch, nipple held in mouth throughout feeding, stimulation needed to elicit sucking reflex.  Audible Swallowing: A few with stimulation  Type of Nipple: Everted at rest and after stimulation  Comfort (Breast/Nipple): Soft / non-tender  Hold (Positioning): Full assist,  staff holds infant at breast  LATCH Score: 6  Interventions Interventions: Breast feeding basics reviewed;Breast compression;Hand express;DEBP  Lactation Tools Discussed/Used WIC Program: Yes Pump Review: Setup, frequency, and cleaning;Milk Storage Initiated by:: Peri Jefferson RN IBCLC Date initiated:: 04/06/19   Consult Status Consult Status: Follow-up Date: 04/06/19 Follow-up type: In-patient    Sadie Hazelett, Diamond Nickel 04/06/2019, 1:15 AM

## 2019-04-07 ENCOUNTER — Encounter: Payer: Medicaid Other | Admitting: Obstetrics and Gynecology

## 2019-04-07 NOTE — Lactation Note (Addendum)
This note was copied from a baby's chart. Lactation Consultation Note  Patient Name: Gloria Kemp URKYH'C Date: 04/07/2019 Reason for consult: Follow-up assessment  P1 mother whose infant is now 26 hours old.  This is an ETI at 37+0 weeks weighing < 5 lbs.  Mother's feeding preference is breast, however, she has had to supplement with formula due to baby's age and size.    Mother has a discharge order in and will be leaving around 1400 today.  She has been primarily bottle feeding but has allowed baby to be at the breast but stated that he has only been licking and not feeding well.  Baby has not been supplementing with adequate volumes.  I reviewed the LPTI volume guidelines and reminded mother to feed him at least 10-20 mls after every feeding.  When he turns 48 hours (approximately 2100 tonight) he should be consuming between 20-30 mls with every feeding.  Explained that he is allowed to have more if desired.  Mother has only pumped twice.  Re-educated her on the importance of consistent pumping to help establish a good milk supply.  She informed me that she "does not have anything."  Discussed milk supply and milk coming to volume.  Informed her that if she does not latch or pump at least every three hours her milk supply will not be well established.  Stressed the continued importance of pumping until she has a good supply.  Mother verbalized understanding.  Assisted mother to pump with the DEBP; reviewed set up and clean up.  #24 flange is appropriate at this time.  Reminded mother to take all pump parts home with her today to use with her DEBP from [facility].  Mother does not have a DEBP for home use.  She is a [facility] participant in [employer].  She has a [facility] appointment for next week, however, I will fax a request to get her a DEBP as soon as possible particularly since baby is only 37 weeks and < 5 pounds.  Mother will follow up.    Her mother was in the room, however, she was talking on  the phone the entire visit.  Mother has our OP phone number for questions after discharge and knows how to call if needed for assistance.     Maternal Data    Feeding    LATCH Score                   Interventions    Lactation Tools Discussed/Used     Consult Status Consult Status: Complete Date: 04/08/19 Follow-up type: In-patient    Gloria Kemp [name] 04/07/2019, 1:32 PM

## 2019-04-07 NOTE — Progress Notes (Signed)
Pt called out for assistance d/t "passing a clot." This RN went to assess. A golf ball size clot was noted in the toilet. Pt's uterus firm and U/2, and no additional clots nor bleeding expelled during fundal exam. This RN informed pt's assigned nurse G. Denny Levy, RN of findings.

## 2019-04-07 NOTE — Progress Notes (Signed)
AVS printed and discharge instructons given to patient.patient  Is aware of  follow up appointment and to pick up prescriptions. Pt verbalized understanding and has no further questions.

## 2019-04-08 ENCOUNTER — Ambulatory Visit (HOSPITAL_COMMUNITY): Payer: Medicaid Other

## 2019-04-14 ENCOUNTER — Ambulatory Visit: Payer: Managed Care, Other (non HMO)

## 2019-04-14 DIAGNOSIS — Z0130 Encounter for examination of blood pressure without abnormal findings: Secondary | ICD-10-CM

## 2019-04-23 ENCOUNTER — Other Ambulatory Visit: Payer: Self-pay

## 2019-04-23 ENCOUNTER — Ambulatory Visit: Payer: Medicaid Other

## 2019-04-23 VITALS — BP 174/113 | HR 77

## 2019-04-23 DIAGNOSIS — Z0130 Encounter for examination of blood pressure without abnormal findings: Secondary | ICD-10-CM

## 2019-04-23 NOTE — Progress Notes (Signed)
I have reviewed this chart and agree with the RN/CMA assessment and management.    K. Meryl Julien Oscar, M.D. Attending [facility] ([facility] Practice)   

## 2019-04-23 NOTE — Progress Notes (Signed)
Patient is in the office for pp BP check. Pt is taking labetalol 200 BID, but reports she did not take it today because she has to pick up refill. BP today 1172/115 and 174/113, pt denies any abnormal symptoms.  Per provider, advised pt to take medication immediately and return in 1 week for BP check. Advised pt to report to hospital if she develops symptoms.

## 2019-04-30 ENCOUNTER — Other Ambulatory Visit: Payer: Self-pay

## 2019-04-30 ENCOUNTER — Ambulatory Visit (INDEPENDENT_AMBULATORY_CARE_PROVIDER_SITE_OTHER): Payer: Medicaid Other

## 2019-04-30 VITALS — BP 161/108

## 2019-04-30 DIAGNOSIS — I10 Essential (primary) hypertension: Secondary | ICD-10-CM

## 2019-04-30 MED ORDER — LABETALOL HCL 200 MG PO TABS: 400.0000 mg | ORAL_TABLET | Freq: Two times a day (BID) | ORAL | 0 refills | Status: DC

## 2019-04-30 NOTE — Progress Notes (Signed)
Subjective:  Gloria Kemp is a 24 y.o. female here for BP check.   Hypertension ROS: taking medications as instructed, no medication side effects noted, no TIA's, no chest pain on exertion, no dyspnea on exertion and no swelling of ankles.    Objective:  BP (!) 161/121   LMP 07/09/2018 (Approximate)   Appearance alert, well appearing, and in no distress. General exam BP noted to be well controlled today in office.    Assessment:   Blood Pressure needs improvement.   Plan:  Consulted with MD  Increase Labetalol to 400 mg BID. Keep upcoming appt 05/07/19.

## 2019-05-01 NOTE — Progress Notes (Signed)
Patient seen and assessed by nursing staff during this encounter. I have reviewed the chart and agree with the documentation and plan.  Alistair Senft, MD 05/01/2019 1:11 PM    

## 2019-05-07 ENCOUNTER — Telehealth (INDEPENDENT_AMBULATORY_CARE_PROVIDER_SITE_OTHER): Payer: Medicaid Other | Admitting: Obstetrics and Gynecology

## 2019-05-07 ENCOUNTER — Encounter: Payer: Self-pay | Admitting: Obstetrics and Gynecology

## 2019-05-07 DIAGNOSIS — Z308 Encounter for other contraceptive management: Secondary | ICD-10-CM

## 2019-05-07 MED ORDER — ENALAPRIL MALEATE 5 MG PO TABS: 5.0000 mg | ORAL_TABLET | Freq: Every day | ORAL | 3 refills | Status: DC

## 2019-05-07 NOTE — Progress Notes (Signed)
TELEHEALTH POSTPARTUM VIRTUAL VIDEO VISIT ENCOUNTER NOTE   Provider location: [facility] [employer] [facility] [place]   I connected with Jacqlyn Larsen on 05/07/19 at 10:30 AM EST by MyChart Video Encounter at home and verified that I am speaking with the correct person using two identifiers.    I discussed the limitations, risks, security and privacy concerns of performing an evaluation and management service virtually and the availability of in person appointments. I also discussed with the patient that there may be a patient responsible charge related to this service. The patient expressed understanding and agreed to proceed.  Post Partum Exam  Gloria Kemp is a 24 y.o. [id number] female who presents for a postpartum visit. She is 4 weeks postpartum following a spontaneous vaginal delivery. I have fully reviewed the prenatal and intrapartum course. The delivery was at 63 gestational weeks.  Anesthesia: IV sedation. Postpartum course has been Unremarkable. Baby's course has been unremarkable. Baby is feeding by BOTH similac. Bleeding red.(Heavy Bleeding)  Bowel function is normal. Bladder function is normal. Patient is not sexually active. Contraception method is none. Postpartum depression screening:neg  Last pap smear done 10/07/18 and was Normal   Review of Systems:  Her 12 point review of systems is negative or as noted in the History of Present Illness.  Patient Active Problem List   Diagnosis Date Noted  . Labor and delivery, indication for care 04/05/2019  . IUGR (intrauterine growth restriction) affecting care of mother 02/12/2019  . Abnormal fetal ultrasound 01/22/2019  . Chronic benign essential hypertension, antepartum 01/08/2019  . Gestational diabetes 10/27/2018  . Obesity in pregnancy 10/19/2018  . Alpha thalassemia silent carrier 10/16/2018  . Supervision of normal first pregnancy 09/16/2018    Medications Mitsy Kennebrew had no medications administered during this  visit. Current Outpatient Medications  Medication Sig Dispense Refill  . labetalol (NORMODYNE) 200 MG tablet Take 2 tablets (400 mg total) by mouth 2 (two) times daily. 60 tablet 0  . Prenatal Vit-Fe Fumarate-FA (MULTIVITAMIN-PRENATAL) 27-0.8 MG TABS tablet Take 1 tablet by mouth daily at 12 noon.    . Accu-Chek FastClix Lancets MISC 1 Units by Percutaneous route 4 (four) times daily. (Patient not taking: Reported on 04/23/2019) 100 each 12  . acetaminophen (TYLENOL) 325 MG tablet Take 2 tablets (650 mg total) by mouth every 4 (four) hours as needed (for pain scale < 4). (Patient not taking: Reported on 04/23/2019) 30 tablet 0  . aspirin 81 MG chewable tablet Chew 1 tablet (81 mg total) by mouth daily. (Patient not taking: Reported on 04/23/2019) 90 tablet 3  . Blood Glucose Monitoring Suppl (ACCU-CHEK GUIDE) w/Device KIT 1 Device by Does not apply route 4 (four) times daily. (Patient not taking: Reported on 04/23/2019) 1 kit 0  . Blood Pressure Monitor KIT 1 Device by Does not apply route once a week. To be monitored Regularly at home. (Patient not taking: Reported on 04/23/2019) 1 kit 0  . Blood Pressure Monitoring KIT 1 kit by Does not apply route once a week. (Patient not taking: Reported on 04/23/2019) 1 kit 0  . Elastic Bandages & Supports (COMFORT FIT MATERNITY SUPP SM) MISC Wear as directed. (Patient not taking: Reported on 01/08/2019) 1 each 0  . glucose blood (ACCU-CHEK GUIDE) test strip Use to check blood sugars four times a day was instructed (Patient not taking: Reported on 04/23/2019) 50 each 12  . ibuprofen (ADVIL) 600 MG tablet Take 1 tablet (600 mg total) by mouth every 6 (six)  hours. (Patient not taking: Reported on 04/23/2019) 30 tablet 0  . Misc. Devices MISC Dispense one maternity belt for patient (Patient not taking: Reported on 04/23/2019) 1 each 0   No current facility-administered medications for this visit.    Allergies Patient has no known allergies.  Physical Exam:  LMP  07/09/2018 (Approximate)   General:  Alert, oriented and cooperative. Patient is in no acute distress.  Mental Status: Normal mood and affect. Normal behavior. Normal judgment and thought content.   Respiratory: Normal respiratory effort noted, no problems with respiration noted  Rest of physical exam deferred due to type of encounter  PP Depression Screening:   Edinburgh Postnatal Depression Scale Screening Tool 05/07/2019 04/06/2019 04/05/2019  I have been able to laugh and see the funny side of things. 0 0 (No Data)  I have looked forward with enjoyment to things. 0 0 -  I have blamed myself unnecessarily when things went wrong. 0 0 -  I have been anxious or worried for no good reason. 0 0 -  I have felt scared or panicky for no good reason. 0 0 -  Things have been getting on top of me. 0 0 -  I have been so unhappy that I have had difficulty sleeping. 0 0 -  I have felt sad or miserable. 0 0 -  I have been so unhappy that I have been crying. 0 0 -  The thought of harming myself has occurred to me. 0 0 -  Edinburgh Postnatal Depression Scale Total 0 0 -         Assessment:    Normal postpartum exam. Pap smear not done at today's visit.   Plan:   1. Contraception: condoms 2. Patient is not able to check her BP. Rx labetalol substituted for enalapril. Patient to have BP check at her pp glucola visit 3. Patient is medically cleared to resume all activities of daily living 4. Follow up in: 2 weeks or as needed.    I discussed the assessment and treatment plan with the patient. The patient was provided an opportunity to ask questions and all were answered. The patient agreed with the plan and demonstrated an understanding of the instructions.   The patient was advised to call back or seek an in-person evaluation/go to the ED for any concerning postpartum symptoms.  I provided 15 minutes of face-to-face time during this encounter.   Mora Bellman, MD [facility]

## 2019-05-17 ENCOUNTER — Other Ambulatory Visit: Payer: Self-pay | Admitting: Obstetrics & Gynecology

## 2019-05-18 ENCOUNTER — Other Ambulatory Visit: Payer: Medicaid Other

## 2019-09-12 ENCOUNTER — Ambulatory Visit
Admission: EM | Admit: 2019-09-12 | Discharge: 2019-09-12 | Disposition: A | Discharge status: Home / Self Care | Payer: Medicaid Other | Attending: Emergency Medicine | Admitting: Emergency Medicine

## 2019-09-12 ENCOUNTER — Other Ambulatory Visit: Payer: Self-pay

## 2019-09-12 ENCOUNTER — Encounter: Payer: Self-pay | Admitting: Emergency Medicine

## 2019-09-12 DIAGNOSIS — H60312 Diffuse otitis externa, left ear: Secondary | ICD-10-CM | POA: Diagnosis not present

## 2019-09-12 MED ORDER — CIPROFLOXACIN-DEXAMETHASONE 0.3-0.1 % OT SUSP: 4.0000 [drp] | Freq: Two times a day (BID) | OTIC | 0 refills | Status: DC

## 2019-09-12 NOTE — ED Triage Notes (Signed)
Left ear pain for a couple days. No fevers.

## 2019-09-12 NOTE — Discharge Instructions (Signed)
Use eardrops as prescribed for the next week. Return for worsening ear pain, swelling, discharge, bleeding, decreased hearing, development of jaw pain/swelling, fever.  Do NOT use Q-tips as these can cause your ear wax to get stuck, the tips may break off and become a foreign body requiring additional medical care, or puncture your eardrum.  Helpful prevention tip: Use a solution of equal parts isopropyl (rubbing) alcohol and white vinegar (acetic acid) in both ears after swimming. 

## 2019-09-12 NOTE — ED Provider Notes (Signed)
[name]    CSN: [id number] Arrival date & time: 09/12/19  0849      History   Chief Complaint Chief Complaint  Patient presents with  . Otalgia    left ear    HPI Gloria Kemp is a 24 y.o. female with history of hypertension and gestational diabetes presenting for left ear pain x3 days.  Denies change in hearing, tinnitus, vertigo, trauma.  Has not been wearing headphones in hopes this would help.   Past Medical History:  Diagnosis Date  . Gestational diabetes   . Hypertension     Patient Active Problem List   Diagnosis Date Noted  . Labor and delivery, indication for care 04/05/2019  . IUGR (intrauterine growth restriction) affecting care of mother 02/12/2019  . Abnormal fetal ultrasound 01/22/2019  . Chronic benign essential hypertension, antepartum 01/08/2019  . Gestational diabetes 10/27/2018  . Obesity in pregnancy 10/19/2018  . Alpha thalassemia silent carrier 10/16/2018  . Supervision of normal first pregnancy 09/16/2018    Past Surgical History:  Procedure Laterality Date  . ADENOIDECTOMY    . EYE SURGERY      OB History    Gravida  1   Para  1   Term  1   Preterm      AB      Living  1     SAB      TAB      Ectopic      Multiple  0   Live Births  1            Home Medications    Prior to Admission medications   Medication Sig Start Date End Date Taking? Authorizing Provider  acetaminophen (TYLENOL) 325 MG tablet Take 2 tablets (650 mg total) by mouth every 4 (four) hours as needed (for pain scale < 4). Patient not taking: Reported on 04/23/2019 04/06/19   Chancy Milroy, MD  Blood Pressure Monitor KIT 1 Device by Does not apply route once a week. To be monitored Regularly at home. Patient not taking: Reported on 04/23/2019 12/02/18   Shelly Bombard, MD  Blood Pressure Monitoring KIT 1 kit by Does not apply route once a week. Patient not taking: Reported on 04/23/2019 10/07/18   Emily Filbert, MD    ciprofloxacin-dexamethasone (CIPRODEX) OTIC suspension Place 4 drops into both ears 2 (two) times daily. 09/12/19   Hall-Potvin, [place], PA-C  enalapril (VASOTEC) 5 MG tablet Take 1 tablet (5 mg total) by mouth daily. 05/07/19   Constant, Peggy, MD  labetalol (NORMODYNE) 200 MG tablet TAKE 2 TABLETS (400 MG TOTAL) BY MOUTH 2 (TWO) TIMES DAILY. 05/18/19   Woodroe Mode, MD    Family History Family History  Problem Relation Age of Onset  . Hypertension Mother   . Obesity Mother   . Diabetes Father   . Hearing loss Father   . Hypertension Father   . Obesity Father   . Stroke Paternal Aunt   . Kidney disease Paternal Uncle   . Hypertension Maternal Grandmother   . Obesity Maternal Grandmother   . Cancer Maternal Grandfather   . Arthritis Paternal Grandmother   . Diabetes Paternal Grandmother   . Hypertension Paternal Grandmother   . Miscarriages / Stillbirths Paternal Grandmother   . Obesity Paternal Grandmother     Social History Social History   Tobacco Use  . Smoking status: Never Smoker  . Smokeless tobacco: Never Used  Vaping Use  . Vaping Use: Never  used  Substance Use Topics  . Alcohol use: Not Currently  . Drug use: Never     Allergies   Patient has no known allergies.   Review of Systems As per HPI   Physical Exam Triage Vital Signs ED Triage Vitals  Enc Vitals Group     BP      Pulse      Resp      Temp      Temp src      SpO2      Weight      Height      Head Circumference      Peak Flow      Pain Score      Pain Loc      Pain Edu?      Excl. in GC?    No data found.  Updated Vital Signs BP (!) 133/96 (BP Location: Left Arm)   Pulse 99   Temp 98.7 F (37.1 C) (Oral)   Resp 16   Wt 195 lb (88.5 kg)   LMP 09/10/2019   SpO2 97%   BMI 35.67 kg/m   Visual Acuity Right Eye Distance:   Left Eye Distance:   Bilateral Distance:    Right Eye Near:   Left Eye Near:    Bilateral Near:     Physical Exam Constitutional:       General: She is not in acute distress. HENT:     Head: Normocephalic and atraumatic.     Jaw: There is normal jaw occlusion. No tenderness or pain on movement.     Right Ear: Hearing, tympanic membrane, ear canal and external ear normal. No tenderness. No mastoid tenderness.     Left Ear: Hearing normal. No tenderness. No mastoid tenderness.     Ears:     Comments: Left ear with tragal tenderness.  EAC swollen, erythematous with scant discharge.  No blood.  TM intact    Nose: No nasal deformity, septal deviation or nasal tenderness.     Right Turbinates: Not swollen or pale.     Left Turbinates: Not swollen or pale.     Right Sinus: No maxillary sinus tenderness or frontal sinus tenderness.     Left Sinus: No maxillary sinus tenderness or frontal sinus tenderness.     Mouth/Throat:     Lips: Pink. No lesions.     Mouth: Mucous membranes are moist. No injury.     Pharynx: Oropharynx is clear. Uvula midline. No posterior oropharyngeal erythema or uvula swelling.     Comments: no tonsillar exudate or hypertrophy Cardiovascular:     Rate and Rhythm: Normal rate.  Pulmonary:     Effort: Pulmonary effort is normal.  Musculoskeletal:     Cervical back: Normal range of motion and neck supple. No muscular tenderness.  Lymphadenopathy:     Cervical: No cervical adenopathy.  Neurological:     Mental Status: She is alert and oriented to person, place, and time.      UC Treatments / Results  Labs (all labs ordered are listed, but only abnormal results are displayed) Labs Reviewed - No data to display  EKG   Radiology No results found.  Procedures Procedures (including critical care time)  Medications Ordered in UC Medications - No data to display  Initial Impression / Assessment and Plan / UC Course  I have reviewed the triage vital signs and the nursing notes.  Pertinent labs & imaging results that were available during my care of   the patient were reviewed by me and considered  in my medical decision making (see chart for details).     Patient has diffuse otitis externa of the left ear.  Will start Ciprodex as outlined below.  Provided good Rx coupon.  Return precautions discussed, patient verbalized understanding and is agreeable to plan. Final Clinical Impressions(s) / UC Diagnoses   Final diagnoses:  Acute diffuse otitis externa of left ear     Discharge Instructions     Use eardrops as prescribed for the next week. Return for worsening ear pain, swelling, discharge, bleeding, decreased hearing, development of jaw pain/swelling, fever.  Do NOT use Q-tips as these can cause your ear wax to get stuck, the tips may break off and become a foreign body requiring additional medical care, or puncture your eardrum.  Helpful prevention tip: Use a solution of equal parts isopropyl (rubbing) alcohol and white vinegar (acetic acid) in both ears after swimming.    ED Prescriptions    Medication Sig Dispense Auth. Provider   ciprofloxacin-dexamethasone (CIPRODEX) OTIC suspension Place 4 drops into both ears 2 (two) times daily. 7.5 mL Hall-Potvin, [place], PA-C     PDMP not reviewed this encounter.   Hall-Potvin, [place], [place] 09/12/19 1308

## 2020-02-01 ENCOUNTER — Emergency Department (HOSPITAL_COMMUNITY): Payer: Managed Care, Other (non HMO)

## 2020-02-01 ENCOUNTER — Emergency Department (HOSPITAL_COMMUNITY)
Admission: EM | Admit: 2020-02-01 | Discharge: 2020-02-01 | Disposition: A | Discharge status: Home / Self Care | Payer: Managed Care, Other (non HMO) | Attending: Emergency Medicine | Admitting: Emergency Medicine

## 2020-02-01 ENCOUNTER — Other Ambulatory Visit: Payer: Self-pay

## 2020-02-01 DIAGNOSIS — M79641 Pain in right hand: Secondary | ICD-10-CM

## 2020-02-01 DIAGNOSIS — I10 Essential (primary) hypertension: Secondary | ICD-10-CM | POA: Insufficient documentation

## 2020-02-01 DIAGNOSIS — Y93I9 Activity, other involving external motion: Secondary | ICD-10-CM | POA: Diagnosis not present

## 2020-02-01 DIAGNOSIS — W2209XA Striking against other stationary object, initial encounter: Secondary | ICD-10-CM | POA: Diagnosis not present

## 2020-02-01 NOTE — ED Notes (Signed)
Reviewed discharge instructions with patient. Follow-up care reviewed. Patient verbalized understanding. Patient A&Ox4, VSS, and ambulatory with steady gait upon discharge.  

## 2020-02-01 NOTE — ED Triage Notes (Signed)
Stated injured right hand against a door earlier today,

## 2020-02-01 NOTE — Discharge Instructions (Addendum)
Take Tylenol and ibuprofen as needed for pain. Apply ice and elevate hand. If symptoms are unresolved follow-up with orthopedics.  Your BP was elevated today. Follow up with a primary care provider for evaluation of your blood pressure.

## 2020-02-01 NOTE — ED Provider Notes (Signed)
[facility] EMERGENCY DEPARTMENT Provider Note   CSN: [id number] Arrival date & time: 02/01/20  1608    History Chief Complaint  Patient presents with  . Hand Injury    Gloria Kemp is a 24 y.o. female with past medical history significant for hypertension who presents for evaluation of right hand injury. Patient states she hit the volar aspect of her hand on a car door earlier today. Patient states she has had diffuse pain to the area since then. Has not noticed any redness, swelling or warmth. No pain to digits, wrist, forearm. She has not take anything for pain. Denies fever, chills, nausea, vomiting, chest pain, shortness of breath abdominal pain, paresthesias, weakness, decreased range of motion. Denies any lacerations, contusions or abrasions. Denies additional aggravating or alleviating factors.  History obtained from patient and past medical records. No interpreter is used.  HPI     Past Medical History:  Diagnosis Date  . Gestational diabetes   . Hypertension     Patient Active Problem List   Diagnosis Date Noted  . Labor and delivery, indication for care 04/05/2019  . IUGR (intrauterine growth restriction) affecting care of mother 02/12/2019  . Abnormal fetal ultrasound 01/22/2019  . Chronic benign essential hypertension, antepartum 01/08/2019  . Gestational diabetes 10/27/2018  . Obesity in pregnancy 10/19/2018  . Alpha thalassemia silent carrier 10/16/2018  . Supervision of normal first pregnancy 09/16/2018    Past Surgical History:  Procedure Laterality Date  . ADENOIDECTOMY    . EYE SURGERY       OB History    Gravida  1   Para  1   Term  1   Preterm      AB      Living  1     SAB      TAB      Ectopic      Multiple  0   Live Births  1           Family History  Problem Relation Age of Onset  . Hypertension Mother   . Obesity Mother   . Diabetes Father   . Hearing loss Father   . Hypertension Father   .  Obesity Father   . Stroke Paternal Aunt   . Kidney disease Paternal Uncle   . Hypertension Maternal Grandmother   . Obesity Maternal Grandmother   . Cancer Maternal Grandfather   . Arthritis Paternal Grandmother   . Diabetes Paternal Grandmother   . Hypertension Paternal Grandmother   . Miscarriages / Stillbirths Paternal Grandmother   . Obesity Paternal Grandmother     Social History   Tobacco Use  . Smoking status: Never Smoker  . Smokeless tobacco: Never Used  Vaping Use  . Vaping Use: Never used  Substance Use Topics  . Alcohol use: Not Currently  . Drug use: Never    Home Medications Prior to Admission medications   Medication Sig Start Date End Date Taking? Authorizing Provider  acetaminophen (TYLENOL) 325 MG tablet Take 2 tablets (650 mg total) by mouth every 4 (four) hours as needed (for pain scale < 4). Patient not taking: Reported on 04/23/2019 04/06/19   Chancy Milroy, MD  Blood Pressure Monitor KIT 1 Device by Does not apply route once a week. To be monitored Regularly at home. Patient not taking: Reported on 04/23/2019 12/02/18   Shelly Bombard, MD  Blood Pressure Monitoring KIT 1 kit by Does not apply route once a week. Patient  not taking: Reported on 04/23/2019 10/07/18   Emily Filbert, MD  ciprofloxacin-dexamethasone (CIPRODEX) OTIC suspension Place 4 drops into both ears 2 (two) times daily. Patient not taking: Reported on 02/01/2020 09/12/19   Hall-Potvin, [place], PA-C  enalapril (VASOTEC) 5 MG tablet Take 1 tablet (5 mg total) by mouth daily. Patient not taking: Reported on 02/01/2020 05/07/19   Constant, Peggy, MD  labetalol (NORMODYNE) 200 MG tablet TAKE 2 TABLETS (400 MG TOTAL) BY MOUTH 2 (TWO) TIMES DAILY. Patient not taking: Reported on 02/01/2020 05/18/19   Woodroe Mode, MD    Allergies    Patient has no known allergies.  Review of Systems   Review of Systems  Constitutional: Negative.   HENT: Negative.   Respiratory: Negative.   Cardiovascular:  Negative.   Gastrointestinal: Negative.   Genitourinary: Negative.   Musculoskeletal: Negative.        Dorsal right hand pain  Skin: Negative.   Neurological: Negative.   All other systems reviewed and are negative.   Physical Exam Updated Vital Signs BP (!) 160/108 (BP Location: Left Arm)   Pulse (!) 120   Temp 98.6 F (37 C) (Oral)   Resp 20   SpO2 98%   Physical Exam Vitals and nursing note reviewed.  Constitutional:      General: She is not in acute distress.    Appearance: She is well-developed. She is not ill-appearing, toxic-appearing or diaphoretic.  HENT:     Head: Normocephalic and atraumatic.     Mouth/Throat:     Mouth: Mucous membranes are moist.     Pharynx: Oropharynx is clear.  Eyes:     Pupils: Pupils are equal, round, and reactive to light.  Cardiovascular:     Rate and Rhythm: Normal rate.     Pulses: Normal pulses.     Heart sounds: Normal heart sounds.  Pulmonary:     Effort: Pulmonary effort is normal. No respiratory distress.     Breath sounds: Normal breath sounds.  Abdominal:     General: Bowel sounds are normal. There is no distension.  Musculoskeletal:        General: No swelling, deformity or signs of injury. Normal range of motion.     Right hand: Tenderness present. No swelling, deformity, lacerations or bony tenderness. Normal range of motion.     Left hand: Normal.     Cervical back: Normal range of motion.     Right lower leg: No edema.     Left lower leg: No edema.     Comments: Diffuse tenderness to dorsal aspect of right hand. Wiggles digits without difficulty. Able to flex, extend, pronate and supinate to bilateral wrists. No bony tenderness to radius or ulna. Full range of motion to bilateral elbows.  Skin:    General: Skin is warm and dry.     Capillary Refill: Capillary refill takes less than 2 seconds.     Comments: No edema, erythema or warmth. No fluctuance or induration.  Neurological:     General: No focal deficit  present.     Mental Status: She is alert.     Cranial Nerves: Cranial nerves are intact.     Sensory: Sensation is intact.     Motor: Motor function is intact.     Coordination: Coordination is intact.     Comments: Intact sensation Equal handgrip bilaterally     ED Results / Procedures / Treatments   Labs (all labs ordered are listed, but only abnormal results are  displayed) Labs Reviewed - No data to display  EKG None  Radiology DG Hand Complete Right  Result Date: 02/01/2020 CLINICAL DATA:  24 year old female with right hand pain. EXAM: RIGHT HAND - COMPLETE 3+ VIEW COMPARISON:  None. FINDINGS: There is no evidence of fracture or dislocation. There is no evidence of arthropathy or other focal bone abnormality. Soft tissues are unremarkable. IMPRESSION: Negative. Electronically Signed   By: Anner Crete M.D.   On: 02/01/2020 16:58   Procedures Procedures (including critical care time)  Medications Ordered in ED Medications - No data to display  ED Course  I have reviewed the triage vital signs and the nursing notes.  Pertinent labs & imaging results that were available during my care of the patient were reviewed by me and considered in my medical decision making (see chart for details).  24 year old presents for evaluation of right hand pain after hitting on cardio earlier today. Patient neurovascularly intact. She is afebrile, nonseptic, not ill-appearing. Does have some mild tenderness to dorsal right hand however has full range of motion to the wrist, no tenderness to forearm. Equal handgrip bilaterally. No edema, erythema or warmth. X-ray obtained from triage does not show any evidence of fracture, dislocation or effusion. I have low suspicion for septic joint, gout, hemarthrosis, fracture, dislocation, myositis, rhabdomyolysis, VTE. Patient initially tachycardic and hypertensive on arrival however tachycardia improved and hypertension has mildly improved. I low suspicion  for acute hypertensive urgency or emergency. We'll have her follow-up with her PCP for blood pressure. In regards to her hand injury I suggest RICE for symptomatic management. She'll follow-up with orthopedics if she has any new or worsening symptoms. Discussed follow-up.  The patient has been appropriately medically screened and/or stabilized in the ED. I have low suspicion for any other emergent medical condition which would require further screening, evaluation or treatment in the ED or require inpatient management.  Patient is hemodynamically stable and in no acute distress.  Patient able to ambulate in department prior to ED.  Evaluation does not show acute pathology that would require ongoing or additional emergent interventions while in the emergency department or further inpatient treatment.  I have discussed the diagnosis with the patient and answered all questions.  Pain is been managed while in the emergency department and patient has no further complaints prior to discharge.  Patient is comfortable with plan discussed in room and is stable for discharge at this time.  I have discussed strict return precautions for returning to the emergency department.  Patient was encouraged to follow-up with PCP/specialist refer to at discharge.   MDM Rules/Calculators/A&P                           Final Clinical Impression(s) / ED Diagnoses Final diagnoses:  Right hand pain    Rx / DC Orders ED Discharge Orders    None       Wanisha Shiroma A, PA-C 02/01/20 1951    Gareth Morgan, MD 02/02/20 [phone]

## 2020-03-26 NOTE — L&D Delivery Note (Signed)
Delivery Note At 1807 a viable female infant was delivered via SVD, presentation: LOA. APGAR: 6, 8; weight pending.   Placenta status: spontaneously delivered intact with gentle cord traction. Fundus firm with massage and Pitocin.   Anesthesia: none Lacerations: none Est. Blood Loss (mL): 300 Placenta to LD Complications none Cord ph n/a   Mom to postpartum. Baby to Couplet care / Skin to Skin.    Donette Larry, CNM 01/02/2021 6:27 PM

## 2020-04-01 IMAGING — US US MFM OB FOLLOW-UP
1 series · 13 of 28 positions shown · non-contrast
Comparison: none

[Series 1: us mfm ob follow-up · 78 acquisitions, 13 frames shown]
[im 3/78]
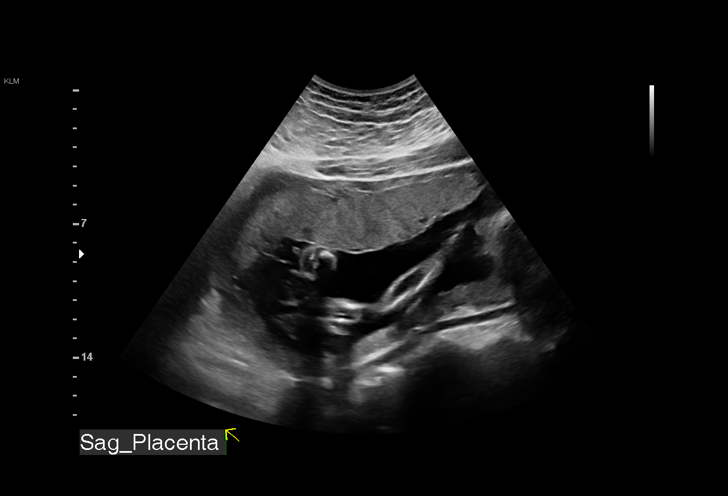
[im 9/78]
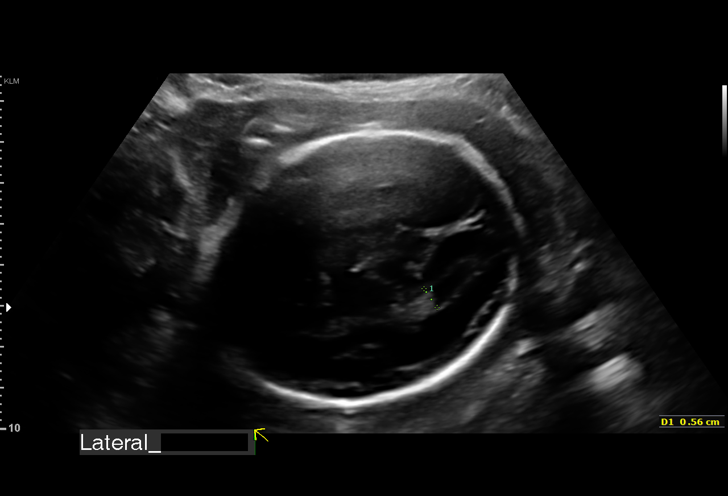
[im 15/78]
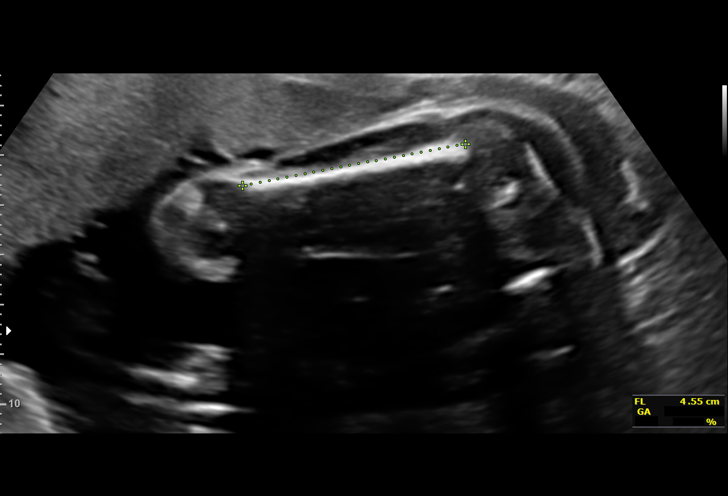
[im 20/78]
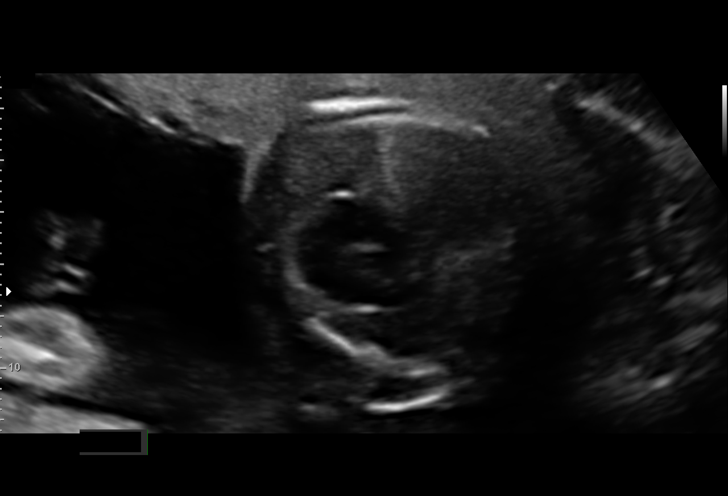
[im 26/78]
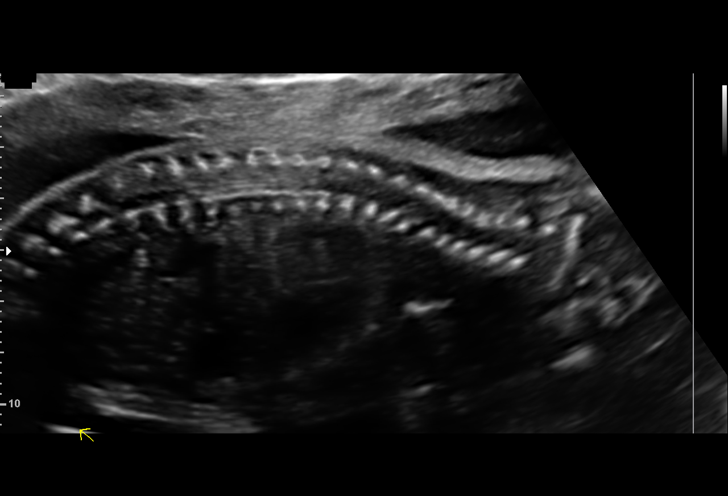
[im 32/78]
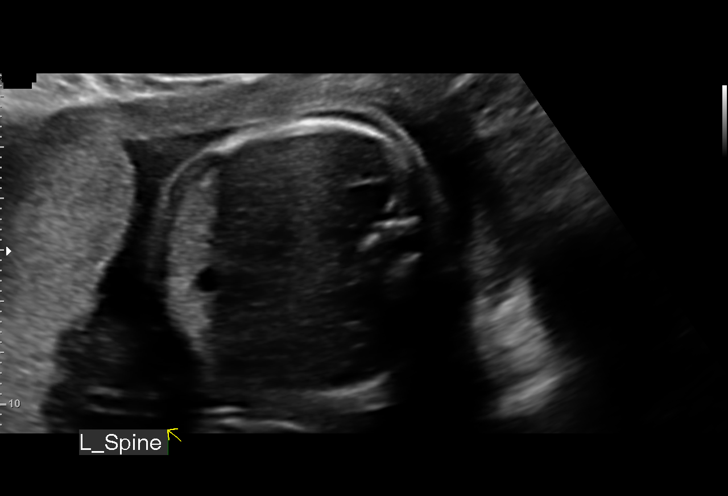
[im 40/78]
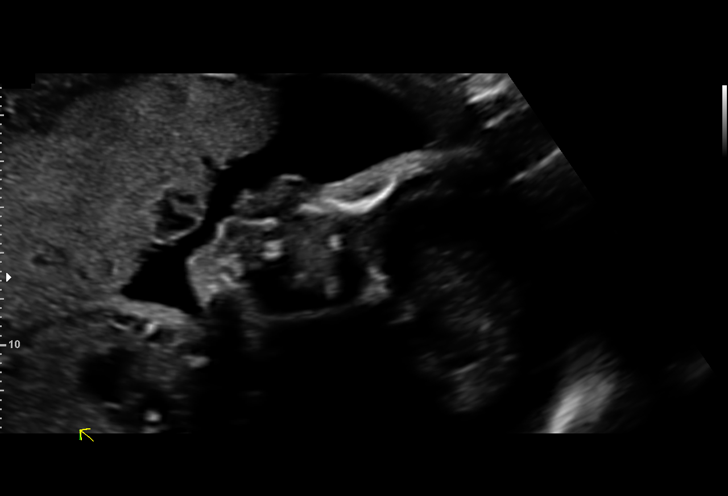
[im 46/78]
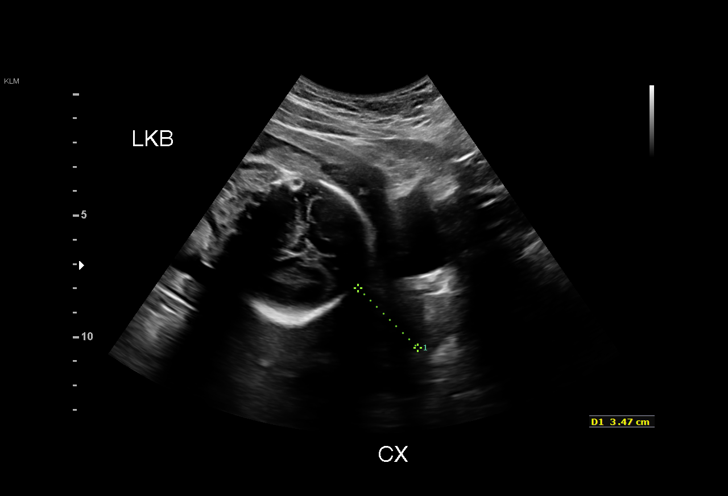
[im 52/78]
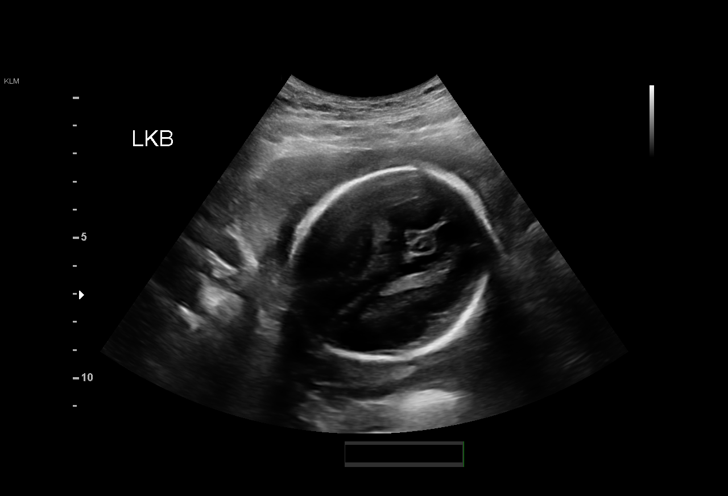
[im 58/78]
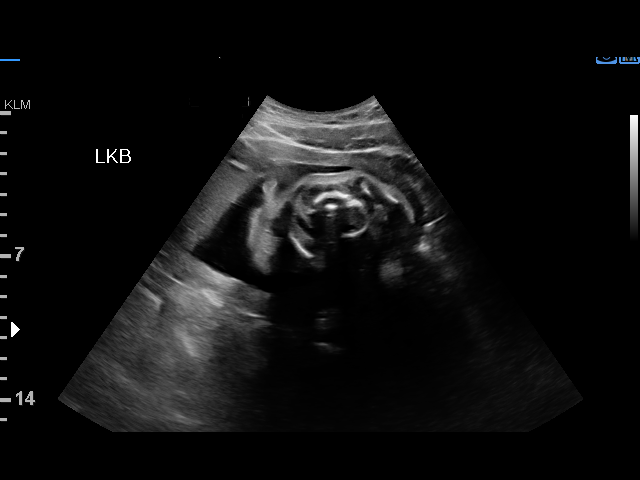
[im 63/78]
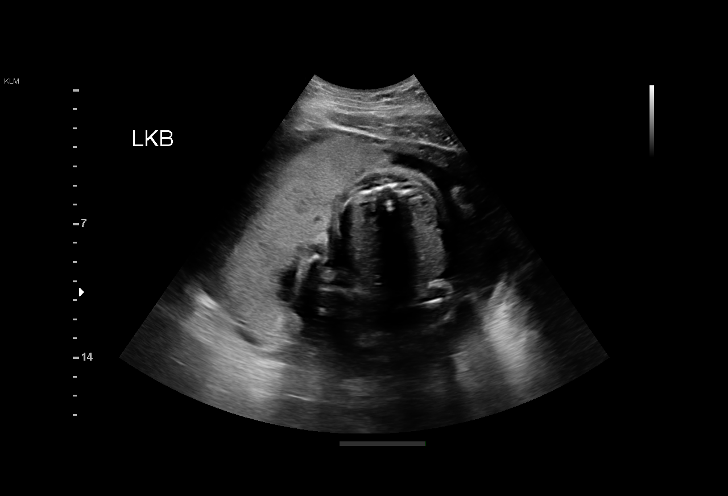
[im 69/78]
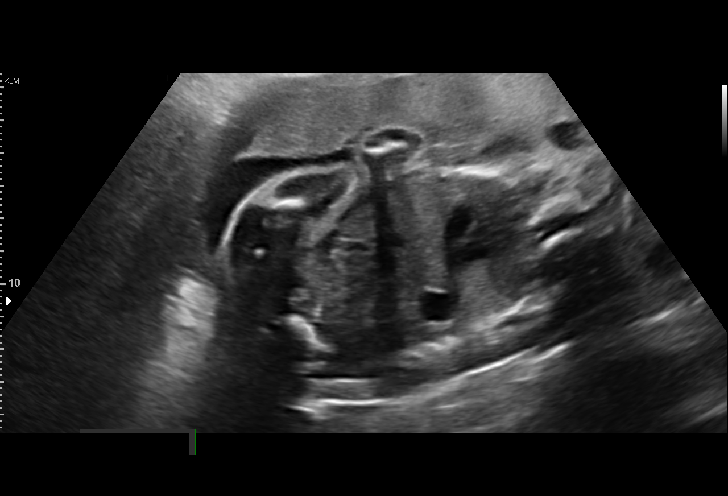
[im 75/78]
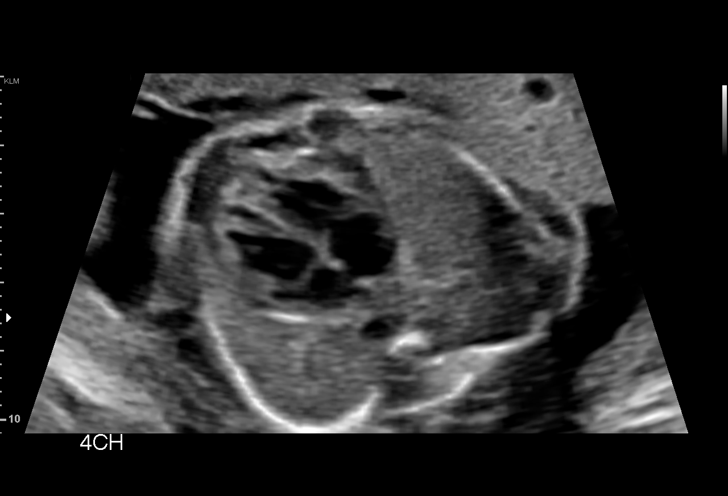

[13 of 28 positions shown; findings below may reference images not displayed]

----------------------------------------------------------------------

 ----------------------------------------------------------------------
Indications

  Gestational diabetes in pregnancy, diet
  controlled
  25 weeks gestation of pregnancy
  Antenatal follow-up for nonvisualized fetal
  anatomy
  Fetal abnormality - other known or
  suspected (abnormal ECHO- VSD)
 ----------------------------------------------------------------------
Fetal Evaluation

 Num Of Fetuses:         1
 Fetal Heart Rate(bpm):  149
 Cardiac Activity:       Observed
 Presentation:           Cephalic
 Placenta:               Anterior
 P. Cord Insertion:      Visualized

 Amniotic Fluid
 AFI FV:      Within normal limits
Biometry

 BPD:      64.3  mm     G. Age:  26w 0d         61  %    CI:        76.39   %    70 - 86
                                                         FL/HC:      19.9   %    18.7 -
 HC:      233.1  mm     G. Age:  25w 2d         25  %    HC/AC:      1.16        1.04 -
 AC:      201.5  mm     G. Age:  24w 5d         22  %    FL/BPD:     72.2   %    71 - 87
 FL:       46.4  mm     G. Age:  25w 3d         36  %    FL/AC:      23.0   %    20 - 24

 Est. FW:     774  gm    1 lb 11 oz      27  %
OB History
 Gravidity:    1         Term:   0        Prem:   0        SAB:   0
 TOP:          0       Ectopic:  0        Living: 0
Gestational Age

 U/S Today:     25w 3d                                        EDD:   04/26/19
 Best:          25w 3d     Det. By:  U/S  (11/19/18)          EDD:   04/26/19
Anatomy

 Cranium:               Appears normal         Aortic Arch:            Appears normal
 Cavum:                 Appears normal         Ductal Arch:            Appears normal
 Ventricles:            Appears normal         Diaphragm:              Previously seen
 Choroid Plexus:        Previously seen        Stomach:                Appears normal, left
                                                                       sided
 Cerebellum:            Previously seen        Abdomen:                Appears normal
 Posterior Fossa:       Previously seen        Abdominal Wall:         Previously seen
 Nuchal Fold:           Not applicable (>20    Cord Vessels:           Previously seen
                        wks GA)
 Face:                  Orbits and profile     Kidneys:                Appear normal
                        previously seen
 Lips:                  Previously seen        Bladder:                Appears normal
 Thoracic:              Appears normal         Spine:                  Appears normal
 Heart:                 Echogenic focus        Upper Extremities:      Previously seen
                        in LV
 RVOT:                  Appears normal         Lower Extremities:      Previously seen
 LVOT:                  Appears normal

 Other:  Fetus appears to be a male. Heels previously visualized. Technically
         difficult due to maternal habitus and fetal position.
Cervix Uterus Adnexa

 Cervix
 Not visualized (advanced GA >70wks)
Impression

 Ms. Soare return for completion of fetal anatomy.  She has
 gestational diabetes but not pregestational diabetes
 (hemoglobin A1c was 5.7%).  Gestational diabetes is well
 controlled on diet.
 On fetal echocardiography performed last month a small
 ventricular septal defect was suspected but not confirmed.
 She was counseled by MFM on echogenic cardiac focus and
 VSD.  After counseling the patient opted not to have
 amniocentesis.  Cell free fetal DNA screening was
 inconclusive because of low fetal fraction.

 Fetal growth is appropriate for gestational age. Fetal
 anatomical survey was completed and appears normal.  I
 could not clearly identify ventricular septal defect and
 informed her that my inability to see the defect does not rule
 out VSD.  She has a follow-up fetal echocardiography
 appointment with pediatric cardiologist next month.
Recommendations

 -An appointment was made for her to return in 4 weeks for
 fetal growth assessment.
                 Herr, Gen

## 2020-06-02 ENCOUNTER — Inpatient Hospital Stay (HOSPITAL_COMMUNITY)
Admission: AD | Admit: 2020-06-02 | Discharge: 2020-06-02 | Disposition: A | Discharge status: Home / Self Care | Payer: Managed Care, Other (non HMO) | Attending: Obstetrics & Gynecology | Admitting: Obstetrics & Gynecology

## 2020-06-02 ENCOUNTER — Other Ambulatory Visit: Payer: Self-pay

## 2020-06-02 DIAGNOSIS — N912 Amenorrhea, unspecified: Secondary | ICD-10-CM | POA: Insufficient documentation

## 2020-06-02 DIAGNOSIS — Z3200 Encounter for pregnancy test, result unknown: Secondary | ICD-10-CM

## 2020-06-02 NOTE — MAU Note (Signed)
Pt stated she took HPT and it was positive "immediately" Need documentation for work. Called office and was not called back so someone told her she could come here for confirmation. Pt denies any vag bleeding or pain. Repots she thinks she has a hemmeriod and has had some bleeding form that,

## 2020-06-02 NOTE — MAU Provider Note (Signed)
S:   25 y.o. G1P1001 @Unknown  by LMP presents to MAU for pregnancy confirmation.  She denies abdominal pain or vaginal bleeding today.    O: BP (!) 179/101   Pulse 99   Temp 98.3 F (36.8 C)   Resp 18   Ht 5' 1.75" (1.568 m)   Wt 96.2 kg   LMP 04/04/2020   BMI 39.09 kg/m  Physical Examination: General appearance - alert, well appearing, and in no distress, oriented to person, place, and time and acyanotic, in no respiratory distress  No results found for this or any previous visit (from the past 48 hour(s)).  A: Missed menses MSE done   P: D/C home Informed pt we do not do pregnancy verifications in MAU Referred to [facility] for pregnancy test & verification Return to MAU as needed for pregnancy related emergencies  [facility], NP 4:17 PM

## 2020-06-09 ENCOUNTER — Ambulatory Visit (INDEPENDENT_AMBULATORY_CARE_PROVIDER_SITE_OTHER): Payer: Managed Care, Other (non HMO)

## 2020-06-09 ENCOUNTER — Other Ambulatory Visit: Payer: Self-pay

## 2020-06-09 DIAGNOSIS — Z3200 Encounter for pregnancy test, result unknown: Secondary | ICD-10-CM | POA: Diagnosis not present

## 2020-06-09 LAB — POCT URINE PREGNANCY: Preg Test, Ur: POSITIVE — AB

## 2020-06-09 IMAGING — US US MFM FETAL BPP W/ NON-STRESS
1 series · 13 of 28 positions shown · non-contrast
Comparison: none

[Series 1: us mfm fetal bpp w/ non-stress · 36 acquisitions, 13 frames shown]
[im 2/36]
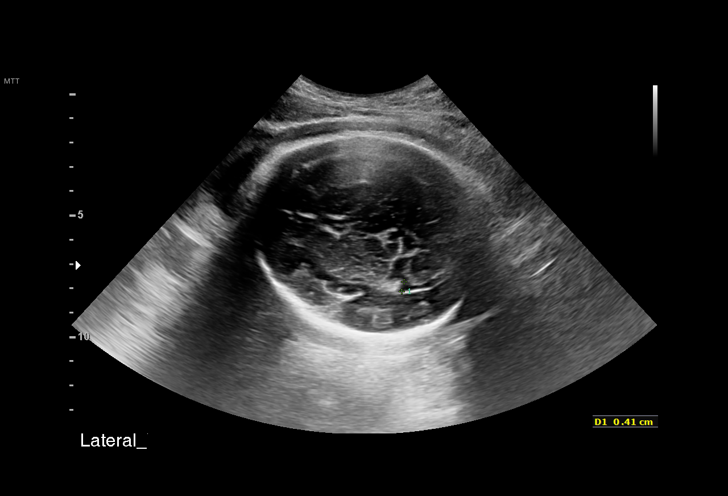
[im 4/36]
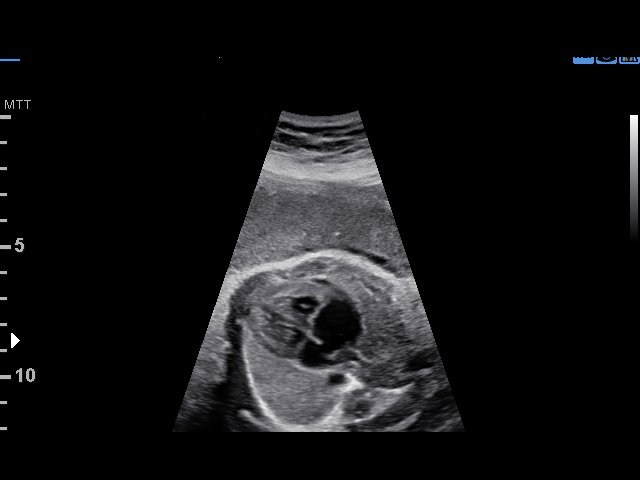
[im 7/36]
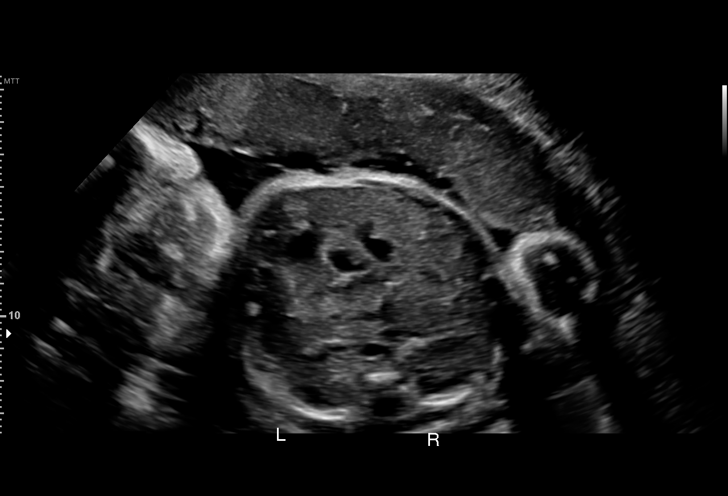
[im 10/36]
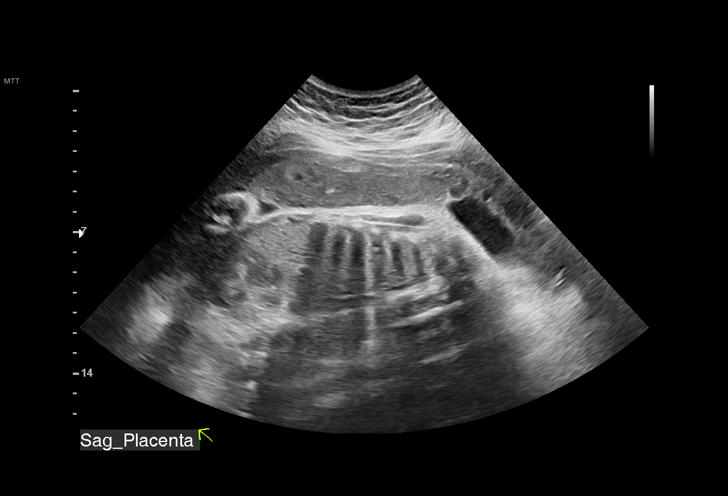
[im 12/36]
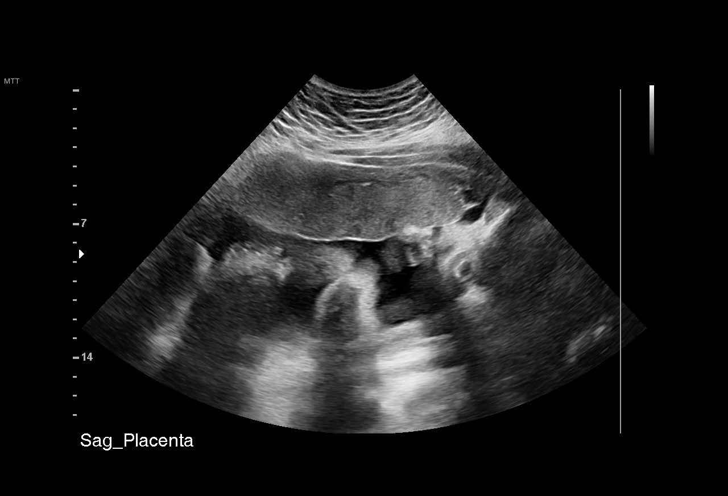
[im 15/36]
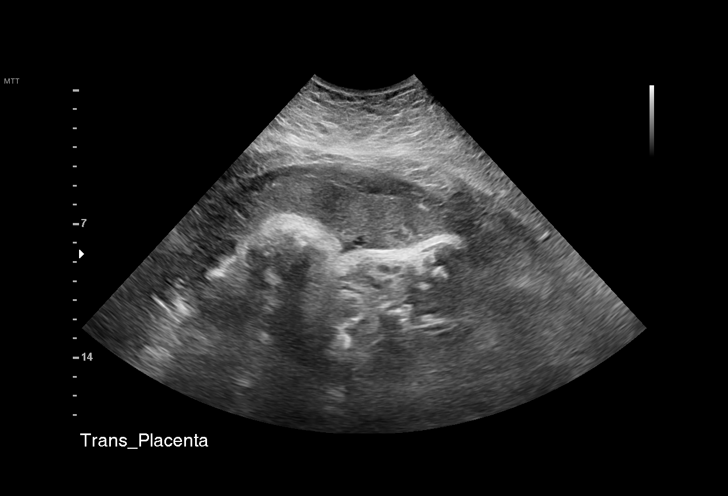
[im 19/36]
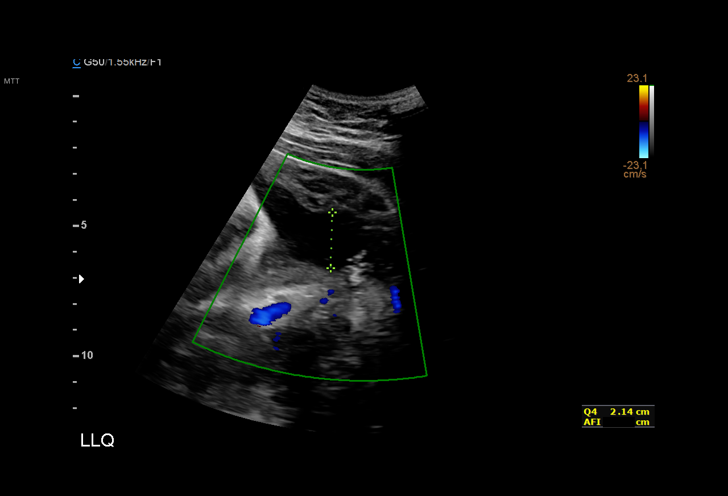
[im 21/36]
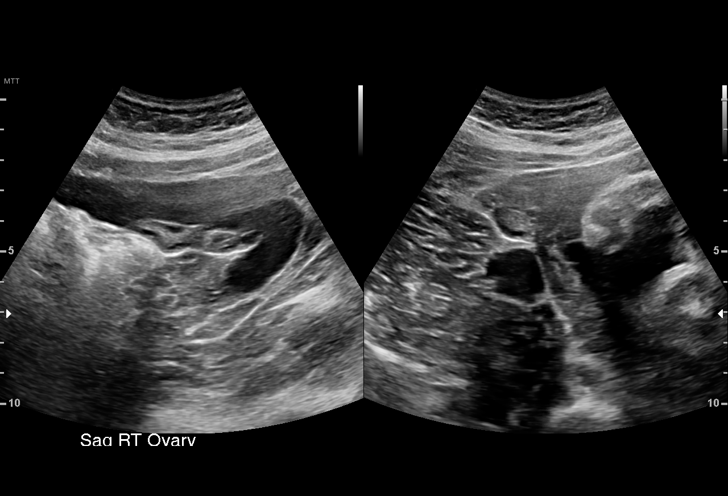
[im 24/36]
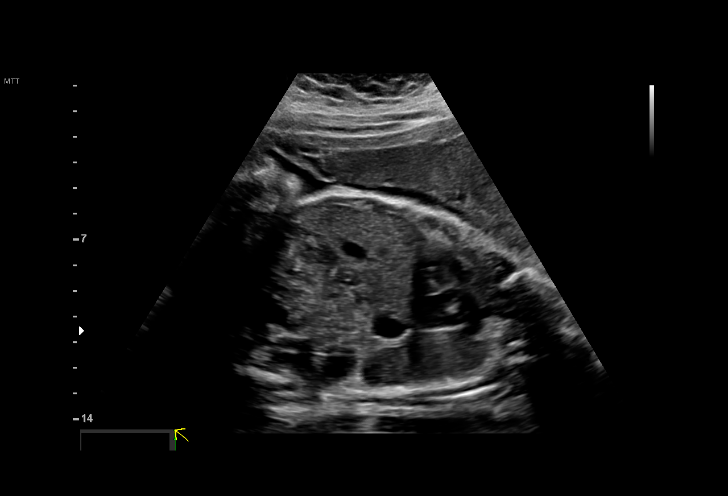
[im 26/36]
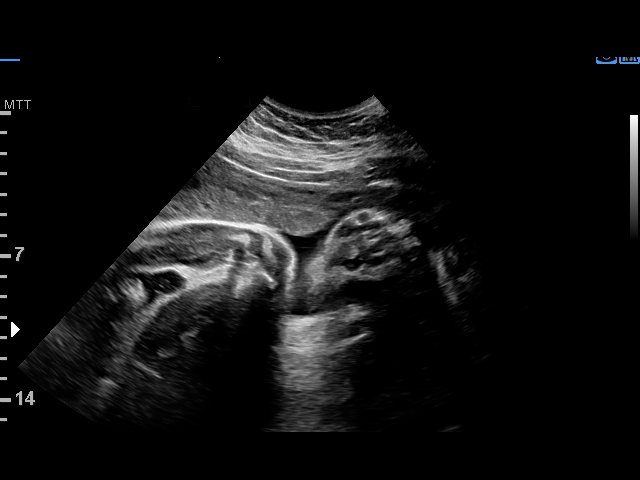
[im 29/36]
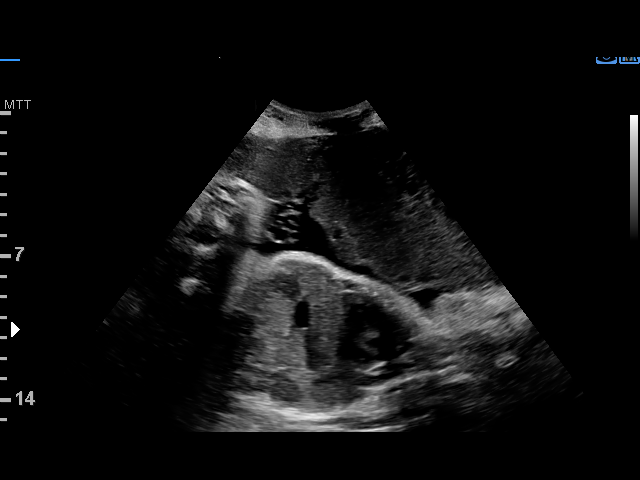
[im 32/36]
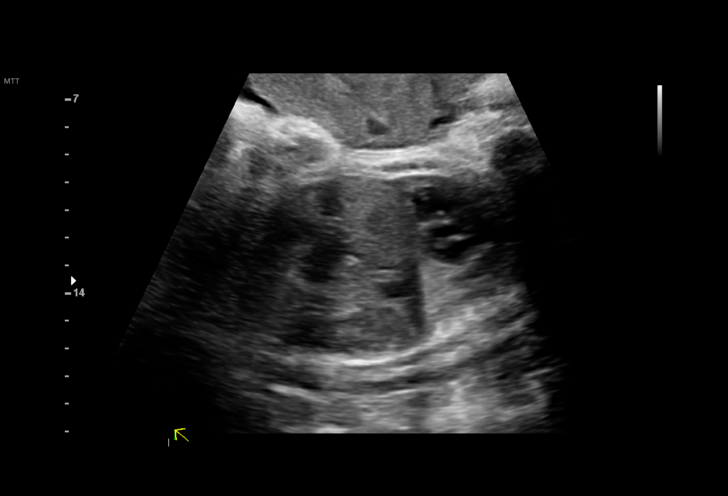
[im 34/36]
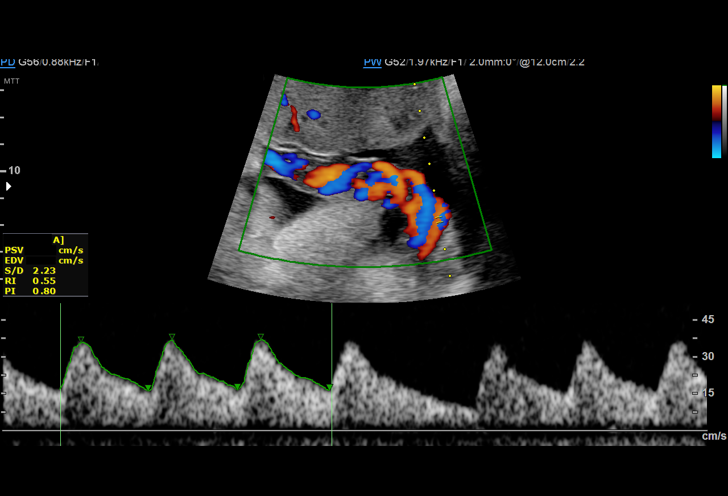

[13 of 28 positions shown; findings below may reference images not displayed]

W/NONSTRESS
 ----------------------------------------------------------------------

 ----------------------------------------------------------------------
Indications

  35 weeks gestation of pregnancy
  Encounter for other antenatal screening
  follow-up
  Maternal care for known or suspected poor
  fetal growth, third trimester, not applicable or
  unspecified IUGR
  Gestational diabetes in pregnancy, diet
  controlled
  Hypertension - Chronic/Pre-existing (ASA)
  Fetal abnormality - other known or
  suspected (abnormal ECHO- VSD)
 ----------------------------------------------------------------------
Fetal Evaluation

 Num Of Fetuses:         1
 Fetal Heart Rate(bpm):  143
 Cardiac Activity:       Observed
 Presentation:           Cephalic
 Placenta:               Anterior
 P. Cord Insertion:      Previously Visualized

 Amniotic Fluid
 AFI FV:      Within normal limits

 AFI Sum(cm)     %Tile       Largest Pocket(cm)
 10.46           24

 RUQ(cm)       RLQ(cm)       LUQ(cm)        LLQ(cm)

Biophysical Evaluation

 Amniotic F.V:   Pocket => 2 cm             F. Tone:        Observed
 F. Movement:    Observed                   N.S.T:          Reactive
 F. Breathing:   Not Observed               Score:          [DATE]
OB History

 Gravidity:    1         Term:   0        Prem:   0        SAB:   0
 TOP:          0       Ectopic:  0        Living: 0
Gestational Age

 Best:          35w 2d     Det. By:  U/S  (11/19/18)          EDD:   04/26/19
Anatomy

 Cranium:               Previously seen        Aortic Arch:            Previously seen
 Cavum:                 Previously seen        Ductal Arch:            Previously seen
 Ventricles:            Appears normal         Diaphragm:              Appears normal
 Choroid Plexus:        Previously seen        Stomach:                Appears normal, left
                                                                       sided
 Cerebellum:            Previously seen        Abdomen:                Previously seen
 Posterior Fossa:       Previously seen        Abdominal Wall:         Previously seen
 Nuchal Fold:           Not applicable (>20    Cord Vessels:           Previously seen
                        wks GA)
 Face:                  Orbits and profile     Kidneys:                Appear normal
                        previously seen
 Lips:                  Previously seen        Bladder:                Appears normal
 Thoracic:              Previously seen        Spine:                  Previously seen
 Heart:                 Echogenic focus        Upper Extremities:      Previously seen
                        in LV prev seen
 RVOT:                  Previously seen        Lower Extremities:      Previously seen
 LVOT:                  Previously seen

 Other:  Fetus appeared to be a male previously. Heels previously visualized.
         Technically difficult due to maternal habitus and fetal position.
Doppler - Fetal Vessels

 Umbilical Artery
  S/D     %tile     RI              PI                     ADFV    RDFV
 2.52       55     0.6            0.93                        No      No

Cervix Uterus Adnexa

 Cervix
 Not visualized (advanced GA >73wks)

 Uterus
 No abnormality visualized.

 Left Ovary
 Within normal limits. No adnexal mass visualized.

 Right Ovary
 Within normal limits. No adnexal mass visualized.
 Cul De Sac
 No free fluid seen.

 Adnexa
 No abnormality visualized.
Impression

 Patient with severe fetal growth restriction return for antenatal
 testing.  She has chronic hypertension and gestational
 diabetes (diet-controlled).
 Blood pressures at our office or 144/103, 160/95, 146/94
 mmHg.  She does not have symptoms of severe headache,
 visual disturbances, right upper quadrant pain or vaginal
 bleeding.  She reports good fetal movements.
 On ultrasound, amniotic fluid is normal and good fetal activity
 seen.  Fetal breathing movements did not meet criteria for
 BPP.  Umbilical artery Doppler showed normal forward
 diastolic flow.  NST is reactive.  BPP [DATE].
 Patient has blood pressure cuff at home.  I discussed the
 parameters and asked her to contact your service or come to
 the hospital if she has any symptoms mentioned above.
 Antihypertensives may be initiated she has blood pressure
 ranges of 155 to 160 mmHg or diastolic blood pressure of
 105 mmHg or greater.
 Will make recommendations about delivery after the next
 fetal growth assessment in 2 weeks.
Recommendations

 --Continue weekly BPP and Doppler.
                 Dokk, Geir-Tore

## 2020-06-09 NOTE — Progress Notes (Signed)
Ms. Dysart presents today for UPT. She does not have any complaints at this time.  LMP: 04/04/2020    OBJECTIVE: Appears well, in no apparent distress.  OB History    Gravida  1   Para  1   Term  1   Preterm      AB      Living  1     SAB      IAB      Ectopic      Multiple  0   Live Births  1          Home UPT Result: positive In-Office UPT result:positive I have reviewed the patient's medical, obstetrical, social, and family histories, and medications.   ASSESSMENT: Positive pregnancy test  PLAN Prenatal care to be completed at:  Will start care at [facility]

## 2020-06-23 ENCOUNTER — Ambulatory Visit (INDEPENDENT_AMBULATORY_CARE_PROVIDER_SITE_OTHER): Payer: Managed Care, Other (non HMO)

## 2020-06-23 ENCOUNTER — Other Ambulatory Visit: Payer: Self-pay

## 2020-06-23 VITALS — BP 171/113 | HR 112 | Ht 62.0 in | Wt 218.6 lb

## 2020-06-23 DIAGNOSIS — Z789 Other specified health status: Secondary | ICD-10-CM | POA: Diagnosis not present

## 2020-06-23 DIAGNOSIS — O3680X0 Pregnancy with inconclusive fetal viability, not applicable or unspecified: Secondary | ICD-10-CM

## 2020-06-23 DIAGNOSIS — I10 Essential (primary) hypertension: Secondary | ICD-10-CM

## 2020-06-23 DIAGNOSIS — Z3481 Encounter for supervision of other normal pregnancy, first trimester: Secondary | ICD-10-CM

## 2020-06-23 DIAGNOSIS — O0990 Supervision of high risk pregnancy, unspecified, unspecified trimester: Secondary | ICD-10-CM | POA: Insufficient documentation

## 2020-06-23 DIAGNOSIS — Z3491 Encounter for supervision of normal pregnancy, unspecified, first trimester: Secondary | ICD-10-CM | POA: Insufficient documentation

## 2020-06-23 MED ORDER — NIFEDIPINE ER OSMOTIC RELEASE 30 MG PO TB24: 30.0000 mg | ORAL_TABLET | Freq: Every day | ORAL | 2 refills | Status: DC

## 2020-06-23 NOTE — Progress Notes (Signed)
PRENATAL INTAKE SUMMARY  Ms. Hardiman presents today New OB Nurse Interview.  OB History    Gravida  2   Para  1   Term  1   Preterm      AB      Living  1     SAB      IAB      Ectopic      Multiple  0   Live Births  1          I have reviewed the patient's medical, obstetrical, social, and family histories, medications, and available lab results.  SUBJECTIVE She has no unusual complaints  OBJECTIVE Initial Physical Exam (New OB)  GENERAL APPEARANCE: alert, well appearing   ASSESSMENT Normal pregnancy  PLAN Prenatal care to be completed at [facility] All New OB labs to be completed at [facility] provider visit Baby Scripts Ordered Patient has BP cuff at home U/S performed today reveals single live IUP at [redacted]w[redacted]d by CRL. FHR 152 PHQ2 score: 0 GAD 7 score: 0 Procardia sent to patient pharmacy per Dr. Debroah Loop for chronic hypertension.

## 2020-06-28 ENCOUNTER — Other Ambulatory Visit (HOSPITAL_COMMUNITY)
Admission: RE | Admit: 2020-06-28 | Discharge: 2020-06-28 | Disposition: A | Discharge status: Home / Self Care | Payer: Managed Care, Other (non HMO) | Source: Ambulatory Visit | Attending: Obstetrics and Gynecology | Admitting: Obstetrics and Gynecology

## 2020-06-28 ENCOUNTER — Other Ambulatory Visit: Payer: Self-pay

## 2020-06-28 ENCOUNTER — Encounter: Payer: Self-pay | Admitting: Obstetrics and Gynecology

## 2020-06-28 ENCOUNTER — Ambulatory Visit (INDEPENDENT_AMBULATORY_CARE_PROVIDER_SITE_OTHER): Payer: Managed Care, Other (non HMO) | Admitting: Obstetrics and Gynecology

## 2020-06-28 VITALS — BP 139/88 | HR 128 | Wt 212.0 lb

## 2020-06-28 DIAGNOSIS — Z8632 Personal history of gestational diabetes: Secondary | ICD-10-CM | POA: Insufficient documentation

## 2020-06-28 DIAGNOSIS — Z3481 Encounter for supervision of other normal pregnancy, first trimester: Secondary | ICD-10-CM | POA: Diagnosis present

## 2020-06-28 DIAGNOSIS — O10019 Pre-existing essential hypertension complicating pregnancy, unspecified trimester: Secondary | ICD-10-CM

## 2020-06-28 DIAGNOSIS — Z8759 Personal history of other complications of pregnancy, childbirth and the puerperium: Secondary | ICD-10-CM | POA: Insufficient documentation

## 2020-06-28 HISTORY — DX: Personal history of gestational diabetes: Z86.32

## 2020-06-28 HISTORY — DX: Personal history of other complications of pregnancy, childbirth and the puerperium: Z87.59

## 2020-06-28 MED ORDER — ASPIRIN EC 81 MG PO TBEC: 81.0000 mg | DELAYED_RELEASE_TABLET | Freq: Every day | ORAL | 2 refills | Status: DC

## 2020-06-28 NOTE — Patient Instructions (Signed)
Obstetrics: Normal and Problem Pregnancies (7th ed., pp. 102-121). [place], [place]: Elsevier."> Textbook of Family Medicine (9th ed., pp. 365-410). [place], [place]: [employer].">  First Trimester of Pregnancy  The first trimester of pregnancy starts on the first day of your last menstrual period until the end of week 12. This is months 1 through 3 of pregnancy. A week after a sperm fertilizes an egg, the egg will implant into the wall of the uterus and begin to develop into a baby. By the end of 12 weeks, all the baby's organs will be formed and the baby will be 2-3 inches in size. Body changes during your first trimester Your body goes through many changes during pregnancy. The changes vary and generally return to normal after your baby is born. Physical changes  You may gain or lose weight.  Your breasts may begin to grow larger and become tender. The tissue that surrounds your nipples (areola) may become darker.  Dark spots or blotches (chloasma or mask of pregnancy) may develop on your face.  You may have changes in your hair. These can include thickening or thinning of your hair or changes in texture. Health changes  You may feel nauseous, and you may vomit.  You may have heartburn.  You may develop headaches.  You may develop constipation.  Your gums may bleed and may be sensitive to brushing and flossing. Other changes  You may tire easily.  You may urinate more often.  Your menstrual periods will stop.  You may have a loss of appetite.  You may develop cravings for certain kinds of food.  You may have changes in your emotions from day to day.  You may have more vivid and strange dreams. Follow these instructions at home: Medicines  Follow your health care provider's instructions regarding medicine use. Specific medicines may be either safe or unsafe to take during pregnancy. Do not take any medicines unless told to by your health care provider.  Take a  prenatal vitamin that contains at least 600 micrograms (mcg) of folic acid. Eating and drinking  Eat a healthy diet that includes fresh fruits and vegetables, whole grains, good sources of protein such as meat, eggs, or tofu, and low-fat dairy products.  Avoid raw meat and unpasteurized juice, milk, and cheese. These carry germs that can harm you and your baby.  If you feel nauseous or you vomit: ? Eat 4 or 5 small meals a day instead of 3 large meals. ? Try eating a few soda crackers. ? Drink liquids between meals instead of during meals.  You may need to take these actions to prevent or treat constipation: ? Drink enough fluid to keep your urine pale yellow. ? Eat foods that are high in fiber, such as beans, whole grains, and fresh fruits and vegetables. ? Limit foods that are high in fat and processed sugars, such as fried or sweet foods. Activity  Exercise only as directed by your health care provider. Most people can continue their usual exercise routine during pregnancy. Try to exercise for 30 minutes at least 5 days a week.  Stop exercising if you develop pain or cramping in the lower abdomen or lower back.  Avoid exercising if it is very hot or humid or if you are at high altitude.  Avoid heavy lifting.  If you choose to, you may have sex unless your health care provider tells you not to. Relieving pain and discomfort  Wear a good support bra to relieve breast   tenderness.  Rest with your legs elevated if you have leg cramps or low back pain.  If you develop bulging veins (varicose veins) in your legs: ? Wear support hose as told by your health care provider. ? Elevate your feet for 15 minutes, 3-4 times a day. ? Limit salt in your diet. Safety  Wear your seat belt at all times when driving or riding in a car.  Talk with your health care provider if someone is verbally or physically abusive to you.  Talk with your health care provider if you are feeling sad or have  thoughts of hurting yourself. Lifestyle  Do not use hot tubs, steam rooms, or saunas.  Do not douche. Do not use tampons or scented sanitary pads.  Do not use herbal remedies, alcohol, illegal drugs, or medicines that are not approved by your health care provider. Chemicals in these products can harm your baby.  Do not use any products that contain nicotine or tobacco, such as cigarettes, e-cigarettes, and chewing tobacco. If you need help quitting, ask your health care provider.  Avoid cat litter boxes and soil used by cats. These carry germs that can cause birth defects in the baby and possibly loss of the unborn baby (fetus) by miscarriage or stillbirth. General instructions  During routine prenatal visits in the first trimester, your health care provider will do a physical exam, perform necessary tests, and ask you how things are going. Keep all follow-up visits. This is important.  Ask for help if you have counseling or nutritional needs during pregnancy. Your health care provider can offer advice or refer you to specialists for help with various needs.  Schedule a dentist appointment. At home, brush your teeth with a soft toothbrush. Floss gently.  Write down your questions. Take them to your prenatal visits. Where to find more information  American Pregnancy Association: americanpregnancy.org  American College of Obstetricians and Gynecologists: acog.org/en/Womens%20Health/Pregnancy  Office on Women's Health: womenshealth.gov/pregnancy Contact a health care provider if you have:  Dizziness.  A fever.  Mild pelvic cramps, pelvic pressure, or nagging pain in the abdominal area.  Nausea, vomiting, or diarrhea that lasts for 24 hours or longer.  A bad-smelling vaginal discharge.  Pain when you urinate.  Known exposure to a contagious illness, such as chickenpox, measles, Zika virus, HIV, or hepatitis. Get help right away if you have:  Spotting or bleeding from your  vagina.  Severe abdominal cramping or pain.  Shortness of breath or chest pain.  Any kind of trauma, such as from a fall or a car crash.  New or increased pain, swelling, or redness in an arm or leg. Summary  The first trimester of pregnancy starts on the first day of your last menstrual period until the end of week 12 (months 1 through 3).  Eating 4 or 5 small meals a day rather than 3 large meals may help to relieve nausea and vomiting.  Do not use any products that contain nicotine or tobacco, such as cigarettes, e-cigarettes, and chewing tobacco. If you need help quitting, ask your health care provider.  Keep all follow-up visits. This is important. This information is not intended to replace advice given to you by your health care provider. Make sure you discuss any questions you have with your health care provider. Document Revised: 08/19/2019 Document Reviewed: 06/25/2019 Elsevier Patient Education  2021 Elsevier Inc.  

## 2020-06-28 NOTE — Progress Notes (Signed)
Subjective:  Gloria Kemp is a 25 y.o. G2P1001 at 81w1dbeing seen today for her first OB visit. EDD by LMP and confirmed by first trimester U/S. Chronic HTN on Procardia. H/O FGR and GDM ( diet control) with last preg. IOL at 37 weeks with last pregnancy d/t FGR.   She is currently monitored for the following issues for this high-risk pregnancy and has Alpha thalassemia silent carrier; Obesity in pregnancy; Chronic benign essential hypertension, antepartum; Encounter for supervision of normal pregnancy in first trimester; History of gestational diabetes; and History of prior pregnancy with IUGR newborn on their problem list.  Patient reports no complaints.  Contractions: Not present. Vag. Bleeding: None.   . Denies leaking of fluid.   The following portions of the patient's history were reviewed and updated as appropriate: allergies, current medications, past family history, past medical history, past social history, past surgical history and problem list. Problem list updated.  Objective:   Vitals:   06/28/20 1520  BP: 139/88  Pulse: (!) 128  Weight: 212 lb (96.2 kg)    Fetal Status: Fetal Heart Rate (bpm): 154         General:  Alert, oriented and cooperative. Patient is in no acute distress.  Skin: Skin is warm and dry. No rash noted.   Cardiovascular: Normal heart rate noted  Respiratory: Normal respiratory effort, no problems with respiration noted  Abdomen: Soft, gravid, appropriate for gestational age. Pain/Pressure: Present     Pelvic:  Cervical exam performed        Extremities: Normal range of motion.  Edema: None  Mental Status: Normal mood and affect. Normal behavior. Normal judgment and thought content.   Urinalysis:      Assessment and Plan:  Pregnancy: G2P1001 at 149w1d1. Encounter for supervision of other normal pregnancy in first trimester Prenatal care and labs reviewed with pt. - Culture, OB Urine - CBC/D/Plt+RPR+Rh+ABO+Rub Ab... - Cervicovaginal ancillary  only( [facility] [place] COMP + 14 WK; Future  2. Chronic benign essential hypertension, antepartum CHTN and pregnancy reviewed with pt. Importance of taking medications, BASA, diet, exercise, monitoring BP and recording in BaPit[employer] Bowesiscussed with pt Growth scans and antenatal testing reviewed with pt  - Comp Met (CMET) - Protein / creatinine ratio, urine  3. History of gestational diabetes Diet and exercise reviewed - HgB A1c  4. History of prior pregnancy with IUGR newborn  - aspirin EC 81 MG tablet; Take 1 tablet (81 mg total) by mouth daily. Take after 12 weeks for prevention of preeclampsia later in pregnancy  Dispense: 300 tablet; Refill: 2  Preterm labor symptoms and general obstetric precautions including but not limited to vaginal bleeding, contractions, leaking of fluid and fetal movement were reviewed in detail with the patient. Please refer to After Visit Summary for other counseling recommendations.  Return in about 4 weeks (around 07/26/2020) for face to face, MD only, OB visit.   ErChancy MilroyMD

## 2020-06-28 NOTE — Progress Notes (Signed)
NOB, c/o back pain 6/10 x 3 months.  Declined Genetic Screening

## 2020-06-29 ENCOUNTER — Telehealth: Payer: Self-pay

## 2020-06-29 LAB — CBC/D/PLT+RPR+RH+ABO+RUB AB...
Antibody Screen: NEGATIVE
Basophils Absolute: 0 10*3/uL (ref 0.0–0.2)
Basos: 0 %
EOS (ABSOLUTE): 0.1 10*3/uL (ref 0.0–0.4)
Eos: 1 %
HCV Ab: <0.1 s/co ratio (ref 0.0–0.9)
HIV Screen 4th Generation wRfx: NONREACTIVE
Hematocrit: 38.5 % (ref 34.0–46.6)
Hemoglobin: 12.6 g/dL (ref 11.1–15.9)
Hepatitis B Surface Ag: NEGATIVE
Immature Grans (Abs): 0 10*3/uL (ref 0.0–0.1)
Immature Granulocytes: 0 %
Lymphocytes Absolute: 2.6 10*3/uL (ref 0.7–3.1)
Lymphs: 23 %
MCH: 25.4 pg — ABNORMAL LOW (ref 26.6–33.0)
MCHC: 32.7 g/dL (ref 31.5–35.7)
MCV: 78 fL — ABNORMAL LOW (ref 79–97)
Monocytes Absolute: 0.8 10*3/uL (ref 0.1–0.9)
Monocytes: 7 %
Neutrophils Absolute: 7.8 10*3/uL — ABNORMAL HIGH (ref 1.4–7.0)
Neutrophils: 69 %
Platelets: 262 10*3/uL (ref 150–450)
RBC: 4.97 x10E6/uL (ref 3.77–5.28)
RDW: 14.1 % (ref 11.7–15.4)
RPR Ser Ql: NONREACTIVE
Rh Factor: POSITIVE
Rubella Antibodies, IGG: 5.3 index (ref >0.99)
WBC: 11.4 10*3/uL — ABNORMAL HIGH (ref 3.4–10.8)

## 2020-06-29 LAB — COMPREHENSIVE METABOLIC PANEL
ALT: 14 IU/L (ref 0–32)
AST: 14 IU/L (ref 0–40)
Albumin/Globulin Ratio: 1.5 (ref 1.2–2.2)
Albumin: 4.2 g/dL (ref 3.9–5.0)
Alkaline Phosphatase: 65 IU/L (ref 44–121)
BUN/Creatinine Ratio: 17 (ref 9–23)
BUN: 10 mg/dL (ref 6–20)
Bilirubin Total: <0.2 mg/dL (ref 0.0–1.2)
CO2: 17 mmol/L — ABNORMAL LOW (ref 20–29)
Calcium: 10.4 mg/dL — ABNORMAL HIGH (ref 8.7–10.2)
Chloride: 103 mmol/L (ref 96–106)
Creatinine, Ser: 0.6 mg/dL (ref 0.57–1.00)
Globulin, Total: 2.8 g/dL (ref 1.5–4.5)
Glucose: 113 mg/dL — ABNORMAL HIGH (ref 65–99)
Potassium: 4.3 mmol/L (ref 3.5–5.2)
Sodium: 139 mmol/L (ref 134–144)
Total Protein: 7 g/dL (ref 6.0–8.5)
eGFR: 129 mL/min/{1.73_m2} (ref >59)

## 2020-06-29 LAB — HEMOGLOBIN A1C
Est. average glucose Bld gHb Est-mCnc: 131 mg/dL
Hgb A1c MFr Bld: 6.2 % — ABNORMAL HIGH (ref 4.8–5.6)

## 2020-06-29 LAB — CERVICOVAGINAL ANCILLARY ONLY
Bacterial Vaginitis (gardnerella): POSITIVE — AB
Candida Glabrata: NEGATIVE
Candida Vaginitis: NEGATIVE
Chlamydia: NEGATIVE
Comment: NEGATIVE
Comment: NEGATIVE
Comment: NEGATIVE
Comment: NEGATIVE
Comment: NEGATIVE
Comment: NORMAL
Neisseria Gonorrhea: NEGATIVE
Trichomonas: NEGATIVE

## 2020-06-29 LAB — PROTEIN / CREATININE RATIO, URINE
Creatinine, Urine: 237.8 mg/dL
Protein, Ur: 132.2 mg/dL
Protein/Creat Ratio: 556 mg/g creat — ABNORMAL HIGH (ref 0–200)

## 2020-06-29 LAB — HCV INTERPRETATION

## 2020-06-29 NOTE — Telephone Encounter (Signed)
Attempted to call about paperwork that was dropped off. No answer, unable to leave vm

## 2020-06-30 ENCOUNTER — Other Ambulatory Visit: Payer: Self-pay | Admitting: Obstetrics & Gynecology

## 2020-06-30 DIAGNOSIS — I10 Essential (primary) hypertension: Secondary | ICD-10-CM

## 2020-06-30 LAB — URINE CULTURE, OB REFLEX

## 2020-06-30 LAB — CULTURE, OB URINE

## 2020-07-01 ENCOUNTER — Other Ambulatory Visit: Payer: Self-pay | Admitting: Obstetrics and Gynecology

## 2020-07-01 DIAGNOSIS — B9689 Other specified bacterial agents as the cause of diseases classified elsewhere: Secondary | ICD-10-CM

## 2020-07-01 DIAGNOSIS — N760 Acute vaginitis: Secondary | ICD-10-CM

## 2020-07-01 MED ORDER — METRONIDAZOLE 500 MG PO TABS: 500.0000 mg | ORAL_TABLET | Freq: Two times a day (BID) | ORAL | 0 refills | Status: DC

## 2020-07-04 ENCOUNTER — Other Ambulatory Visit: Payer: Self-pay

## 2020-07-04 MED ORDER — METRONIDAZOLE 1 % EX GEL: Freq: Every day | CUTANEOUS | 0 refills | Status: DC

## 2020-07-04 NOTE — Telephone Encounter (Signed)
Patient does not tolerate flagyl pills. She would like to try the gel. I have advised her that insurance may not cover this medications but I will do my best to get approve for patient.

## 2020-07-26 ENCOUNTER — Other Ambulatory Visit: Payer: Managed Care, Other (non HMO)

## 2020-07-26 ENCOUNTER — Encounter: Payer: Managed Care, Other (non HMO) | Admitting: Obstetrics and Gynecology

## 2020-08-08 ENCOUNTER — Encounter: Payer: Self-pay | Admitting: Obstetrics & Gynecology

## 2020-08-08 ENCOUNTER — Ambulatory Visit: Payer: Managed Care, Other (non HMO) | Admitting: Obstetrics & Gynecology

## 2020-08-08 ENCOUNTER — Other Ambulatory Visit: Payer: Self-pay

## 2020-08-08 VITALS — BP 142/90 | HR 112 | Wt 209.0 lb

## 2020-08-08 DIAGNOSIS — Z3A18 18 weeks gestation of pregnancy: Secondary | ICD-10-CM

## 2020-08-08 DIAGNOSIS — O10019 Pre-existing essential hypertension complicating pregnancy, unspecified trimester: Secondary | ICD-10-CM

## 2020-08-08 DIAGNOSIS — R7303 Prediabetes: Secondary | ICD-10-CM | POA: Insufficient documentation

## 2020-08-08 DIAGNOSIS — Z8279 Family history of other congenital malformations, deformations and chromosomal abnormalities: Secondary | ICD-10-CM

## 2020-08-08 DIAGNOSIS — O24419 Gestational diabetes mellitus in pregnancy, unspecified control: Secondary | ICD-10-CM

## 2020-08-08 DIAGNOSIS — Z8759 Personal history of other complications of pregnancy, childbirth and the puerperium: Secondary | ICD-10-CM

## 2020-08-08 DIAGNOSIS — Z8632 Personal history of gestational diabetes: Secondary | ICD-10-CM

## 2020-08-08 DIAGNOSIS — O0992 Supervision of high risk pregnancy, unspecified, second trimester: Secondary | ICD-10-CM

## 2020-08-08 MED ORDER — NIFEDIPINE ER OSMOTIC RELEASE 60 MG PO TB24: 60.0000 mg | ORAL_TABLET | Freq: Every day | ORAL | 2 refills | Status: DC

## 2020-08-08 NOTE — Progress Notes (Signed)
   PRENATAL VISIT NOTE  Subjective:  Gloria Kemp is a 25 y.o. G2P1001 at [redacted]w[redacted]d being seen today for ongoing prenatal care.  She is currently monitored for the following issues for this high-risk pregnancy and has Alpha thalassemia silent carrier; Obesity in pregnancy; Chronic benign essential hypertension, antepartum; Supervision of high-risk pregnancy; History of gestational diabetes; History of prior pregnancy with IUGR newborn; Prediabetes; and Child with VSD (ventricular septal defect) on their problem list.  Patient reports no complaints.  Contractions: Not present. Vag. Bleeding: None.  Movement: Increased. Denies leaking of fluid.   The following portions of the patient's history were reviewed and updated as appropriate: allergies, current medications, past family history, past medical history, past social history, past surgical history and problem list.   Objective:   Vitals:   08/08/20 1304 08/08/20 1326  BP: (!) 158/95 (!) 142/90  Pulse: (!) 112   Weight: 209 lb (94.8 kg)     Fetal Status: Fetal Heart Rate (bpm): 144   Movement: Increased     General:  Alert, oriented and cooperative. Patient is in no acute distress.  Skin: Skin is warm and dry. No rash noted.   Cardiovascular: Normal heart rate noted  Respiratory: Normal respiratory effort, no problems with respiration noted  Abdomen: Soft, gravid, appropriate for gestational age.  Pain/Pressure: Present     Pelvic: Cervical exam deferred        Extremities: Normal range of motion.  Edema: None  Mental Status: Normal mood and affect. Normal behavior. Normal judgment and thought content.   Assessment and Plan:  Pregnancy: G2P1001 at [redacted]w[redacted]d 1. Chronic benign essential hypertension, antepartum Denies any symptoms. Procardia increased for better control. Continue ASA. - NIFEdipine (PROCARDIA XL/NIFEDICAL XL) 60 MG 24 hr tablet; Take 1 tablet (60 mg total) by mouth daily.  Dispense: 30 tablet; Refill: 2  2. History of  prior pregnancy with IUGR newborn Will follow up scheduled scan.  3. Child with VSD (ventricular septal defect) Reports child had hole in heart, chart review showed possible VSD. Also given A1C of 6.2, h/o GDM, will order fetal ECHO. - US Fetal Echocardiography; Future  4. History of gestational diabete 5. Prediabetes Initial A1C 6.2, needs early 2 hr GTT. Fetal ECHO ordered as above.  - Glucose Tolerance, 2 Hours w/1 Hour; Future - US Fetal Echocardiography; Future  6. [redacted] weeks gestation of pregnancy 7. Supervision of high risk pregnancy in second trimester No other complaints or concerns.  Routine obstetric precautions reviewed.  Please refer to After Visit Summary for other counseling recommendations.   Return in about 1 week (around 08/15/2020) for Lab draw for 2 hr GTT  4 weeks from now: OFFICE OB VISIT (MD).  Future Appointments  Date Time Provider Department Center  08/15/2020  8:15 AM [facility] None  08/16/2020  2:15 PM [facility]  09/05/2020  8:30 AM Hermina Staggers, MD [facility] None    Jaynie Collins, MD

## 2020-08-15 ENCOUNTER — Other Ambulatory Visit: Payer: Managed Care, Other (non HMO)

## 2020-08-15 ENCOUNTER — Other Ambulatory Visit: Payer: Self-pay

## 2020-08-15 DIAGNOSIS — Z8632 Personal history of gestational diabetes: Secondary | ICD-10-CM

## 2020-08-15 DIAGNOSIS — R7303 Prediabetes: Secondary | ICD-10-CM

## 2020-08-16 ENCOUNTER — Encounter: Payer: Self-pay | Admitting: Obstetrics & Gynecology

## 2020-08-16 ENCOUNTER — Ambulatory Visit: Payer: Managed Care, Other (non HMO) | Attending: Obstetrics and Gynecology

## 2020-08-16 ENCOUNTER — Ambulatory Visit: Payer: Managed Care, Other (non HMO) | Admitting: *Deleted

## 2020-08-16 ENCOUNTER — Encounter: Payer: Self-pay | Admitting: *Deleted

## 2020-08-16 DIAGNOSIS — Z8759 Personal history of other complications of pregnancy, childbirth and the puerperium: Secondary | ICD-10-CM | POA: Diagnosis present

## 2020-08-16 DIAGNOSIS — Z8632 Personal history of gestational diabetes: Secondary | ICD-10-CM

## 2020-08-16 DIAGNOSIS — Z363 Encounter for antenatal screening for malformations: Secondary | ICD-10-CM

## 2020-08-16 DIAGNOSIS — O24419 Gestational diabetes mellitus in pregnancy, unspecified control: Secondary | ICD-10-CM | POA: Insufficient documentation

## 2020-08-16 DIAGNOSIS — O10012 Pre-existing essential hypertension complicating pregnancy, second trimester: Secondary | ICD-10-CM

## 2020-08-16 DIAGNOSIS — Z8279 Family history of other congenital malformations, deformations and chromosomal abnormalities: Secondary | ICD-10-CM | POA: Insufficient documentation

## 2020-08-16 DIAGNOSIS — O09292 Supervision of pregnancy with other poor reproductive or obstetric history, second trimester: Secondary | ICD-10-CM | POA: Diagnosis not present

## 2020-08-16 DIAGNOSIS — Z3A19 19 weeks gestation of pregnancy: Secondary | ICD-10-CM

## 2020-08-16 DIAGNOSIS — O24410 Gestational diabetes mellitus in pregnancy, diet controlled: Secondary | ICD-10-CM

## 2020-08-16 DIAGNOSIS — O10019 Pre-existing essential hypertension complicating pregnancy, unspecified trimester: Secondary | ICD-10-CM | POA: Diagnosis not present

## 2020-08-16 LAB — GLUCOSE TOLERANCE, 2 HOURS W/ 1HR
Glucose, 1 hour: 169 mg/dL (ref 65–179)
Glucose, 2 hour: 160 mg/dL — ABNORMAL HIGH (ref 65–152)
Glucose, Fasting: 100 mg/dL — ABNORMAL HIGH (ref 65–91)

## 2020-08-16 NOTE — Addendum Note (Signed)
Addended by: Jaynie Collins A on: 08/16/2020 09:59 AM   Modules accepted: Orders

## 2020-08-17 ENCOUNTER — Other Ambulatory Visit: Payer: Self-pay | Admitting: Obstetrics

## 2020-08-17 DIAGNOSIS — O10012 Pre-existing essential hypertension complicating pregnancy, second trimester: Secondary | ICD-10-CM

## 2020-08-18 ENCOUNTER — Encounter: Payer: Managed Care, Other (non HMO) | Admitting: Student

## 2020-08-25 ENCOUNTER — Other Ambulatory Visit: Payer: Self-pay | Admitting: *Deleted

## 2020-08-25 DIAGNOSIS — O0992 Supervision of high risk pregnancy, unspecified, second trimester: Secondary | ICD-10-CM

## 2020-08-25 DIAGNOSIS — O24419 Gestational diabetes mellitus in pregnancy, unspecified control: Secondary | ICD-10-CM

## 2020-08-25 MED ORDER — ACCU-CHEK SOFTCLIX LANCETS MISC: 12 refills | Status: DC

## 2020-08-25 MED ORDER — ACCU-CHEK GUIDE VI STRP: ORAL_STRIP | 12 refills | Status: DC

## 2020-08-25 MED ORDER — ACCU-CHEK GUIDE W/DEVICE KIT: PACK | 0 refills | Status: DC

## 2020-08-25 NOTE — Progress Notes (Signed)
Diabetic supplies and N&D referral ordered

## 2020-09-05 ENCOUNTER — Other Ambulatory Visit: Payer: Self-pay

## 2020-09-05 ENCOUNTER — Encounter: Payer: Self-pay | Admitting: Obstetrics and Gynecology

## 2020-09-05 ENCOUNTER — Ambulatory Visit (INDEPENDENT_AMBULATORY_CARE_PROVIDER_SITE_OTHER): Payer: Managed Care, Other (non HMO) | Admitting: Obstetrics and Gynecology

## 2020-09-05 VITALS — BP 134/88 | HR 123 | Wt 207.0 lb

## 2020-09-05 DIAGNOSIS — O10019 Pre-existing essential hypertension complicating pregnancy, unspecified trimester: Secondary | ICD-10-CM

## 2020-09-05 DIAGNOSIS — Z8759 Personal history of other complications of pregnancy, childbirth and the puerperium: Secondary | ICD-10-CM

## 2020-09-05 DIAGNOSIS — O24410 Gestational diabetes mellitus in pregnancy, diet controlled: Secondary | ICD-10-CM

## 2020-09-05 DIAGNOSIS — Z8279 Family history of other congenital malformations, deformations and chromosomal abnormalities: Secondary | ICD-10-CM

## 2020-09-05 DIAGNOSIS — O0990 Supervision of high risk pregnancy, unspecified, unspecified trimester: Secondary | ICD-10-CM

## 2020-09-05 NOTE — Progress Notes (Signed)
Subjective:  Gloria Kemp is a 25 y.o. G2P1001 at [redacted]w[redacted]d being seen today for ongoing prenatal care.  She is currently monitored for the following issues for this high-risk pregnancy and has Alpha thalassemia silent carrier; Obesity in pregnancy; Chronic benign essential hypertension, antepartum; Supervision of high-risk pregnancy; History of gestational diabetes; History of prior pregnancy with IUGR newborn; Prediabetes; Child with VSD (ventricular septal defect); and Diabetes mellitus arising in pregnancy on their problem list.  Patient reports no complaints.  Contractions: Not present. Vag. Bleeding: None.  Movement: Present. Denies leaking of fluid.   The following portions of the patient's history were reviewed and updated as appropriate: allergies, current medications, past family history, past medical history, past social history, past surgical history and problem list. Problem list updated.  Objective:   Vitals:   09/05/20 0839  BP: 134/88  Pulse: (!) 123  Weight: 207 lb (93.9 kg)    Fetal Status: Fetal Heart Rate (bpm): 152   Movement: Present     General:  Alert, oriented and cooperative. Patient is in no acute distress.  Skin: Skin is warm and dry. No rash noted.   Cardiovascular: Normal heart rate noted  Respiratory: Normal respiratory effort, no problems with respiration noted  Abdomen: Soft, gravid, appropriate for gestational age. Pain/Pressure: Absent     Pelvic:  Cervical exam deferred        Extremities: Normal range of motion.  Edema: None  Mental Status: Normal mood and affect. Normal behavior. Normal judgment and thought content.   Urinalysis:      Assessment and Plan:  Pregnancy: G2P1001 at [redacted]w[redacted]d  1. Supervision of high risk pregnancy, antepartum Stable  2. Diet controlled gestational diabetes mellitus (GDM) in second trimester Has not seen DM educator yet. GDM and pregnancy reviewed with pt Importance of glycemic control to reduce risks reviewed CBG  goals reviewed Pt to sign up for Bab[employer]s  3. Child with VSD (ventricular septal defect) Fetal ECHO this week  4. Chronic benign essential hypertension, antepartum BP stable Continue with current management Growth scan next week  5. History of prior pregnancy with IUGR newborn Growth scan next week  Preterm labor symptoms and general obstetric precautions including but not limited to vaginal bleeding, contractions, leaking of fluid and fetal movement were reviewed in detail with the patient. Please refer to After Visit Summary for other counseling recommendations.  No follow-ups on file.   Hermina Staggers, MD

## 2020-09-05 NOTE — Progress Notes (Signed)
[facility], she cannot go to [facility] on Wednesdays because she works and her job won't let her off. She also said that she cannot check her BS 2 hrs after eating because she have problems getting anyone to relief her at work.  C/o back pain 3-8/10 especially when she stands or walk around.

## 2020-09-05 NOTE — Patient Instructions (Signed)

## 2020-09-06 ENCOUNTER — Telehealth: Payer: Self-pay | Admitting: *Deleted

## 2020-09-06 NOTE — Telephone Encounter (Signed)
Attempt to contact pt to discuss FMLA forms. No answer, no VM.

## 2020-09-12 ENCOUNTER — Ambulatory Visit: Payer: Managed Care, Other (non HMO) | Attending: Obstetrics

## 2020-09-12 ENCOUNTER — Other Ambulatory Visit: Payer: Self-pay | Admitting: *Deleted

## 2020-09-12 ENCOUNTER — Encounter: Payer: Self-pay | Admitting: *Deleted

## 2020-09-12 ENCOUNTER — Ambulatory Visit: Payer: Managed Care, Other (non HMO) | Admitting: *Deleted

## 2020-09-12 ENCOUNTER — Other Ambulatory Visit: Payer: Self-pay

## 2020-09-12 VITALS — BP 138/91 | HR 115

## 2020-09-12 DIAGNOSIS — O09292 Supervision of pregnancy with other poor reproductive or obstetric history, second trimester: Secondary | ICD-10-CM

## 2020-09-12 DIAGNOSIS — O24410 Gestational diabetes mellitus in pregnancy, diet controlled: Secondary | ICD-10-CM | POA: Diagnosis not present

## 2020-09-12 DIAGNOSIS — O10912 Unspecified pre-existing hypertension complicating pregnancy, second trimester: Secondary | ICD-10-CM

## 2020-09-12 DIAGNOSIS — O10012 Pre-existing essential hypertension complicating pregnancy, second trimester: Secondary | ICD-10-CM

## 2020-09-12 DIAGNOSIS — Z8279 Family history of other congenital malformations, deformations and chromosomal abnormalities: Secondary | ICD-10-CM | POA: Diagnosis present

## 2020-09-12 DIAGNOSIS — Z3A23 23 weeks gestation of pregnancy: Secondary | ICD-10-CM | POA: Diagnosis not present

## 2020-09-12 DIAGNOSIS — O283 Abnormal ultrasonic finding on antenatal screening of mother: Secondary | ICD-10-CM

## 2020-09-12 DIAGNOSIS — Z8632 Personal history of gestational diabetes: Secondary | ICD-10-CM

## 2020-09-19 ENCOUNTER — Other Ambulatory Visit: Payer: Self-pay

## 2020-09-19 ENCOUNTER — Encounter: Payer: Self-pay | Admitting: Dietician

## 2020-09-19 ENCOUNTER — Ambulatory Visit (INDEPENDENT_AMBULATORY_CARE_PROVIDER_SITE_OTHER): Payer: Managed Care, Other (non HMO) | Admitting: Obstetrics and Gynecology

## 2020-09-19 ENCOUNTER — Encounter: Payer: Managed Care, Other (non HMO) | Attending: Obstetrics & Gynecology | Admitting: Dietician

## 2020-09-19 VITALS — BP 138/92 | HR 106 | Wt 214.8 lb

## 2020-09-19 DIAGNOSIS — Z8279 Family history of other congenital malformations, deformations and chromosomal abnormalities: Secondary | ICD-10-CM

## 2020-09-19 DIAGNOSIS — O10019 Pre-existing essential hypertension complicating pregnancy, unspecified trimester: Secondary | ICD-10-CM

## 2020-09-19 DIAGNOSIS — D563 Thalassemia minor: Secondary | ICD-10-CM

## 2020-09-19 DIAGNOSIS — Z3A24 24 weeks gestation of pregnancy: Secondary | ICD-10-CM

## 2020-09-19 DIAGNOSIS — O24410 Gestational diabetes mellitus in pregnancy, diet controlled: Secondary | ICD-10-CM

## 2020-09-19 DIAGNOSIS — O24419 Gestational diabetes mellitus in pregnancy, unspecified control: Secondary | ICD-10-CM

## 2020-09-19 DIAGNOSIS — Z8632 Personal history of gestational diabetes: Secondary | ICD-10-CM

## 2020-09-19 DIAGNOSIS — O0992 Supervision of high risk pregnancy, unspecified, second trimester: Secondary | ICD-10-CM

## 2020-09-19 DIAGNOSIS — Z8759 Personal history of other complications of pregnancy, childbirth and the puerperium: Secondary | ICD-10-CM

## 2020-09-19 MED ORDER — LABETALOL HCL 200 MG PO TABS: 200.0000 mg | ORAL_TABLET | Freq: Two times a day (BID) | ORAL | 3 refills | Status: DC

## 2020-09-19 MED ORDER — ACCU-CHEK GUIDE VI STRP: ORAL_STRIP | 12 refills | Status: DC

## 2020-09-19 NOTE — Progress Notes (Signed)
ROB 24 wks Reports continued low back pain. Has not purchased belly support band yet. Reports still having some trouble getting to check CBG at work. FMLA forms should be at front office for patient to pick up today. Patient informed.

## 2020-09-19 NOTE — Progress Notes (Signed)
PRENATAL VISIT NOTE  Subjective:  Gloria Kemp is a 25 y.o. G2P1001 at [redacted]w[redacted]d being seen today for ongoing prenatal care.  She is currently monitored for the following issues for this high-risk pregnancy and has Alpha thalassemia silent carrier; Obesity in pregnancy; Chronic benign essential hypertension, antepartum; Supervision of high-risk pregnancy; History of gestational diabetes; History of prior pregnancy with IUGR newborn; Prediabetes; Child with VSD (ventricular septal defect); Diabetes mellitus arising in pregnancy; and [redacted] weeks gestation of pregnancy on their problem list.  Patient doing well with no acute concerns today. She reports no complaints.  Contractions: Not present. Vag. Bleeding: None.  Movement: Present. Denies leaking of fluid.   Per pt she has not been taking her blood sugars at all since she cannot take them at work.  FMLA papers given so patient can take her blood sugars.  Currently she is diet controlled.  Discussed adding a second blood pressure medication and her upcoming repeat ultrasound.  The following portions of the patient's history were reviewed and updated as appropriate: allergies, current medications, past family history, past medical history, past social history, past surgical history and problem list. Problem list updated.  Objective:   Vitals:   09/19/20 0817  BP: (!) 138/92  Pulse: (!) 106  Weight: 214 lb 12.8 oz (97.4 kg)    Fetal Status: Fetal Heart Rate (bpm): 151 Fundal Height: 24 cm Movement: Present     General:  Alert, oriented and cooperative. Patient is in no acute distress.  Skin: Skin is warm and dry. No rash noted.   Cardiovascular: Normal heart rate noted  Respiratory: Normal respiratory effort, no problems with respiration noted  Abdomen: Soft, gravid, appropriate for gestational age.  Pain/Pressure: Absent     Pelvic: Cervical exam deferred        Extremities: Normal range of motion.  Edema: Trace  Mental Status:  Normal mood  and affect. Normal behavior. Normal judgment and thought content.   Assessment and Plan:  Pregnancy: G2P1001 at [redacted]w[redacted]d  1. Chronic benign essential hypertension, antepartum Continue to monitor BP, testing at 32 weeks - labetalol (NORMODYNE) 200 MG tablet; Take 1 tablet (200 mg total) by mouth 2 (two) times daily.  Dispense: 60 tablet; Refill: 3  2. [redacted] weeks gestation of pregnancy   3. Supervision of high risk pregnancy in second trimester Third trimester labs next visit - glucose blood (ACCU-CHEK GUIDE) test strip; Use 1 test strip to check blood glucose 4 times daily  Dispense: 100 each; Refill: 12  4. History of prior pregnancy with IUGR newborn   5. History of gestational diabetes Currently GDM  6. Child with VSD (ventricular septal defect)   7. Alpha thalassemia silent carrier   8. Diet controlled gestational diabetes mellitus (GDM) in second trimester Pt again advised to take her blood sugars, will look into freestyle libre for easier access - glucose blood (ACCU-CHEK GUIDE) test strip; Use 1 test strip to check blood glucose 4 times daily  Dispense: 100 each; Refill: 12  9. Gestational diabetes mellitus (GDM) in second trimester, gestational diabetes method of control unspecified  - glucose blood (ACCU-CHEK GUIDE) test strip; Use 1 test strip to check blood glucose 4 times daily  Dispense: 100 each; Refill: 12  Preterm labor symptoms and general obstetric precautions including but not limited to vaginal bleeding, contractions, leaking of fluid and fetal movement were reviewed in detail with the patient.  Please refer to After Visit Summary for other counseling recommendations.   Return in about  3 weeks (around 10/10/2020) for [facility], in person, 3rd trim labs.   Mariel Aloe, MD Faculty Attending Center for [facility]

## 2020-09-19 NOTE — Progress Notes (Signed)
Patient is here today alone.  She would like a refressure regarding GDM. Previous history of GDM, prediabetes. She is [redacted] weeks gestation.  210 lbs, 62"  24 hour recall: Breakfast:  usually sticks but was told to start eating this Snack:  none Lunch:  chicken, mac and cheese OR salad OR frozen meal Snack:  chips Dinner:  pasta, chicken OR chicken salad, crackers OR [place] [employer]ak sub OR meat and vegetables Snack:  none Beverages:  water, flavored water, regular soda occasionally or zero soda to help with heartburn, vitamin water Exercise:  works at [employer] full time and increased walking on the job  Patient was seen as a one on one appointment for Gestational Diabetes self-management at the [facility]. The following learning objectives were met by the patient during this course:  States the definition of Gestational Diabetes States why dietary management is important in controlling blood glucose Describes the effects each nutrient has on blood glucose levels Demonstrates ability to create a balanced meal plan Demonstrates carbohydrate counting  States when to check blood glucose levels Demonstrates proper blood glucose monitoring techniques States the effect of stress and exercise on blood glucose levels States the importance of limiting caffeine and abstaining from alcohol and smoking  Blood glucose monitor:  Patient has a meter from a previous pregnancy.  She has been checking her blood sugar inconsistency.  She states that she is currently out of strips but will pick some up today.  She works at [employer] 6 am-3 pm and does not have consistent breaks or lunch. She states that fasting blood glucose has been about 80 recently.  Patient instructed to monitor glucose levels: FBS: 60 - <90 1 hour: <140 2 hour: <120  *Patient received handouts: Nutrition Diabetes and Pregnancy Carbohydrate Counting List Snack list  Patient will be seen for  follow-up as needed.  

## 2020-09-28 ENCOUNTER — Ambulatory Visit: Payer: Managed Care, Other (non HMO)

## 2020-10-10 ENCOUNTER — Ambulatory Visit: Payer: Managed Care, Other (non HMO) | Attending: Obstetrics and Gynecology

## 2020-10-10 ENCOUNTER — Ambulatory Visit (HOSPITAL_BASED_OUTPATIENT_CLINIC_OR_DEPARTMENT_OTHER): Payer: Managed Care, Other (non HMO) | Admitting: Obstetrics and Gynecology

## 2020-10-10 ENCOUNTER — Encounter: Payer: Self-pay | Admitting: *Deleted

## 2020-10-10 ENCOUNTER — Other Ambulatory Visit: Payer: Self-pay | Admitting: *Deleted

## 2020-10-10 ENCOUNTER — Other Ambulatory Visit: Payer: Self-pay

## 2020-10-10 ENCOUNTER — Other Ambulatory Visit: Payer: Self-pay | Admitting: Obstetrics and Gynecology

## 2020-10-10 ENCOUNTER — Ambulatory Visit: Payer: Managed Care, Other (non HMO) | Admitting: *Deleted

## 2020-10-10 ENCOUNTER — Encounter: Payer: Managed Care, Other (non HMO) | Admitting: Obstetrics and Gynecology

## 2020-10-10 VITALS — BP 143/82 | HR 118

## 2020-10-10 DIAGNOSIS — O289 Unspecified abnormal findings on antenatal screening of mother: Secondary | ICD-10-CM

## 2020-10-10 DIAGNOSIS — O10912 Unspecified pre-existing hypertension complicating pregnancy, second trimester: Secondary | ICD-10-CM

## 2020-10-10 DIAGNOSIS — O10012 Pre-existing essential hypertension complicating pregnancy, second trimester: Secondary | ICD-10-CM

## 2020-10-10 DIAGNOSIS — O09292 Supervision of pregnancy with other poor reproductive or obstetric history, second trimester: Secondary | ICD-10-CM

## 2020-10-10 DIAGNOSIS — O24410 Gestational diabetes mellitus in pregnancy, diet controlled: Secondary | ICD-10-CM

## 2020-10-10 DIAGNOSIS — Z3A27 27 weeks gestation of pregnancy: Secondary | ICD-10-CM

## 2020-10-10 DIAGNOSIS — O365920 Maternal care for other known or suspected poor fetal growth, second trimester, not applicable or unspecified: Secondary | ICD-10-CM

## 2020-10-10 DIAGNOSIS — O365990 Maternal care for other known or suspected poor fetal growth, unspecified trimester, not applicable or unspecified: Secondary | ICD-10-CM

## 2020-10-10 DIAGNOSIS — Z8279 Family history of other congenital malformations, deformations and chromosomal abnormalities: Secondary | ICD-10-CM | POA: Diagnosis present

## 2020-10-10 NOTE — Progress Notes (Signed)
Maternal-Fetal Medicine   Name: Gloria Kemp DOB: 07-08-1995 MRN: [id number] Referring Provider: Jaynie Collins, MD  I had the pleasure of seeing [facility] at [facility]. She returned for fetal growth assessment.  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 Patient is chronic hypertension and takes labetalol and nifedipine.  Blood pressures today at her office were 143/82 134/70 mmHg.  Patient has gestational diabetes that is reportedly well controlled on diet. Obstetrical history significant for a term vaginal delivery in 2021 of a female infant.  Her child had ventricular septal defect with spontaneous closure.  She had fetal echocardiography on 09/05/2020 at [facility] and it was reported as normal.  She has a follow-up fetal echocardiography appointment next month. Ultrasound The estimated fetal weight is at the 6th percentile.  Abdominal circumference measurement is at the 16th percentile.  Umbilical artery Doppler showed normal forward diastolic flow.  Amniotic fluid is normal and good fetal activity seen.  The four-chamber view and cardiac axis appears normal. In the left lung, two small cysts measuring 5 x 5 x4  and 4 x 4 millimeters are seen. Color-Doppler showed no increase in vascularity. Right lung appears normal. No pleural effusion or hydrops is seen.  Our concerns include Fetal growth restriction -I explained the finding of fetal growth restriction.  Most likely causes placental insufficiency from chronic hypertension.  Her previous pregnancy was also complicated by fetal growth restriction. -I explained our ultrasound protocol for monitoring fetal growth restriction.  Weekly antenatal testing will be recommended from her next visit.  -Timing of delivery will be based on interval fetal growth and antenatal testing.  Congenital pulmonary airway malformation (CPAM) -I explained that the findings are  consistent with CPAM being the more likely diagnosis. -Most often it is unilateral and can be present in either lung.  It is cystic and adenomatous elements and the lower lobe is more commonly affected.  -Some cases can be complicated by fetal hydrops.  -CPAM can also resolve with advancing gestation or after birth.  -I have reassured her that in the absence of hydrops, it is associated with good fetal and neonatal outcomes.  Postnatal imaging studies will be performed.  Recommendations -An appointment was made for her to return in 2 weeks for UA Doppler studies in 3 weeks for fetal growth assessment. -Weekly BPP from 31 to [redacted] weeks gestation till delivery. -Continue nifedipine and labetalol. -Postnatal imaging studies.  Thank you for consultation.  If you have any questions or concerns, please contact me the [facility].  Consultation including face-to-face (more than 50%) counseling 30 minutes.

## 2020-10-11 ENCOUNTER — Ambulatory Visit (INDEPENDENT_AMBULATORY_CARE_PROVIDER_SITE_OTHER): Payer: Managed Care, Other (non HMO) | Admitting: Nurse Practitioner

## 2020-10-11 VITALS — BP 145/96 | HR 113 | Wt 210.4 lb

## 2020-10-11 DIAGNOSIS — O10019 Pre-existing essential hypertension complicating pregnancy, unspecified trimester: Secondary | ICD-10-CM

## 2020-10-11 DIAGNOSIS — Z3A27 27 weeks gestation of pregnancy: Secondary | ICD-10-CM

## 2020-10-11 DIAGNOSIS — Z8759 Personal history of other complications of pregnancy, childbirth and the puerperium: Secondary | ICD-10-CM

## 2020-10-11 DIAGNOSIS — O24419 Gestational diabetes mellitus in pregnancy, unspecified control: Secondary | ICD-10-CM

## 2020-10-11 DIAGNOSIS — O0992 Supervision of high risk pregnancy, unspecified, second trimester: Secondary | ICD-10-CM

## 2020-10-11 NOTE — Progress Notes (Signed)
Subjective:  Gloria Kemp is a 25 y.o. G2P1001 at [redacted]w[redacted]d being seen today for ongoing prenatal care.  She is currently monitored for the following issues for this high-risk pregnancy and has Alpha thalassemia silent carrier; Obesity in pregnancy; Chronic benign essential hypertension, antepartum; Supervision of high-risk pregnancy; History of gestational diabetes; History of prior pregnancy with IUGR newborn; Prediabetes; Child with VSD (ventricular septal defect); and Diabetes mellitus arising in pregnancy on their problem list.  Patient reports no complaints.  Contractions: Irritability. Vag. Bleeding: None.  Movement: Present. Denies leaking of fluid.   The following portions of the patient's history were reviewed and updated as appropriate: allergies, current medications, past family history, past medical history, past social history, past surgical history and problem list. Problem list updated.  Objective:   Vitals:   10/11/20 1427  BP: (!) 145/96  Pulse: (!) 113  Weight: 210 lb 6.4 oz (95.4 kg)    Fetal Status: Fetal Heart Rate (bpm): 135   Movement: Present     General:  Alert, oriented and cooperative. Patient is in no acute distress.  Skin: Skin is warm and dry. No rash noted.   Cardiovascular: Normal heart rate noted  Respiratory: Normal respiratory effort, no problems with respiration noted  Abdomen: Soft, gravid, appropriate for gestational age. Pain/Pressure: Present     Pelvic:  Cervical exam deferred        Extremities: Normal range of motion.  Edema: Trace  Mental Status: Normal mood and affect. Normal behavior. Normal judgment and thought content.   Urinalysis:      Assessment and Plan:  Pregnancy: G2P1001 at [redacted]w[redacted]d  1. Supervision of high risk pregnancy in second trimester Very pleasant and talkative. Did not take BP meds today - took them yesterday while at [facility].  Forgot to take them before leaving home today. Also was unsure when to take meds - reviewed  with Dr. Donavan Foil - Prcardia XL once a day and Labatelol BID.   Client has both meds and will try to take them.  Is at work from 6 am -3 pm, so takes first dose at 5 am.  Encouraged to take her meds before work daily and OK to take Labatelol and Procardia XL at the same time - notify the office if she is having any dizziness or lightheadedness. Is very stressed - work is requiring her to stand while at work - she is in Office manager at [employer].  Is commited to working until maternity leave as she will have a paid maternity leave. Her one year old son is having surgery next week for circumcision as he has had an infection and had to go to the hospital. Does not want more children.  Would like a BTL if it is possible.  Advised to discuss with MD at next visit. Fundal measurement is S>D likely due to habitus - had [place] yesterday showing suspicious to progress to IUGR  2. Chronic benign essential hypertension, antepartum See above  3. Gestational diabetes mellitus (GDM) in second trimester, gestational diabetes method of control unspecified Does not keep written record on paper but has readings listed on her phone.   All fasting readings in the last week were 80 or under All post meal readings in the last week were under 100 - she reports sometimes they might be slightly over 2 hours as she is at work and it is hard to get a break right at 2 hours to check.  4. History of prior pregnancy with IUGR  newborn Also current pregnancy is being monitored by MFM for CPAM and suspected IUGR - dopplers scheduled weekly at 29 weeks  Preterm labor symptoms and general obstetric precautions including but not limited to vaginal bleeding, contractions, leaking of fluid and fetal movement were reviewed in detail with the patient. Please refer to After Visit Summary for other counseling recommendations.  Return in about 1 week (around 10/18/2020) for RN visit for BP check and in 2 weeks for MD [facility] visit.  Nolene Bernheim, RN,  MSN, NP-BC Nurse Practitioner, [facility] for [employer], [facility] 10/11/2020 3:00 PM

## 2020-10-17 ENCOUNTER — Ambulatory Visit: Payer: Managed Care, Other (non HMO)

## 2020-10-25 ENCOUNTER — Encounter: Payer: Self-pay | Admitting: *Deleted

## 2020-10-25 ENCOUNTER — Ambulatory Visit: Payer: Managed Care, Other (non HMO) | Admitting: *Deleted

## 2020-10-25 ENCOUNTER — Other Ambulatory Visit: Payer: Self-pay

## 2020-10-25 ENCOUNTER — Other Ambulatory Visit: Payer: Self-pay | Admitting: *Deleted

## 2020-10-25 ENCOUNTER — Other Ambulatory Visit: Payer: Self-pay | Admitting: Obstetrics and Gynecology

## 2020-10-25 ENCOUNTER — Ambulatory Visit: Payer: Managed Care, Other (non HMO) | Attending: Obstetrics and Gynecology

## 2020-10-25 VITALS — BP 144/78 | HR 107

## 2020-10-25 DIAGNOSIS — O289 Unspecified abnormal findings on antenatal screening of mother: Secondary | ICD-10-CM | POA: Diagnosis not present

## 2020-10-25 DIAGNOSIS — Z3A29 29 weeks gestation of pregnancy: Secondary | ICD-10-CM

## 2020-10-25 DIAGNOSIS — O365990 Maternal care for other known or suspected poor fetal growth, unspecified trimester, not applicable or unspecified: Secondary | ICD-10-CM | POA: Insufficient documentation

## 2020-10-25 DIAGNOSIS — O10013 Pre-existing essential hypertension complicating pregnancy, third trimester: Secondary | ICD-10-CM

## 2020-10-25 DIAGNOSIS — O365930 Maternal care for other known or suspected poor fetal growth, third trimester, not applicable or unspecified: Secondary | ICD-10-CM | POA: Diagnosis not present

## 2020-10-25 DIAGNOSIS — Z362 Encounter for other antenatal screening follow-up: Secondary | ICD-10-CM | POA: Diagnosis not present

## 2020-10-25 DIAGNOSIS — Z8279 Family history of other congenital malformations, deformations and chromosomal abnormalities: Secondary | ICD-10-CM

## 2020-10-25 DIAGNOSIS — O352XX0 Maternal care for (suspected) hereditary disease in fetus, not applicable or unspecified: Secondary | ICD-10-CM

## 2020-10-25 DIAGNOSIS — O24410 Gestational diabetes mellitus in pregnancy, diet controlled: Secondary | ICD-10-CM

## 2020-10-31 ENCOUNTER — Ambulatory Visit (INDEPENDENT_AMBULATORY_CARE_PROVIDER_SITE_OTHER): Payer: Managed Care, Other (non HMO) | Admitting: Advanced Practice Midwife

## 2020-10-31 ENCOUNTER — Other Ambulatory Visit: Payer: Self-pay

## 2020-10-31 VITALS — BP 134/90 | HR 122 | Wt 206.0 lb

## 2020-10-31 DIAGNOSIS — O10913 Unspecified pre-existing hypertension complicating pregnancy, third trimester: Secondary | ICD-10-CM

## 2020-10-31 DIAGNOSIS — Z8279 Family history of other congenital malformations, deformations and chromosomal abnormalities: Secondary | ICD-10-CM

## 2020-10-31 DIAGNOSIS — O0990 Supervision of high risk pregnancy, unspecified, unspecified trimester: Secondary | ICD-10-CM

## 2020-10-31 DIAGNOSIS — O24419 Gestational diabetes mellitus in pregnancy, unspecified control: Secondary | ICD-10-CM

## 2020-10-31 DIAGNOSIS — Z3A30 30 weeks gestation of pregnancy: Secondary | ICD-10-CM

## 2020-10-31 MED ORDER — LABETALOL HCL 300 MG PO TABS: 300.0000 mg | ORAL_TABLET | Freq: Two times a day (BID) | ORAL | 2 refills | Status: DC

## 2020-10-31 NOTE — Progress Notes (Signed)
ROB 30 wks GDM, has log on phone CHTN on meds

## 2020-10-31 NOTE — Progress Notes (Signed)
   PRENATAL VISIT NOTE  Subjective:  Gloria Kemp is a 25 y.o. G2P1001 at [redacted]w[redacted]d being seen today for ongoing prenatal care.  She is currently monitored for the following issues for this high-risk pregnancy and has Alpha thalassemia silent carrier; Obesity in pregnancy; Chronic benign essential hypertension, antepartum; Supervision of high-risk pregnancy; History of gestational diabetes; History of prior pregnancy with IUGR newborn; Prediabetes; Child with VSD (ventricular septal defect); and Diabetes mellitus arising in pregnancy on their problem list.  Patient reports no complaints.  Contractions: Irritability. Vag. Bleeding: None.  Movement: Present. Denies leaking of fluid.   The following portions of the patient's history were reviewed and updated as appropriate: allergies, current medications, past family history, past medical history, past social history, past surgical history and problem list.   Objective:   Vitals:   10/31/20 1106  BP: 134/90  Pulse: (!) 122  Weight: 206 lb (93.4 kg)    Fetal Status: Fetal Heart Rate (bpm): 132   Movement: Present     General:  Alert, oriented and cooperative. Patient is in no acute distress.  Skin: Skin is warm and dry. No rash noted.   Cardiovascular: Normal heart rate noted  Respiratory: Normal respiratory effort, no problems with respiration noted  Abdomen: Soft, gravid, appropriate for gestational age.  Pain/Pressure: Present     Pelvic: Cervical exam deferred        Extremities: Normal range of motion.  Edema: Trace  Mental Status: Normal mood and affect. Normal behavior. Normal judgment and thought content.   Assessment and Plan:  Pregnancy: G2P1001 at [redacted]w[redacted]d 1. Supervision of high risk pregnancy, antepartum --Anticipatory guidance about next visits/weeks of pregnancy given. --Next visit in 2 weeks  2. Family history of VSD (ventricular septal defect)   3. Gestational diabetes mellitus (GDM) in second trimester, gestational  diabetes method of control unspecified --Reviewed glucose log --All fasting glucose below 80, all PP below 120 --continue current course with diet  4. Chronic hypertension complicating or reason for care during pregnancy, third trimester --On  Prodardia 30 XL and labetalol 200 mg BID --Increase labetalol to 300 BID with borderline BPs, pt reports 130s/80s at home --No s/sx of PEC  5. [redacted] weeks gestation of pregnancy   Preterm labor symptoms and general obstetric precautions including but not limited to vaginal bleeding, contractions, leaking of fluid and fetal movement were reviewed in detail with the patient. Please refer to After Visit Summary for other counseling recommendations.   No follow-ups on file.  Future Appointments  Date Time Provider Department Center  11/01/2020  1:30 PM [facility] NURSE [facility]  11/01/2020  1:45 PM [facility] US4 [facility]  11/01/2020  3:15 PM [facility] NST [facility]  11/07/2020  9:45 AM [facility] NURSE [facility]  11/07/2020 10:00 AM [facility] US1 [facility]    Sharen Counter, CNM

## 2020-11-01 ENCOUNTER — Encounter: Payer: Self-pay | Admitting: *Deleted

## 2020-11-01 ENCOUNTER — Ambulatory Visit: Payer: Managed Care, Other (non HMO) | Admitting: *Deleted

## 2020-11-01 ENCOUNTER — Ambulatory Visit (HOSPITAL_BASED_OUTPATIENT_CLINIC_OR_DEPARTMENT_OTHER): Payer: Managed Care, Other (non HMO) | Admitting: *Deleted

## 2020-11-01 ENCOUNTER — Ambulatory Visit: Payer: Managed Care, Other (non HMO) | Attending: Obstetrics and Gynecology

## 2020-11-01 ENCOUNTER — Other Ambulatory Visit: Payer: Self-pay | Admitting: *Deleted

## 2020-11-01 VITALS — BP 140/74 | HR 108

## 2020-11-01 DIAGNOSIS — O24410 Gestational diabetes mellitus in pregnancy, diet controlled: Secondary | ICD-10-CM | POA: Diagnosis not present

## 2020-11-01 DIAGNOSIS — Q330 Congenital cystic lung: Secondary | ICD-10-CM | POA: Insufficient documentation

## 2020-11-01 DIAGNOSIS — O289 Unspecified abnormal findings on antenatal screening of mother: Secondary | ICD-10-CM | POA: Diagnosis not present

## 2020-11-01 DIAGNOSIS — O352XX0 Maternal care for (suspected) hereditary disease in fetus, not applicable or unspecified: Secondary | ICD-10-CM

## 2020-11-01 DIAGNOSIS — O0993 Supervision of high risk pregnancy, unspecified, third trimester: Secondary | ICD-10-CM

## 2020-11-01 DIAGNOSIS — O24414 Gestational diabetes mellitus in pregnancy, insulin controlled: Secondary | ICD-10-CM | POA: Diagnosis present

## 2020-11-01 DIAGNOSIS — O365930 Maternal care for other known or suspected poor fetal growth, third trimester, not applicable or unspecified: Secondary | ICD-10-CM

## 2020-11-01 DIAGNOSIS — O365990 Maternal care for other known or suspected poor fetal growth, unspecified trimester, not applicable or unspecified: Secondary | ICD-10-CM | POA: Insufficient documentation

## 2020-11-01 DIAGNOSIS — O10013 Pre-existing essential hypertension complicating pregnancy, third trimester: Secondary | ICD-10-CM

## 2020-11-01 DIAGNOSIS — Z8279 Family history of other congenital malformations, deformations and chromosomal abnormalities: Secondary | ICD-10-CM

## 2020-11-01 DIAGNOSIS — Z8632 Personal history of gestational diabetes: Secondary | ICD-10-CM

## 2020-11-01 DIAGNOSIS — Z3A30 30 weeks gestation of pregnancy: Secondary | ICD-10-CM | POA: Diagnosis present

## 2020-11-01 DIAGNOSIS — O10913 Unspecified pre-existing hypertension complicating pregnancy, third trimester: Secondary | ICD-10-CM

## 2020-11-01 DIAGNOSIS — O365931 Maternal care for other known or suspected poor fetal growth, third trimester, fetus 1: Secondary | ICD-10-CM

## 2020-11-01 NOTE — Procedures (Signed)
Gloria Kemp 10-07-1995 [redacted]w[redacted]d  Fetus A Non-Stress Test Interpretation for 11/01/20  Indication: IUGR  Fetal Heart Rate A Mode: External Baseline Rate (A): 140 bpm Variability: Moderate Accelerations: 10 x 10 Decelerations: Variable Multiple birth?: No  Uterine Activity Mode: Palpation, Toco Contraction Frequency (min): none Resting Tone Palpated: Relaxed Resting Time: Adequate  Interpretation (Fetal Testing) Nonstress Test Interpretation: Reactive Overall Impression: Reassuring for gestational age Comments: Dr. Parke Poisson reviewed tracing

## 2020-11-07 ENCOUNTER — Ambulatory Visit: Payer: Managed Care, Other (non HMO)

## 2020-11-07 ENCOUNTER — Ambulatory Visit: Payer: Managed Care, Other (non HMO) | Attending: Obstetrics and Gynecology

## 2020-11-07 ENCOUNTER — Other Ambulatory Visit: Payer: Self-pay | Admitting: Obstetrics & Gynecology

## 2020-11-07 DIAGNOSIS — O10019 Pre-existing essential hypertension complicating pregnancy, unspecified trimester: Secondary | ICD-10-CM

## 2020-11-14 ENCOUNTER — Encounter: Payer: Managed Care, Other (non HMO) | Admitting: Obstetrics and Gynecology

## 2020-11-16 ENCOUNTER — Ambulatory Visit: Payer: Managed Care, Other (non HMO) | Attending: Obstetrics and Gynecology

## 2020-11-16 ENCOUNTER — Other Ambulatory Visit: Payer: Self-pay

## 2020-11-16 ENCOUNTER — Ambulatory Visit: Payer: Managed Care, Other (non HMO) | Admitting: *Deleted

## 2020-11-16 ENCOUNTER — Encounter: Payer: Self-pay | Admitting: *Deleted

## 2020-11-16 VITALS — BP 121/69 | HR 99

## 2020-11-16 DIAGNOSIS — Z8279 Family history of other congenital malformations, deformations and chromosomal abnormalities: Secondary | ICD-10-CM | POA: Diagnosis present

## 2020-11-16 DIAGNOSIS — O0993 Supervision of high risk pregnancy, unspecified, third trimester: Secondary | ICD-10-CM | POA: Insufficient documentation

## 2020-11-16 DIAGNOSIS — O365931 Maternal care for other known or suspected poor fetal growth, third trimester, fetus 1: Secondary | ICD-10-CM | POA: Insufficient documentation

## 2020-11-22 ENCOUNTER — Ambulatory Visit: Payer: Managed Care, Other (non HMO) | Attending: Maternal & Fetal Medicine | Admitting: Maternal & Fetal Medicine

## 2020-11-22 ENCOUNTER — Ambulatory Visit: Payer: Managed Care, Other (non HMO) | Attending: Obstetrics

## 2020-11-22 ENCOUNTER — Encounter: Payer: Self-pay | Admitting: Obstetrics and Gynecology

## 2020-11-22 ENCOUNTER — Encounter: Payer: Self-pay | Admitting: *Deleted

## 2020-11-22 ENCOUNTER — Other Ambulatory Visit: Payer: Self-pay

## 2020-11-22 ENCOUNTER — Ambulatory Visit: Payer: Managed Care, Other (non HMO) | Admitting: *Deleted

## 2020-11-22 ENCOUNTER — Ambulatory Visit (INDEPENDENT_AMBULATORY_CARE_PROVIDER_SITE_OTHER): Payer: Managed Care, Other (non HMO) | Admitting: Obstetrics and Gynecology

## 2020-11-22 ENCOUNTER — Other Ambulatory Visit: Payer: Self-pay | Admitting: *Deleted

## 2020-11-22 VITALS — BP 120/72 | HR 95

## 2020-11-22 VITALS — BP 133/77 | HR 95 | Wt 206.0 lb

## 2020-11-22 DIAGNOSIS — O365931 Maternal care for other known or suspected poor fetal growth, third trimester, fetus 1: Secondary | ICD-10-CM | POA: Diagnosis present

## 2020-11-22 DIAGNOSIS — O24410 Gestational diabetes mellitus in pregnancy, diet controlled: Secondary | ICD-10-CM

## 2020-11-22 DIAGNOSIS — O10013 Pre-existing essential hypertension complicating pregnancy, third trimester: Secondary | ICD-10-CM | POA: Diagnosis not present

## 2020-11-22 DIAGNOSIS — O0993 Supervision of high risk pregnancy, unspecified, third trimester: Secondary | ICD-10-CM

## 2020-11-22 DIAGNOSIS — O10019 Pre-existing essential hypertension complicating pregnancy, unspecified trimester: Secondary | ICD-10-CM

## 2020-11-22 DIAGNOSIS — O365930 Maternal care for other known or suspected poor fetal growth, third trimester, not applicable or unspecified: Secondary | ICD-10-CM | POA: Diagnosis not present

## 2020-11-22 DIAGNOSIS — Z364 Encounter for antenatal screening for fetal growth retardation: Secondary | ICD-10-CM

## 2020-11-22 DIAGNOSIS — O09293 Supervision of pregnancy with other poor reproductive or obstetric history, third trimester: Secondary | ICD-10-CM | POA: Diagnosis not present

## 2020-11-22 DIAGNOSIS — O352XX0 Maternal care for (suspected) hereditary disease in fetus, not applicable or unspecified: Secondary | ICD-10-CM

## 2020-11-22 DIAGNOSIS — Z8279 Family history of other congenital malformations, deformations and chromosomal abnormalities: Secondary | ICD-10-CM | POA: Insufficient documentation

## 2020-11-22 DIAGNOSIS — Q330 Congenital cystic lung: Secondary | ICD-10-CM

## 2020-11-22 DIAGNOSIS — O99210 Obesity complicating pregnancy, unspecified trimester: Secondary | ICD-10-CM

## 2020-11-22 DIAGNOSIS — O365910 Maternal care for other known or suspected poor fetal growth, first trimester, not applicable or unspecified: Secondary | ICD-10-CM

## 2020-11-22 DIAGNOSIS — Z3A33 33 weeks gestation of pregnancy: Secondary | ICD-10-CM

## 2020-11-22 MED ORDER — NIFEDIPINE ER OSMOTIC RELEASE 60 MG PO TB24: 60.0000 mg | ORAL_TABLET | Freq: Every day | ORAL | 2 refills | Status: DC

## 2020-11-22 NOTE — Patient Instructions (Signed)
AREA PEDIATRIC/FAMILY PRACTICE PHYSICIANS  Central/[place]outheast [place] ([address]) [facility] Chambliss, MD; Eniola, MD; Hale, MD; Hensel, MD; McDiarmid, MD; McIntyer, MD; Neal, MD; Walden, MD [address] [place], [place] [address] [phone] Mon-Fri 8:30-12:30, 1:30-5:00 Providers come to see babies at [facility] Accepting Medicaid [facility] providers who accept newborns: Koirala, MD; Morrow, MD; Wolters, MD [address], [place], [place] [address] [phone] Mon-Fri 8:00-5:30 Babies seen by providers at [facility] Does NOT accept Medicaid Please call early in hospitalization for appointment (limited availability)  [facility] Mulberry, MD [address] [place], [place] [address] [phone] Mon, Tue, Thur, Fri 8:30-5:00, Wed 10:00-7:00 (closed 1-2pm) Babies seen by [facility] providers Accepting Medicaid Rubin - Pediatrician Rubin, MD [address], [place], [place] [address] [phone] Mon-Fri 8:30-5:00, Sat 8:30-12:00 Provider comes to see babies at [facility] Accepting Medicaid Must have been referred from current patients or contacted office prior to delivery [facility] ([facility]) Brown, MD; Chandler, MD; Ettefagh, MD; Grant, MD; Lester, MD; McCormick, MD; McQueen, MD; Prose, MD; Simha, MD; Stanley, MD; Stryffeler, NP; Tebben, NP [address], [place], [place] [address] [phone] Mon, Tue, Thur, Fri 8:30-5:30, Wed 9:30-5:30, Sat 8:30-12:30 Babies seen by [facility] providers Accepting Medicaid Only accepting infants of first-time parents or siblings of current patients Hospital discharge coordinator will make follow-up appointment Jack Amos [address], [place], [place]  [address] [phone]   Fax - [phone] [facility] [address], [place], [place]  [address] Phone - [phone]   Fax - [phone] Shilpa Gosrani [address], [place], [place]  [address] [phone]  East/[place]ortheast [place] ([address]) [facility] Bates, MD; Brassfield, MD; Cooper, Cox, MD; MD; Davis, MD; Dovico, MD; Ettefaugh, MD; Little, MD; Lowe, MD; Keiffer, MD; Melvin, MD; Sumner, MD; Williams, MD [address], [place], [place] [address] [phone] Mon-Fri 8:30-5:00 (extended evenings Mon-Thur as needed), Sat-Sun 10:00-1:00 Providers come to see babies at [facility] Accepting Medicaid for families of first-time babies and families with all children in the household age 3 and under. Must register with office prior to making appointment (M-F only). [facility] Henson, NP; Knapp, MD; Lalonde, MD; Tysinger, PA [address] [place], [place] [address] [phone] Mon-Fri 8:00-5:00 Babies seen by providers at [facility] Does NOT accept Medicaid/Commercial Insurance Only [facility] ([facility])  Artis, MD; Barnes, MD; Bratton, MD; Coccaro, MD; Lockett Gardner, MD; Kramer, MD; Marshall, MD; Netherton, MD; Poleto, MD; Skinner, MD [address] [place], [place] [address] [phone] Mon-Fri 8:30-5:30, Sat (Oct.-Mar.) 9:00-1:00 Babies seen by providers at [facility] Accepting Medicaid  [place] ([address]) [facility] Reid, MD; Warner, MD [address], [place], [place] [address] [phone] Mon-Fri 8:30-5:00, Sat 8:30-12:00 Providers come to see babies at [facility] Does NOT accept Medicaid [facility] Becker, PA; Hagler, MD; Scifres, PA; Sun, MD; Swayne, MD [address], [place],  [address] [phone] Mon-Fri 8:00-5:00 Babies seen by providers at [facility] Does NOT accept Medicaid Only accepting babies of parents who  are patients Please call early in hospitalization for appointment (limited availability) [facility] Clark, MD; Frye, MD; Kelleher, MD; Mack, NP; Miller, MD; O'Keller, MD; Patterson, NP; Pudlo, MD; Puzio, MD; Thomas, MD; Tucker, MD; Twiselton, MD [address], [place], [place] [address] [phone] Mon-Fri 8:00-5:00, Sat 9:00-12:00 Providers come to see babies at [facility] Does NOT accept Medicaid  [place] ([address]) [facility] providers accepting new patients: Brake, NP; Wharton, PA [address], [place], [place] [address] [phone] Mon-Fri 8:00-5:00 Babies seen by providers at [facility] Does NOT accept Medicaid Only accepting babies of parents who are patients Please call early in hospitalization for appointment (limited availability) [facility] Gay, MD; Quinlan, MD [address] [place], [place] [address] [phone] (press [phone] to schedule appointment) Mon-Fri 8:00-5:00 Providers come to see babies at [facility] Does NOT accept Medicaid [facility] Mazer, MD [address] [place], [place] [address] [phone] Mon-Fri 8:30-5:00 (lunch 12:30-1:00), extended hours by appointment only Wed 5:00-6:30 Babies seen by [facility] providers Accepting Medicaid [facility] Kloc, MD; Jordan, MD; Koberlein, MD [address], [place], [place] [address] [phone] Mon-Fri 8:00-5:00 Babies seen by [facility] providers Does NOT accept Medicaid [facility] Parker, MD; Hunter, MD; Wallace, DO [address] [place], [place] [address] [phone] Mon-Fri 8:00-5:00 Babies seen by [facility] providers Does NOT accept Medicaid [facility] Brandon, PA; Brecken, PA; Christy, NP; Dees, MD; DeClaire, MD; DeWeese, MD; Hansen, NP; Mills, NP; Parrish, NP; Smoot, NP; Summer, MD; Vapne,  MD [address] [place], [place] [address] [phone] Mon-Fri 8:30-5:00, Sat 10:00-1:00 Providers come to see babies at [facility] Does NOT accept Medicaid Free prenatal information session Tuesdays at 4:45pm [facility] Bouska, MD; Gordon, PA; Jeffery, PA; Weber, PA [address] [place] [address] [phone] Mon-Fri 7:30-5:30 Babies seen by [facility] providers [facility] [address], [place], [place]  [address] [phone]   Fax - [phone]  [place] ([address] & [address]) [facility] [place] [address] [place], [place] [address] [phone] Mon-Thur 8:00-6:00 Providers come to see babies at [facility] Accepting Medicaid [facility] Anderson, NP; Badger, MD; Beal, PA; Spencer, PA [address] [place], [place] [address] [phone] Mon-Thur 7:30-7:30, Fri 7:30-4:30 Babies seen by [facility] providers Accepting Medicaid [facility] Agbuya, MD; Klett, NP; Romgoolam, MD [address], [place], [place] [address] [phone] Mon-Fri 8:30-5:00, Sat 8:30-12:00 Providers come to see babies at [facility] Accepting Medicaid Must have "Meet & Greet" appointment at office prior to delivery [facility] ([facility]) McCord, MD; Wallace, MD; Wood, MD [address], [place], [place] [address] [phone] Mon-Wed 8:00-6:00, Thur-Fri 8:00-5:00, Sat 9:00-12:00 Providers come to see babies at [facility] Does NOT accept Medicaid Only accepting siblings of current patients [facility]  [address], [place], [place]  [address] [phone]   Fax - [phone] [facility] [address], [place], [place]  [address] [phone]   Fax -  [phone]  [place] ([address] & [address]) [facility] Cirigliano, DO; Matthews, DO [address] [place], [place] [address] [phone] Mon-Fri 7:00-5:00 Babies seen by [facility] providers Does NOT accept Medicaid [facility] Briscoe, MD; Howley, PA; Moreira, PA [address], [place], [place] [address] [phone] Mon-Fri 8:00-5:00 Babies seen by [facility] providers Accepting Medicaid [facility] Boyd, MD; Church, PA; Jones, NP; Osborn, PA [address], ,  [address] [phone] Mon-Fri 8:00-5:00 Babies seen by providers at [facility] Accepting Medicaid  [place] ([address]) [facility] [place] Wendling, DO [address] [place], [place] [address] [phone] Mon-Fri 8:00-5:00 Babies seen by [facility] providers Does NOT accept Medicaid Limited availability, please call early in hospitalization to schedule follow-up [facility] Calderon, PA; Cummings, MD; Dillard, MD; Martin, PA; Olson, MD; VanDeven, PA [address], [place], [place] [address] [phone] Mon-Fri 8:30-5:00, Sat 9:00-12:00 Babies seen by providers at [facility] Accepting Medicaid Please register online then schedule online or call office [email or web address] ([facility]) Hunter, NP; Kumar, MD; Martin Rogers, PA [address], [place], [place] [address] [phone] Mon-Fri 8:00-5:00 Babies seen by providers at [facility] Accepting Medicaid [facility] ([facility]) Avilla, MD; Kristi Fleenor, NP; West, MD [address], [place], [place] [address] [phone] Mon-Fri 8:00-5:30, Sat&Sun by appointment (phones open at  8:30) Babies seen by [facility] providers Accepting Medicaid Must be a first-time baby or sibling of current patient [facility] [place]  [address], [place], [place]  [address] [phone]   Fax - [phone]  [place] ([address]) [facility] Brown, PA; Cowen, PA; Rice, MD; Helton, PA; Spry, MD [address] [place], [place] [address] [phone] Mon-Thur 8:00-7:00, Fri 8:00-5:00, Sat 8:00-12:00, Sun 9:00-12:00 Babies seen by [facility] providers Accepting Medicaid [facility] Coe-Goins, MD; Marshall, MD; Pierre-Louis, MD [address] [place] [address], [place], [place] [address] [phone] Mon-Thur 8:00-5:00 Babies seen by providers at [facility] Accepting Medicaid [facility] Bratton, MD; Coe-Goins, MD; Hayes, MD; Lewis, MD; List, MD; Lott, MD; Marshall, MD; Moran, MD; O'Neal, MD; Pierre-Louis, MD; Pitonzo, MD; Scholer, MD; Spangle, MD [address] [place], [place] [address] [phone] Mon-Fri 8:00-5:30, Sat (Oct.-Mar.) 9:00-1:00 Babies seen by providers at [facility] Accepting Medicaid Must fill out new patient packet, available online at [email or web address] [facility] ([facility]) Friddle, NP; Harris, NP; Kelly, NP; Logan, MD; Melvin, PA; Poth, MD; Ramadoss, MD; Stanton, NP [address], [place], [place] [address] [phone] Mon-Thur 8:00-5:30, Fri 8:00-5:00 Babies seen by providers at [facility] Accepting Medicaid  [place] ([address]) [facility] Dixon, PA; [place], MD; Pickard, MD; Tapia, PA [address], [place], [place] [address] [phone] Mon-Fri 8:00-5:00 Babies seen by providers at [facility] Accepting Medicaid   [place] ([address]) [facility] [place] Masneri, DO; Meyers, MD; Nelson, PA [address], [place], [place] [address] [phone] Mon-Fri 8:00-5:00 Babies seen by providers at [facility] Does NOT accept Medicaid Limited appointment availability, please call early in hospitalization  [facility] [place] Kunedd, DO; McGowen, MD [address], [place], [place] [address] [phone] Mon-Fri 8:00-5:00 Babies seen by [facility] providers Does NOT accept Medicaid [facility] [place] Cameron, MD; MacDonald, MD; Michaels, PA; Nayak, MD [address] [place] [address], [place], [place] [address] [phone] Mon-Fri 8:00-5:00 After hours clinic ([address] [place], [place] [address]) [phone] Mon-Fri 5:00-8:00, Sat 12:00-6:00, Sun 10:00-4:00 Babies seen by [facility] providers Accepting Medicaid [facility] [place] [address], [place], [place]  [address] [phone]   Fax - [phone]  [place] ([address]) [facility] [place] [facility] Andy, MD [address], [place], [place] [address] [phone] Mon-Fri 8:00-5:00 Babies seen by [facility] providers Does NOT accept Medicaid [facility] [place] ([facility] [place]) Eksir, MD [address], [place], [place] [address] [phone] Mon-Thur 8:00-7:00, Fri 8:00-5:00, Sat 8:00-12:00 Babies seen by providers at [facility] Accepting Medicaid - but does not have vaccinations in office (must be received elsewhere) Limited availability, please call early in hospitalization  [place] ([address]) [facility]  Charlene Flemming, MD [address], [place] [address] [phone]  Fax [phone]  [facility]  Kimberly Newton, MD, Annamarie Streilein, PA, Carla Hampton, PA [address] [place], [place]  [address] [phone] [facility]  [address], [place], [place] [address] [phone] [address], [place], [place] [address] [phone] (West Office)  [facility] [address], [place], [place] [address] [phone] [facility] [address] [place], [place], [place] [address] [phone] [facility] [address], [place], [place] [address] [phone] [facility] [address], [place], [place] [address] [phone] [facility] [address], [place], [place] [address] [phone] [facility] [address], [place], [place] [address] [phone] [facility]  [address], [place], [place] [address] [phone] [facility] [address] [place] [address], [place], [place] [address] [phone] [facility] [address], [place], [place] [address] [phone] [facility] [address], [place], [place] [address] [phone]  

## 2020-11-22 NOTE — Progress Notes (Signed)
   PRENATAL VISIT NOTE  Subjective:  Gloria Kemp is a 25 y.o. G2P1001 at [redacted]w[redacted]d being seen today for ongoing prenatal care.  She is currently monitored for the following issues for this high-risk pregnancy and has Alpha thalassemia silent carrier; Obesity in pregnancy; Chronic benign essential hypertension, antepartum; IUGR (intrauterine growth restriction) affecting care of mother; Supervision of high-risk pregnancy; History of gestational diabetes; History of prior pregnancy with IUGR newborn; Prediabetes; Child with VSD (ventricular septal defect); and Diabetes mellitus arising in pregnancy on their problem list.  Patient reports no complaints.  Contractions: Irritability. Vag. Bleeding: None.  Movement: Present. Denies leaking of fluid.   The following portions of the patient's history were reviewed and updated as appropriate: allergies, current medications, past family history, past medical history, past social history, past surgical history and problem list.   Objective:   Vitals:   11/22/20 1325  BP: 133/77  Pulse: 95  Weight: 206 lb (93.4 kg)    Fetal Status: Fetal Heart Rate (bpm): 143 Fundal Height: 31 cm Movement: Present     General:  Alert, oriented and cooperative. Patient is in no acute distress.  Skin: Skin is warm and dry. No rash noted.   Cardiovascular: Normal heart rate noted  Respiratory: Normal respiratory effort, no problems with respiration noted  Abdomen: Soft, gravid, appropriate for gestational age.  Pain/Pressure: Present     Pelvic: Cervical exam deferred        Extremities: Normal range of motion.  Edema: Trace  Mental Status: Normal mood and affect. Normal behavior. Normal judgment and thought content.   Assessment and Plan:  Pregnancy: G2P1001 at [redacted]w[redacted]d 1. Supervision of high risk pregnancy in third trimester Patient is doing well without complaints Cultures next visit Patient desires BTL- form signed today Patient interested in new pediatrician-  list provided  2. Chronic benign essential hypertension, antepartum Stable on procardia and labetalol Continue ASA  3. Diet controlled gestational diabetes mellitus (GDM) in third trimester CBGS reviewed and all within range Congratulated patient on her efforts Continue diet control  4. Obesity in pregnancy   5. Poor fetal growth affecting management of mother in first trimester, single or unspecified fetus Continue weekly testing CPAM stable in size Discussed delivery at 37 weeks. Patient desires to delay to 38 weeks if fetal status remains stable  Preterm labor symptoms and general obstetric precautions including but not limited to vaginal bleeding, contractions, leaking of fluid and fetal movement were reviewed in detail with the patient. Please refer to After Visit Summary for other counseling recommendations.   Return in about 2 weeks (around 12/06/2020) for in person, ROB, High risk.  Future Appointments  Date Time Provider Department Center  11/22/2020  2:30 PM [facility] NURSE [facility]  11/22/2020  2:45 PM [facility] US5 [facility]  11/30/2020 12:45 PM [facility] NURSE [facility]  11/30/2020  1:00 PM [facility] US1 [facility]    Catalina Antigua, MD

## 2020-11-22 NOTE — Progress Notes (Signed)
+   Fetal movement. Pt had blood sugar log on phone to review. Pt needs RF on nifedipine.

## 2020-11-23 NOTE — Progress Notes (Signed)
MFM Brief Note  Gloria Kemp is a a 25 yo G2P1 who is here for FGR previously at the 6th %.   Normal interval growth with measurements consistent with EFW 10th% an improvement in overall growth. Good fetal movement and amniotic fluid volume  The UA Dopplers are normal without evidence of AEDF or REDF. Biophysical profile 8/8  Ms. Edling continues to take Labetalol and Nifedipine to manage her chronic hypertension. Her blood pressure was normal 120/72 mmHg.  She also reports good blood sugars as well.  At this time we recommend continued weekly testing and repeat growth in 3-4 weeks, we will determine timing of delivery. However, based on trends she should delivery between 38-39 weeks.   I spent 20 minutes with > 50% in face to face consultation.  Novella Olive, MD.

## 2020-11-30 ENCOUNTER — Ambulatory Visit: Payer: Managed Care, Other (non HMO)

## 2020-12-05 ENCOUNTER — Other Ambulatory Visit: Payer: Self-pay

## 2020-12-05 ENCOUNTER — Ambulatory Visit: Payer: Managed Care, Other (non HMO) | Admitting: Obstetrics and Gynecology

## 2020-12-05 ENCOUNTER — Other Ambulatory Visit (HOSPITAL_COMMUNITY)
Admission: RE | Admit: 2020-12-05 | Discharge: 2020-12-05 | Disposition: A | Discharge status: Home / Self Care | Payer: Managed Care, Other (non HMO) | Source: Ambulatory Visit | Attending: Obstetrics and Gynecology | Admitting: Obstetrics and Gynecology

## 2020-12-05 ENCOUNTER — Encounter: Payer: Self-pay | Admitting: Obstetrics and Gynecology

## 2020-12-05 VITALS — BP 123/75 | HR 111 | Wt 207.0 lb

## 2020-12-05 DIAGNOSIS — O10019 Pre-existing essential hypertension complicating pregnancy, unspecified trimester: Secondary | ICD-10-CM

## 2020-12-05 DIAGNOSIS — O0993 Supervision of high risk pregnancy, unspecified, third trimester: Secondary | ICD-10-CM | POA: Diagnosis not present

## 2020-12-05 DIAGNOSIS — O24410 Gestational diabetes mellitus in pregnancy, diet controlled: Secondary | ICD-10-CM

## 2020-12-05 DIAGNOSIS — Z3A36 36 weeks gestation of pregnancy: Secondary | ICD-10-CM

## 2020-12-05 DIAGNOSIS — O365930 Maternal care for other known or suspected poor fetal growth, third trimester, not applicable or unspecified: Secondary | ICD-10-CM

## 2020-12-05 LAB — OB RESULTS CONSOLE GC/CHLAMYDIA: Gonorrhea: NEGATIVE

## 2020-12-05 NOTE — Progress Notes (Signed)
   PRENATAL VISIT NOTE  Subjective:  Gloria Kemp is a 25 y.o. G2P1001 at 100w0d being seen today for ongoing prenatal care.  She is currently monitored for the following issues for this high-risk pregnancy and has Alpha thalassemia silent carrier; Obesity in pregnancy; Chronic benign essential hypertension, antepartum; IUGR (intrauterine growth restriction) affecting care of mother; Supervision of high-risk pregnancy; History of gestational diabetes; History of prior pregnancy with IUGR newborn; Prediabetes; Child with VSD (ventricular septal defect); and Diabetes mellitus arising in pregnancy on their problem list.  Patient reports no complaints.  Contractions: Irritability. Vag. Bleeding: None.  Movement: Present. Denies leaking of fluid.   The following portions of the patient's history were reviewed and updated as appropriate: allergies, current medications, past family history, past medical history, past social history, past surgical history and problem list.   Objective:   Vitals:   12/05/20 1505  BP: 123/75  Pulse: (!) 111  Weight: 207 lb (93.9 kg)    Fetal Status: Fetal Heart Rate (bpm): 152 Fundal Height: 33 cm Movement: Present     General:  Alert, oriented and cooperative. Patient is in no acute distress.  Skin: Skin is warm and dry. No rash noted.   Cardiovascular: Normal heart rate noted  Respiratory: Normal respiratory effort, no problems with respiration noted  Abdomen: Soft, gravid, appropriate for gestational age.  Pain/Pressure: Present     Pelvic: Cervical exam performed in the presence of a chaperone Dilation: Fingertip Effacement (%): Thick Station: Ballotable  Extremities: Normal range of motion.  Edema: Trace  Mental Status: Normal mood and affect. Normal behavior. Normal judgment and thought content.   Assessment and Plan:  Pregnancy: G2P1001 at [redacted]w[redacted]d 1. Supervision of high risk pregnancy in third trimester Patient is doing well without complaints CUltures  collected Patient undecided on pediatrician - Strep Gp B NAA - Cervicovaginal ancillary only( [place])  2. Poor fetal growth affecting management of mother in third trimester, single or unspecified fetus Follow up per MFM Delivery by 38-39 weeks if remains stable  3. Diet controlled gestational diabetes mellitus (GDM) in third trimester CBGs reviewed and all within range Continue diet control  4. Chronic benign essential hypertension, antepartum Stable without medication Continue ASA  5. [redacted] weeks gestation of pregnancy  - Strep Gp B NAA - Cervicovaginal ancillary only( [place])  Preterm labor symptoms and general obstetric precautions including but not limited to vaginal bleeding, contractions, leaking of fluid and fetal movement were reviewed in detail with the patient. Please refer to After Visit Summary for other counseling recommendations.   Return in about 1 week (around 12/12/2020) for in person, ROB, High risk.  Future Appointments  Date Time Provider Department Center  12/08/2020  3:30 PM [facility] NURSE [facility]  12/08/2020  3:45 PM [facility] US5 [facility]  12/15/2020  2:45 PM [facility] NURSE [facility]  12/15/2020  3:00 PM [facility] US1 [facility]  12/19/2020  8:55 AM Salam Micucci, MD [facility] None  12/20/2020  2:30 PM [facility] NURSE [facility]  12/20/2020  2:45 PM [facility] US5 [facility]  12/27/2020  8:30 AM [facility] NURSE [facility]  12/27/2020  8:45 AM [facility] US4 [facility]    Saron Tweed, MD

## 2020-12-05 NOTE — Progress Notes (Signed)
+   Fetal movement. No complaints. Pt has glucose logs for review.  

## 2020-12-06 LAB — CERVICOVAGINAL ANCILLARY ONLY
Chlamydia: NEGATIVE
Comment: NEGATIVE
Comment: NORMAL
Neisseria Gonorrhea: NEGATIVE

## 2020-12-07 ENCOUNTER — Encounter: Payer: Self-pay | Admitting: Obstetrics and Gynecology

## 2020-12-07 DIAGNOSIS — O99820 Streptococcus B carrier state complicating pregnancy: Secondary | ICD-10-CM | POA: Insufficient documentation

## 2020-12-07 LAB — STREP GP B NAA: Strep Gp B NAA: POSITIVE — AB

## 2020-12-08 ENCOUNTER — Encounter: Payer: Self-pay | Admitting: *Deleted

## 2020-12-08 ENCOUNTER — Other Ambulatory Visit: Payer: Self-pay | Admitting: Maternal & Fetal Medicine

## 2020-12-08 ENCOUNTER — Ambulatory Visit: Payer: Managed Care, Other (non HMO) | Attending: Maternal & Fetal Medicine

## 2020-12-08 ENCOUNTER — Other Ambulatory Visit: Payer: Self-pay

## 2020-12-08 ENCOUNTER — Ambulatory Visit: Payer: Managed Care, Other (non HMO) | Admitting: *Deleted

## 2020-12-08 VITALS — BP 138/80 | HR 96

## 2020-12-08 DIAGNOSIS — Q330 Congenital cystic lung: Secondary | ICD-10-CM

## 2020-12-08 DIAGNOSIS — O99820 Streptococcus B carrier state complicating pregnancy: Secondary | ICD-10-CM | POA: Insufficient documentation

## 2020-12-08 DIAGNOSIS — O24410 Gestational diabetes mellitus in pregnancy, diet controlled: Secondary | ICD-10-CM | POA: Diagnosis not present

## 2020-12-08 DIAGNOSIS — O365930 Maternal care for other known or suspected poor fetal growth, third trimester, not applicable or unspecified: Secondary | ICD-10-CM

## 2020-12-08 DIAGNOSIS — O10013 Pre-existing essential hypertension complicating pregnancy, third trimester: Secondary | ICD-10-CM | POA: Diagnosis not present

## 2020-12-08 DIAGNOSIS — O0993 Supervision of high risk pregnancy, unspecified, third trimester: Secondary | ICD-10-CM | POA: Diagnosis present

## 2020-12-08 DIAGNOSIS — Z8279 Family history of other congenital malformations, deformations and chromosomal abnormalities: Secondary | ICD-10-CM | POA: Insufficient documentation

## 2020-12-08 DIAGNOSIS — O09293 Supervision of pregnancy with other poor reproductive or obstetric history, third trimester: Secondary | ICD-10-CM | POA: Diagnosis not present

## 2020-12-08 DIAGNOSIS — O352XX0 Maternal care for (suspected) hereditary disease in fetus, not applicable or unspecified: Secondary | ICD-10-CM

## 2020-12-08 DIAGNOSIS — Z3A35 35 weeks gestation of pregnancy: Secondary | ICD-10-CM

## 2020-12-15 ENCOUNTER — Ambulatory Visit: Payer: Managed Care, Other (non HMO) | Admitting: *Deleted

## 2020-12-15 ENCOUNTER — Ambulatory Visit: Payer: Managed Care, Other (non HMO) | Attending: Maternal & Fetal Medicine

## 2020-12-15 ENCOUNTER — Other Ambulatory Visit: Payer: Self-pay

## 2020-12-15 ENCOUNTER — Encounter: Payer: Self-pay | Admitting: *Deleted

## 2020-12-15 VITALS — BP 143/80 | HR 94

## 2020-12-15 DIAGNOSIS — O10013 Pre-existing essential hypertension complicating pregnancy, third trimester: Secondary | ICD-10-CM

## 2020-12-15 DIAGNOSIS — Z8279 Family history of other congenital malformations, deformations and chromosomal abnormalities: Secondary | ICD-10-CM

## 2020-12-15 DIAGNOSIS — Q330 Congenital cystic lung: Secondary | ICD-10-CM | POA: Diagnosis not present

## 2020-12-15 DIAGNOSIS — O352XX0 Maternal care for (suspected) hereditary disease in fetus, not applicable or unspecified: Secondary | ICD-10-CM | POA: Diagnosis not present

## 2020-12-15 DIAGNOSIS — O24410 Gestational diabetes mellitus in pregnancy, diet controlled: Secondary | ICD-10-CM

## 2020-12-15 DIAGNOSIS — O365930 Maternal care for other known or suspected poor fetal growth, third trimester, not applicable or unspecified: Secondary | ICD-10-CM | POA: Diagnosis not present

## 2020-12-15 DIAGNOSIS — O365990 Maternal care for other known or suspected poor fetal growth, unspecified trimester, not applicable or unspecified: Secondary | ICD-10-CM | POA: Insufficient documentation

## 2020-12-15 DIAGNOSIS — O09293 Supervision of pregnancy with other poor reproductive or obstetric history, third trimester: Secondary | ICD-10-CM

## 2020-12-15 DIAGNOSIS — O99820 Streptococcus B carrier state complicating pregnancy: Secondary | ICD-10-CM | POA: Insufficient documentation

## 2020-12-15 DIAGNOSIS — Z3A36 36 weeks gestation of pregnancy: Secondary | ICD-10-CM

## 2020-12-15 DIAGNOSIS — O0993 Supervision of high risk pregnancy, unspecified, third trimester: Secondary | ICD-10-CM

## 2020-12-15 NOTE — Procedures (Signed)
Kmya Holle 1996-03-10 [redacted]w[redacted]d  Fetus A Non-Stress Test Interpretation for 12/15/20  Indication: IUGR, Chronic Hypertenstion, and Gestational Diabetes medication controlled  Fetal Heart Rate A Mode: External Baseline Rate (A): 140 bpm Variability: Moderate Accelerations: 15 x 15 Decelerations: None Multiple birth?: No  Uterine Activity Mode: Palpation, Toco Contraction Frequency (min): None Resting Tone Palpated: Relaxed Resting Time: Adequate  Interpretation (Fetal Testing) Nonstress Test Interpretation: Reactive Comments: Dr. Parke Poisson reviewed tracing.

## 2020-12-16 ENCOUNTER — Other Ambulatory Visit: Payer: Self-pay | Admitting: Maternal & Fetal Medicine

## 2020-12-16 DIAGNOSIS — Q330 Congenital cystic lung: Secondary | ICD-10-CM

## 2020-12-19 ENCOUNTER — Other Ambulatory Visit: Payer: Self-pay

## 2020-12-19 ENCOUNTER — Encounter: Payer: Self-pay | Admitting: Obstetrics and Gynecology

## 2020-12-19 ENCOUNTER — Ambulatory Visit (INDEPENDENT_AMBULATORY_CARE_PROVIDER_SITE_OTHER): Payer: Managed Care, Other (non HMO) | Admitting: Obstetrics and Gynecology

## 2020-12-19 VITALS — BP 134/88 | HR 98 | Wt 205.8 lb

## 2020-12-19 DIAGNOSIS — O24410 Gestational diabetes mellitus in pregnancy, diet controlled: Secondary | ICD-10-CM

## 2020-12-19 DIAGNOSIS — O99820 Streptococcus B carrier state complicating pregnancy: Secondary | ICD-10-CM

## 2020-12-19 DIAGNOSIS — O99210 Obesity complicating pregnancy, unspecified trimester: Secondary | ICD-10-CM

## 2020-12-19 DIAGNOSIS — O365930 Maternal care for other known or suspected poor fetal growth, third trimester, not applicable or unspecified: Secondary | ICD-10-CM

## 2020-12-19 DIAGNOSIS — O0993 Supervision of high risk pregnancy, unspecified, third trimester: Secondary | ICD-10-CM

## 2020-12-19 DIAGNOSIS — O10019 Pre-existing essential hypertension complicating pregnancy, unspecified trimester: Secondary | ICD-10-CM

## 2020-12-19 NOTE — Progress Notes (Signed)
   PRENATAL VISIT NOTE  Subjective:  Gloria Kemp is a 26 y.o. G2P1001 at [redacted]w[redacted]d being seen today for ongoing prenatal care.  She is currently monitored for the following issues for this high-risk pregnancy and has Alpha thalassemia silent carrier; Obesity in pregnancy; Chronic benign essential hypertension, antepartum; IUGR (intrauterine growth restriction) affecting care of mother; Supervision of high-risk pregnancy; History of gestational diabetes; History of prior pregnancy with IUGR newborn; Prediabetes; Child with VSD (ventricular septal defect); Diabetes mellitus arising in pregnancy; and GBS (group B Streptococcus carrier), +RV culture, currently pregnant on their problem list.  Patient reports no complaints.  Contractions: Irritability. Vag. Bleeding: None.  Movement: Present. Denies leaking of fluid.   The following portions of the patient's history were reviewed and updated as appropriate: allergies, current medications, past family history, past medical history, past social history, past surgical history and problem list.   Objective:   Vitals:   12/19/20 0848  BP: 134/88  Pulse: 98  Weight: 205 lb 12.8 oz (93.4 kg)    Fetal Status:     Movement: Present     General:  Alert, oriented and cooperative. Patient is in no acute distress.  Skin: Skin is warm and dry. No rash noted.   Cardiovascular: Normal heart rate noted  Respiratory: Normal respiratory effort, no problems with respiration noted  Abdomen: Soft, gravid, appropriate for gestational age.  Pain/Pressure: Absent     Pelvic: Cervical exam deferred        Extremities: Normal range of motion.  Edema: Trace  Mental Status: Normal mood and affect. Normal behavior. Normal judgment and thought content.   Assessment and Plan:  Pregnancy: G2P1001 at [redacted]w[redacted]d 1. Supervision of high risk pregnancy in third trimester Patient is doing well without complaints   2. Diet controlled gestational diabetes mellitus (GDM) in third  trimester CBGs reviewed and all within range on diet control  3. Chronic benign essential hypertension, antepartum Stable Continue labetalol, procardia and ASA Follow up ultrasound tomorrow Will plan for IOL at 39 weeks  4. Obesity in pregnancy Continue ASA  5. Poor fetal growth affecting management of mother in third trimester, single or unspecified fetus Resolved on previous scan  6. GBS (group B Streptococcus carrier), +RV culture, currently pregnant Prophylaxis in labor  Preterm labor symptoms and general obstetric precautions including but not limited to vaginal bleeding, contractions, leaking of fluid and fetal movement were reviewed in detail with the patient. Please refer to After Visit Summary for other counseling recommendations.   No follow-ups on file.  Future Appointments  Date Time Provider Department Center  12/20/2020  2:30 PM [facility] NURSE [facility]  12/20/2020  2:45 PM [facility] US5 [facility]  12/27/2020  8:30 AM [facility] NURSE [facility]  12/27/2020  8:45 AM [facility] US4 [facility]    Catalina Antigua, MD

## 2020-12-20 ENCOUNTER — Ambulatory Visit: Payer: Managed Care, Other (non HMO) | Attending: Maternal & Fetal Medicine

## 2020-12-20 ENCOUNTER — Encounter: Payer: Self-pay | Admitting: *Deleted

## 2020-12-20 ENCOUNTER — Ambulatory Visit: Payer: Managed Care, Other (non HMO) | Admitting: *Deleted

## 2020-12-20 VITALS — BP 124/64 | HR 93

## 2020-12-20 DIAGNOSIS — Z8279 Family history of other congenital malformations, deformations and chromosomal abnormalities: Secondary | ICD-10-CM | POA: Insufficient documentation

## 2020-12-20 DIAGNOSIS — O0993 Supervision of high risk pregnancy, unspecified, third trimester: Secondary | ICD-10-CM | POA: Diagnosis present

## 2020-12-20 DIAGNOSIS — Q330 Congenital cystic lung: Secondary | ICD-10-CM | POA: Insufficient documentation

## 2020-12-20 DIAGNOSIS — O24410 Gestational diabetes mellitus in pregnancy, diet controlled: Secondary | ICD-10-CM

## 2020-12-20 DIAGNOSIS — Z3A37 37 weeks gestation of pregnancy: Secondary | ICD-10-CM | POA: Diagnosis not present

## 2020-12-20 DIAGNOSIS — O99820 Streptococcus B carrier state complicating pregnancy: Secondary | ICD-10-CM | POA: Diagnosis present

## 2020-12-20 DIAGNOSIS — O10013 Pre-existing essential hypertension complicating pregnancy, third trimester: Secondary | ICD-10-CM | POA: Diagnosis not present

## 2020-12-20 DIAGNOSIS — O09293 Supervision of pregnancy with other poor reproductive or obstetric history, third trimester: Secondary | ICD-10-CM | POA: Diagnosis not present

## 2020-12-24 ENCOUNTER — Encounter: Payer: Self-pay | Admitting: Radiology

## 2020-12-24 ENCOUNTER — Other Ambulatory Visit: Payer: Self-pay | Admitting: Advanced Practice Midwife

## 2020-12-26 ENCOUNTER — Telehealth (HOSPITAL_COMMUNITY): Payer: Self-pay | Admitting: *Deleted

## 2020-12-26 ENCOUNTER — Encounter (HOSPITAL_COMMUNITY): Payer: Self-pay | Admitting: *Deleted

## 2020-12-26 ENCOUNTER — Encounter (HOSPITAL_COMMUNITY): Payer: Self-pay

## 2020-12-26 NOTE — Telephone Encounter (Signed)
Preadmission screen  

## 2020-12-27 ENCOUNTER — Ambulatory Visit: Payer: Managed Care, Other (non HMO)

## 2020-12-27 ENCOUNTER — Other Ambulatory Visit: Payer: Self-pay

## 2020-12-27 ENCOUNTER — Encounter: Payer: Self-pay | Admitting: Family Medicine

## 2020-12-27 ENCOUNTER — Ambulatory Visit (INDEPENDENT_AMBULATORY_CARE_PROVIDER_SITE_OTHER): Payer: Managed Care, Other (non HMO) | Admitting: Family Medicine

## 2020-12-27 VITALS — BP 138/91 | HR 91 | Wt 201.0 lb

## 2020-12-27 DIAGNOSIS — O365930 Maternal care for other known or suspected poor fetal growth, third trimester, not applicable or unspecified: Secondary | ICD-10-CM

## 2020-12-27 DIAGNOSIS — O24410 Gestational diabetes mellitus in pregnancy, diet controlled: Secondary | ICD-10-CM

## 2020-12-27 DIAGNOSIS — O0993 Supervision of high risk pregnancy, unspecified, third trimester: Secondary | ICD-10-CM

## 2020-12-27 DIAGNOSIS — Z3A38 38 weeks gestation of pregnancy: Secondary | ICD-10-CM

## 2020-12-27 DIAGNOSIS — Q330 Congenital cystic lung: Secondary | ICD-10-CM

## 2020-12-27 DIAGNOSIS — O99820 Streptococcus B carrier state complicating pregnancy: Secondary | ICD-10-CM

## 2020-12-27 DIAGNOSIS — O10019 Pre-existing essential hypertension complicating pregnancy, unspecified trimester: Secondary | ICD-10-CM

## 2020-12-27 NOTE — Progress Notes (Signed)
   PRENATAL VISIT NOTE  Subjective:  Gloria Kemp is a 25 y.o. G2P1001 at [redacted]w[redacted]d being seen today for ongoing prenatal care.  She is currently monitored for the following issues for this high-risk pregnancy and has Alpha thalassemia silent carrier; Obesity in pregnancy; Chronic benign essential hypertension, antepartum; IUGR (intrauterine growth restriction) affecting care of mother; Supervision of high-risk pregnancy; History of gestational diabetes; History of prior pregnancy with IUGR newborn; Prediabetes; Child with VSD (ventricular septal defect); Diabetes mellitus arising in pregnancy; and GBS (group B Streptococcus carrier), +RV culture, currently pregnant on their problem list.  Patient reports no complaints.  She is looking forward to her upcoming induction on 10/10. Contractions: Irregular. Vag. Bleeding: None.  Movement: Present. Denies leaking of fluid.   The following portions of the patient's history were reviewed and updated as appropriate: allergies, current medications, past family history, past medical history, past social history, past surgical history and problem list.   Objective:   Vitals:   12/27/20 1336  BP: (!) 138/91  Pulse: 91  Weight: 201 lb (91.2 kg)    Fetal Status: Fetal Heart Rate (bpm): 150 Fundal Height: 38 cm Movement: Present  General:  Alert, oriented and cooperative. Patient is in no acute distress.  Skin: Skin is warm and dry. No rash noted.   Cardiovascular: Normal heart rate noted.  Respiratory: Normal respiratory effort, no problems with respiration noted.  Abdomen: Soft, gravid, appropriate for gestational age.  Pain/Pressure: Absent.     Pelvic: Cervical exam deferred per patient preference.        Extremities: Normal range of motion.     Mental Status: Normal mood and affect. Normal behavior. Normal judgment and thought content.   Assessment and Plan:  Pregnancy: G2P1001 at [redacted]w[redacted]d  1. Supervision of high risk pregnancy in third  trimester 2. [redacted] weeks gestation of pregnancy Progressing well. No concerns today. FH and FHT within normal limits. Up to date on all labs and screenings. Induction scheduled for 10/10.   3. Chronic benign essential hypertension, antepartum BP near baseline today. Compliant with Procardia 60 mg XL and Labetalol 300 mg BID. Asymptomatic. Will continue current regimen.   4. Diet controlled gestational diabetes mellitus (GDM) in third trimester Reviewed glucose log with patient today. All CBGs within normal ranges. Encouraged patient to continue her efforts with dietary modifications to ensure stable glycemic control.   5. GBS (group B Streptococcus carrier), +RV culture, currently pregnant Plan for PCN in labor.   6. Poor fetal growth affecting management of mother in third trimester, single or unspecified fetus 7. Congenital pulmonary airway malformation (CPAM) Following with MFM. FGR resolved. CPAM noted, left lung. Last BPP on 9/27 normal 8/8. Will plan for further evaluation after delivery.   Term labor symptoms and general obstetric precautions including but not limited to vaginal bleeding, contractions, leaking of fluid and fetal movement were reviewed in detail with the patient.  Please refer to After Visit Summary for other counseling recommendations.   Return for IOL on 10/10 as scheduled.  Future Appointments  Date Time Provider Department Center  01/02/2021  6:30 AM [facility] SCHED ROOM [facility] None   Worthy Rancher, MD

## 2020-12-27 NOTE — Progress Notes (Signed)
Pt has no concerns today, states blood sugars are good. Scheduled for IOL 10/10.

## 2021-01-02 ENCOUNTER — Other Ambulatory Visit: Payer: Self-pay

## 2021-01-02 ENCOUNTER — Inpatient Hospital Stay (HOSPITAL_COMMUNITY): Payer: Managed Care, Other (non HMO)

## 2021-01-02 ENCOUNTER — Inpatient Hospital Stay (HOSPITAL_COMMUNITY)
Admission: AD | Admit: 2021-01-02 | Discharge: 2021-01-04 | DRG: 798 | Disposition: A | Discharge status: Home / Self Care | Payer: Managed Care, Other (non HMO) | Attending: Obstetrics and Gynecology | Admitting: Obstetrics and Gynecology

## 2021-01-02 ENCOUNTER — Encounter (HOSPITAL_COMMUNITY): Payer: Self-pay | Admitting: Obstetrics and Gynecology

## 2021-01-02 DIAGNOSIS — O114 Pre-existing hypertension with pre-eclampsia, complicating childbirth: Principal | ICD-10-CM | POA: Diagnosis present

## 2021-01-02 DIAGNOSIS — O99214 Obesity complicating childbirth: Secondary | ICD-10-CM | POA: Diagnosis present

## 2021-01-02 DIAGNOSIS — Z8279 Family history of other congenital malformations, deformations and chromosomal abnormalities: Secondary | ICD-10-CM

## 2021-01-02 DIAGNOSIS — O99824 Streptococcus B carrier state complicating childbirth: Secondary | ICD-10-CM | POA: Diagnosis present

## 2021-01-02 DIAGNOSIS — Z302 Encounter for sterilization: Secondary | ICD-10-CM

## 2021-01-02 DIAGNOSIS — O119 Pre-existing hypertension with pre-eclampsia, unspecified trimester: Secondary | ICD-10-CM

## 2021-01-02 DIAGNOSIS — Z3490 Encounter for supervision of normal pregnancy, unspecified, unspecified trimester: Secondary | ICD-10-CM | POA: Diagnosis present

## 2021-01-02 DIAGNOSIS — Z3A39 39 weeks gestation of pregnancy: Secondary | ICD-10-CM

## 2021-01-02 DIAGNOSIS — O1404 Mild to moderate pre-eclampsia, complicating childbirth: Secondary | ICD-10-CM

## 2021-01-02 DIAGNOSIS — O365930 Maternal care for other known or suspected poor fetal growth, third trimester, not applicable or unspecified: Secondary | ICD-10-CM | POA: Diagnosis not present

## 2021-01-02 DIAGNOSIS — O24420 Gestational diabetes mellitus in childbirth, diet controlled: Secondary | ICD-10-CM | POA: Diagnosis present

## 2021-01-02 DIAGNOSIS — O1002 Pre-existing essential hypertension complicating childbirth: Secondary | ICD-10-CM | POA: Diagnosis present

## 2021-01-02 DIAGNOSIS — O99820 Streptococcus B carrier state complicating pregnancy: Secondary | ICD-10-CM

## 2021-01-02 DIAGNOSIS — O0993 Supervision of high risk pregnancy, unspecified, third trimester: Secondary | ICD-10-CM

## 2021-01-02 DIAGNOSIS — Z20822 Contact with and (suspected) exposure to covid-19: Secondary | ICD-10-CM | POA: Diagnosis present

## 2021-01-02 HISTORY — DX: Pre-existing hypertension with pre-eclampsia, unspecified trimester: O11.9

## 2021-01-02 LAB — COMPREHENSIVE METABOLIC PANEL
ALT: 17 U/L (ref 0–44)
AST: 22 U/L (ref 15–41)
Albumin: 2.9 g/dL — ABNORMAL LOW (ref 3.5–5.0)
Alkaline Phosphatase: 123 U/L (ref 38–126)
Anion gap: 10 (ref 5–15)
BUN: 10 mg/dL (ref 6–20)
CO2: 18 mmol/L — ABNORMAL LOW (ref 22–32)
Calcium: 9 mg/dL (ref 8.9–10.3)
Chloride: 106 mmol/L (ref 98–111)
Creatinine, Ser: 0.69 mg/dL (ref 0.44–1.00)
GFR, Estimated: >60 mL/min (ref >60)
Glucose, Bld: 109 mg/dL — ABNORMAL HIGH (ref 70–99)
Potassium: 4.1 mmol/L (ref 3.5–5.1)
Sodium: 134 mmol/L — ABNORMAL LOW (ref 135–145)
Total Bilirubin: 0.5 mg/dL (ref 0.3–1.2)
Total Protein: 6.4 g/dL — ABNORMAL LOW (ref 6.5–8.1)

## 2021-01-02 LAB — CBC
HCT: 36.9 % (ref 36.0–46.0)
Hemoglobin: 12.1 g/dL (ref 12.0–15.0)
MCH: 26 pg (ref 26.0–34.0)
MCHC: 32.8 g/dL (ref 30.0–36.0)
MCV: 79.4 fL — ABNORMAL LOW (ref 80.0–100.0)
Platelets: 196 10*3/uL (ref 150–400)
RBC: 4.65 MIL/uL (ref 3.87–5.11)
RDW: 14.2 % (ref 11.5–15.5)
WBC: 8.6 10*3/uL (ref 4.0–10.5)
nRBC: 0 % (ref 0.0–0.2)

## 2021-01-02 LAB — GLUCOSE, CAPILLARY
Glucose-Capillary: 123 mg/dL — ABNORMAL HIGH (ref 70–99)
Glucose-Capillary: 99 mg/dL (ref 70–99)
Glucose-Capillary: 97 mg/dL (ref 70–99)

## 2021-01-02 LAB — TYPE AND SCREEN
ABO/RH(D): B POS
Antibody Screen: NEGATIVE

## 2021-01-02 LAB — RESP PANEL BY RT-PCR (FLU A&B, COVID) ARPGX2
Influenza A by PCR: NEGATIVE
Influenza B by PCR: NEGATIVE
SARS Coronavirus 2 by RT PCR: NEGATIVE

## 2021-01-02 LAB — RPR: RPR Ser Ql: NONREACTIVE

## 2021-01-02 MED ORDER — SODIUM CHLORIDE 0.9 % IV SOLN
5.0000 10*6.[IU] | Freq: Once | INTRAVENOUS | Status: AC
  Administered 2021-01-02: 5 10*6.[IU] via INTRAVENOUS
  Filled 2021-01-02: qty 5

## 2021-01-02 MED ORDER — MAGNESIUM SULFATE 40 GM/1000ML IV SOLN
2.0000 g/h | INTRAVENOUS | Status: DC
Start: 2021-01-02 — End: 2021-01-02
  Filled 2021-01-02: qty 1000

## 2021-01-02 MED ORDER — LACTATED RINGERS IV SOLN: INTRAVENOUS | Status: DC

## 2021-01-02 MED ORDER — ACETAMINOPHEN 325 MG PO TABS: 650.0000 mg | ORAL_TABLET | ORAL | Status: DC | PRN

## 2021-01-02 MED ORDER — OXYTOCIN-SODIUM CHLORIDE 30-0.9 UT/500ML-% IV SOLN
2.5000 [IU]/h | INTRAVENOUS | Status: DC
  Administered 2021-01-02: 2.5 [IU]/h via INTRAVENOUS
  Filled 2021-01-02: qty 500

## 2021-01-02 MED ORDER — TERBUTALINE SULFATE 1 MG/ML IJ SOLN: 0.2500 mg | Freq: Once | INTRAMUSCULAR | Status: DC | PRN

## 2021-01-02 MED ORDER — PENICILLIN G POT IN DEXTROSE 60000 UNIT/ML IV SOLN
3.0000 10*6.[IU] | INTRAVENOUS | Status: DC
  Administered 2021-01-02 (×2): 3 10*6.[IU] via INTRAVENOUS
  Filled 2021-01-02 (×2): qty 50

## 2021-01-02 MED ORDER — LIDOCAINE HCL (PF) 1 % IJ SOLN: 30.0000 mL | INTRAMUSCULAR | Status: DC | PRN

## 2021-01-02 MED ORDER — LACTATED RINGERS IV SOLN
500.0000 mL | INTRAVENOUS | Status: DC | PRN
  Administered 2021-01-02: 1000 mL via INTRAVENOUS

## 2021-01-02 MED ORDER — LABETALOL HCL 5 MG/ML IV SOLN
INTRAVENOUS | Status: AC
  Filled 2021-01-02: qty 4

## 2021-01-02 MED ORDER — HYDRALAZINE HCL 20 MG/ML IJ SOLN: 10.0000 mg | INTRAMUSCULAR | Status: DC | PRN

## 2021-01-02 MED ORDER — LABETALOL HCL 5 MG/ML IV SOLN
20.0000 mg | INTRAVENOUS | Status: DC | PRN
  Administered 2021-01-02: 20 mg via INTRAVENOUS

## 2021-01-02 MED ORDER — FENTANYL CITRATE (PF) 100 MCG/2ML IJ SOLN
100.0000 ug | INTRAMUSCULAR | Status: DC | PRN
  Administered 2021-01-02 (×5): 100 ug via INTRAVENOUS
  Filled 2021-01-02 (×5): qty 2

## 2021-01-02 MED ORDER — OXYCODONE-ACETAMINOPHEN 5-325 MG PO TABS: 1.0000 | ORAL_TABLET | ORAL | Status: DC | PRN

## 2021-01-02 MED ORDER — OXYTOCIN BOLUS FROM INFUSION
333.0000 mL | Freq: Once | INTRAVENOUS | Status: AC
  Administered 2021-01-02: 333 mL via INTRAVENOUS

## 2021-01-02 MED ORDER — LACTATED RINGERS AMNIOINFUSION: INTRAVENOUS | Status: DC

## 2021-01-02 MED ORDER — SOD CITRATE-CITRIC ACID 500-334 MG/5ML PO SOLN: 30.0000 mL | ORAL | Status: DC | PRN

## 2021-01-02 MED ORDER — ONDANSETRON HCL 4 MG/2ML IJ SOLN: 4.0000 mg | Freq: Four times a day (QID) | INTRAMUSCULAR | Status: DC | PRN

## 2021-01-02 MED ORDER — MAGNESIUM SULFATE 40 GM/1000ML IV SOLN
2.0000 g/h | INTRAVENOUS | Status: DC
Start: 2021-01-03 — End: 2021-01-03

## 2021-01-02 MED ORDER — MAGNESIUM SULFATE BOLUS VIA INFUSION
4.0000 g | Freq: Once | INTRAVENOUS | Status: AC
  Administered 2021-01-02: 4 g via INTRAVENOUS
  Filled 2021-01-02: qty 1000

## 2021-01-02 MED ORDER — OXYTOCIN-SODIUM CHLORIDE 30-0.9 UT/500ML-% IV SOLN
1.0000 m[IU]/min | INTRAVENOUS | Status: DC
  Administered 2021-01-02: 2 m[IU]/min via INTRAVENOUS

## 2021-01-02 MED ORDER — OXYCODONE-ACETAMINOPHEN 5-325 MG PO TABS: 2.0000 | ORAL_TABLET | ORAL | Status: DC | PRN

## 2021-01-02 MED ORDER — LABETALOL HCL 5 MG/ML IV SOLN
40.0000 mg | INTRAVENOUS | Status: DC | PRN
Start: 2021-01-02 — End: 2021-01-03
  Administered 2021-01-02: 40 mg via INTRAVENOUS
  Filled 2021-01-02: qty 8

## 2021-01-02 MED ORDER — NIFEDIPINE ER OSMOTIC RELEASE 60 MG PO TB24
60.0000 mg | ORAL_TABLET | Freq: Every day | ORAL | Status: DC
Start: 2021-01-03 — End: 2021-01-03
  Administered 2021-01-03: 60 mg via ORAL
  Filled 2021-01-02: qty 1

## 2021-01-02 MED ORDER — LABETALOL HCL 5 MG/ML IV SOLN: 80.0000 mg | INTRAVENOUS | Status: DC | PRN

## 2021-01-02 NOTE — Discharge Summary (Signed)
Postpartum Discharge Summary     Patient Name: Gloria Kemp DOB: November 20, 1995 MRN: [id number]  Date of admission: 01/02/2021 Delivery date:01/02/2021  Delivering provider: Julianne Handler  Date of discharge: 01/04/2021  Admitting diagnosis: Encounter for induction of labor [Z34.90] Intrauterine pregnancy: [redacted]w[redacted]d    Secondary diagnosis:  Active Problems:   Encounter for induction of labor   SVD (spontaneous vaginal delivery)   Chronic hypertension with superimposed pre-eclampsia  Additional problems: GBS carrier, IUGR, hx child with VSD    Discharge diagnosis: Term Pregnancy Delivered, CHTN with superimposed preeclampsia, and GDM A1                                              Post partum procedures: Augmentation: AROM and Pitocin Complications: None  Hospital course: Induction of Labor With Vaginal Delivery   25y.o. yo G2P1001 at 330w0das admitted to the hospital 01/02/2021 for induction of labor.  Indication for induction: Preeclampsia and A1 DM.  Patient had an uncomplicated labor course as follows: Membrane Rupture Time/Date: 1:39 PM ,01/02/2021   Delivery Method:Vaginal, Spontaneous  Episiotomy: None  Lacerations:  None  Details of delivery can be found in separate delivery note.  Patient had a routine postpartum course. She received magnesium sulfate for seizure prophylaxis. Her procardia was continued postpartum with good results. Patient underwent a pp BTL for contraception. Patient is discharged home 01/04/21.  Newborn Data: Birth date:01/02/2021  Birth time:6:07 PM  Gender:Female  Living status:Living  Apgars:6 ,8  Weight:2720 g   Magnesium Sulfate received: Yes: Seizure prophylaxis BMZ received: No Rhophylac:N/A MMR:No T-DaP: no Flu: No Transfusion:No  Physical exam  Vitals:   01/03/21 2006 01/04/21 0027 01/04/21 0442 01/04/21 0800  BP: (!) 148/85 138/81 137/83 123/71  Pulse: (!) 105 (!) 102 98 92  Resp: _0 Temp: 98.3 F (36.8 C) 98.3  F (36.8 C) 98.1 F (36.7 C) 98 F (36.7 C)  TempSrc: Oral Oral Oral Oral  SpO2: 99% 100% 99% 100%  Weight:      Height:       General: alert, cooperative, and no distress Lochia: appropriate Uterine Fundus: firm Incision: Dressing is clean, dry, and intact DVT Evaluation: No evidence of DVT seen on physical exam. Labs: Lab Results  Component Value Date   WBC 12.6 (H) 01/03/2021   HGB 11.8 (L) 01/03/2021   HCT 35.5 (L) 01/03/2021   MCV 78.2 (L) 01/03/2021   PLT 206 01/03/2021   CMP Latest Ref Rng & Units 01/03/2021  Glucose 70 - 99 mg/dL 99  BUN 6 - 20 mg/dL 7  Creatinine 0.44 - 1.00 mg/dL 0.64  Sodium 135 - 145 mmol/L 132(L)  Potassium 3.5 - 5.1 mmol/L 4.1  Chloride 98 - 111 mmol/L 101  CO2 22 - 32 mmol/L 21(L)  Calcium 8.9 - 10.3 mg/dL 8.5(L)  Total Protein 6.5 - 8.1 g/dL 5.7(L)  Total Bilirubin 0.3 - 1.2 mg/dL 0.6  Alkaline Phos 38 - 126 U/L 108  AST 15 - 41 U/L 20  ALT 0 - 44 U/L 15   Edinburgh Score: Edinburgh Postnatal Depression Scale Screening Tool 01/03/2021  I have been able to laugh and see the funny side of things. 0  I have looked forward with enjoyment to things. 0  I have blamed myself unnecessarily when things went wrong. 0  I have been anxious  or worried for no good reason. 0  I have felt scared or panicky for no good reason. 0  Things have been getting on top of me. 0  I have been so unhappy that I have had difficulty sleeping. 0  I have felt sad or miserable. 0  I have been so unhappy that I have been crying. 0  The thought of harming myself has occurred to me. 0  Edinburgh Postnatal Depression Scale Total 0     After visit meds:  Allergies as of 01/04/2021       Reactions   Shellfish Allergy Shortness Of Breath        Medication List     STOP taking these medications    Accu-Chek Guide test strip Generic drug: glucose blood   Accu-Chek Guide w/Device Kit   Accu-Chek Softclix Lancets lancets   acetaminophen 325 MG  tablet Commonly known as: Tylenol   aspirin EC 81 MG tablet   Blood Pressure Monitor Kit   labetalol 300 MG tablet Commonly known as: NORMODYNE       TAKE these medications    ibuprofen 600 MG tablet Commonly known as: ADVIL Take 1 tablet (600 mg total) by mouth every 6 (six) hours.   NIFEdipine 60 MG 24 hr tablet Commonly known as: PROCARDIA XL/NIFEDICAL XL Take 1 tablet (60 mg total) by mouth daily. What changed: Another medication with the same name was added. Make sure you understand how and when to take each.   NIFEdipine 60 MG 24 hr tablet Commonly known as: PROCARDIA XL/NIFEDICAL XL Take 1 tablet (60 mg total) by mouth daily. Can increase to twice a day as needed for symptomatic contractions What changed: You were already taking a medication with the same name, and this prescription was added. Make sure you understand how and when to take each.   PRENATAL VITAMIN PO Take by mouth.         Discharge home in stable condition Infant Feeding: Breast Infant Disposition:home with mother Discharge instruction: per After Visit Summary and Postpartum booklet. Activity: Advance as tolerated. Pelvic rest for 6 weeks.  Diet: routine diet Future Appointments: Future Appointments  Date Time Provider [place]  01/10/2021 10:55 AM Woodroe Mode, MD [facility] None  02/13/2021  8:15 AM Kedra Mcglade, Vickii Chafe, MD [place] None  02/13/2021  9:00 AM [facility] [place] None   Follow up Visit:  [place] Follow up on 01/10/2021.   Why: An appointment has been scheduled for you to check on your blood pressure Contact information: [address] [place] [address] [phone]                 Please schedule this patient for a In person postpartum visit in 6 weeks with the following provider: Any provider. Additional Postpartum F/U:Incision check 1 week and BP check 1 week  High risk  pregnancy complicated by: GDM and HTN Delivery mode:  Vaginal, Spontaneous  Anticipated Birth Control:  BTL done PP   01/04/2021 Mora Bellman, MD

## 2021-01-02 NOTE — Progress Notes (Signed)
Labor Progress Note Dorcas Manthei is a 25 y.o. G2P1001 at [redacted]w[redacted]d presented for IOL for CHTN, A1GDM. Recent onset of severe BPs.   S:  Feeling more painful ctx, Fentanyl helping less. Declines epidural.   O:  BP (!) 155/102   Pulse 83   Temp 98.1 F (36.7 C) (Oral)   Resp 18   Ht 5\' 2"  (1.575 m)   Wt 92.8 kg   LMP 04/04/2020   BMI 37.40 kg/m  EFM: baseline 120 bpm/ mod variability/ no accels/ variable, early decels  Toco/IUPC: 2-4 SVE: 6/80/-2 Pitocin: 14 mu/min  A/P: 25 y.o. G2P1001 [redacted]w[redacted]d  1. Labor: early active 2. FWB: Cat II 3. Pain: analgesia/anesthesia/NO prn 4. CHTN w/si PEC, severe 5. A1GDM: stable  Now with severe range BPs. Start IV Labetalol protocol and Mg. Discussed with pt and mother. Continue Pitocin. IUPC placed, amnioinfusion if variables worsen. Dr. [redacted]w[redacted]d updated. Anticipate SVD.  Crissie Reese, CNM 4:32 PM

## 2021-01-02 NOTE — H&P (Signed)
OBSTETRIC ADMISSION HISTORY AND PHYSICAL  Gloria Kemp is a 25 y.o. female G2P1001 with IUP at 56w0dpresenting for IOL for CHTN, A1GDM. She reports +FMs. No LOF, VB, blurry vision, headaches, peripheral edema, or RUQ pain. She plans on breastfeeding. She requests BTL for birth control.  Dating: By LMP --->  Estimated Date of Delivery: 01/09/21  Sono:    _0 , normal anatomy and EIF, ceph presentation, 2557g, 17%ile, EFW 5'10  Prenatal History/Complications: - hx of child with VSD: normal fetal echo - EIF on [place] declined amnio and genetic screen - A1GDM - GBS carrier - IUGR: resolved _1   Past Medical History: Past Medical History:  Diagnosis Date   Gestational diabetes 10/27/2018   Possible VSD seen on fetal echo.  Current Diabetic Medications:  None  _2  Aspirin 81 mg daily after 12 weeks (? A2/B GDM)  Required Referrals for A1GDM or A2GDM: _3  Diabetes Education and Testing Supplies _4  Nutrition Cousult  For A2/B GDM or higher classes of DM _5  Diabetes Education and Testing Supplies _6  Nutrition Counsult _7  Fetal ECHO after 20 weeks  _8  Eye exam for retina evaluation    Hypertension    IUGR (intrauterine growth restriction) affecting care of mother 02/12/2019   02/11/19: 8th%tile    Follow up UA dopplers in 2 weeks  NST in 1 week  Repeat growth in 3-4 weeks.  Initiate BPP at 32 weeks.  Guidelines for Antenatal Testing and Sonography  (with updated ICD-10 codes)  Updated 708/14/2020with Dr. RTama High IUGR- [phone]   EFW < 10%, nml Dopplers & AFV, AC<3%, - other comorbidities        EFW < 10% w/ AEDF & low AFV or EFW < 3%   Q 3-4wks   As per MFM  At d   Supervision of normal first pregnancy 09/16/2018    Nursing Staff Provider Office Location  [facility] Dating  LMP Language   English Anatomy [place] Normal, but possible VSD on echo, repeat echo normal Flu Vaccine  Declined 12-11-18 Genetic Screen  NIPS:  Insufficient fetal fraction, declined testing  TDaP vaccine   Declined  03/06/19 Hgb A1C or  GTT Early: 5.7 Third trimester Failed 2 hr on 10/24/18 Rhogam   n/a   LAB RESULTS  Feeding Plan  Breastfeed     Past Surgical History: Past Surgical History:  Procedure Laterality Date   ADENOIDECTOMY     EYE SURGERY      Obstetrical History: OB History     Gravida  2   Para  1   Term  1   Preterm      AB      Living  1      SAB      IAB      Ectopic      Multiple  0   Live Births  1           Social History: Social History   Socioeconomic History   Marital status: Single    Spouse name: Not on file   Number of children: 0   Years of education: Not on file   Highest education level: Not on file  Occupational History   Occupation: [employer]  Tobacco Use   Smoking status: Never   Smokeless tobacco: Never  Vaping Use   Vaping Use: Never used  Substance and Sexual Activity   Alcohol use: Not Currently    Comment: not since confirmed pregnancy  Drug use: Never   Sexual activity: Yes    Birth control/protection: None  Other Topics Concern   Not on file  Social History Narrative   Not on file   Social Determinants of Health   Financial Resource Strain: Not on file  Food Insecurity: Not on file  Transportation Needs: Not on file  Physical Activity: Not on file  Stress: Not on file  Social Connections: Not on file    Family History: Family History  Problem Relation Age of Onset   Hypertension Mother    Obesity Mother    Diabetes Father    Hearing loss Father    Hypertension Father    Obesity Father    Stroke Paternal Aunt    Kidney disease Paternal Uncle    Hypertension Maternal Grandmother    Obesity Maternal Grandmother    Cancer Maternal Grandfather    Arthritis Paternal Grandmother    Diabetes Paternal Grandmother    Hypertension Paternal Grandmother    Miscarriages / Stillbirths Paternal Grandmother    Obesity Paternal Grandmother     Allergies: Allergies  Allergen Reactions   Shellfish Allergy  Shortness Of Breath    Medications Prior to Admission  Medication Sig Dispense Refill Last Dose   aspirin EC 81 MG tablet Take 1 tablet (81 mg total) by mouth daily. Take after 12 weeks for prevention of preeclampsia later in pregnancy 300 tablet 2 01/01/2021   labetalol (NORMODYNE) 300 MG tablet Take 1 tablet (300 mg total) by mouth 2 (two) times daily. 60 tablet 2 01/02/2021   NIFEdipine (PROCARDIA XL/NIFEDICAL XL) 60 MG 24 hr tablet Take 1 tablet (60 mg total) by mouth daily. 30 tablet 2 01/02/2021   Prenatal Vit-Fe Fumarate-FA (PRENATAL VITAMIN PO) Take by mouth.   01/01/2021   Accu-Chek Softclix Lancets lancets Use 1 lancet each to check blood glucose 4 times daily 100 each 12    acetaminophen (TYLENOL) 325 MG tablet Take 2 tablets (650 mg total) by mouth every 4 (four) hours as needed (for pain scale < 4). (Patient not taking: Reported on 12/19/2020) 30 tablet 0    Blood Glucose Monitoring Suppl (ACCU-CHEK GUIDE) w/Device KIT Use glucose meter to monitor blood sugars 4 times daily 1 kit 0    Blood Pressure Monitor KIT 1 Device by Does not apply route once a week. To be monitored Regularly at home. 1 kit 0    glucose blood (ACCU-CHEK GUIDE) test strip Use 1 test strip to check blood glucose 4 times daily 100 each 12    Review of Systems:  All systems reviewed and negative except as stated in HPI  PE: Blood pressure 133/80, pulse (!) 106, resp. rate 18, height _0  (1.575 m), weight 92.8 kg, last menstrual period 04/04/2020, currently breastfeeding. General appearance: alert, cooperative, and no distress Lungs: regular rate and effort Heart: regular rate  Abdomen: soft, non-tender Extremities: Homans sign is negative, no sign of DVT Presentation: cephalic EFM: 938 bpm, mod variability, + accels, no decels Toco: rare Dilation: 3 Effacement (%): 60 Exam by:: Colman Cater CNM  Prenatal labs: ABO, Rh: --/--/PENDING (10/10 0758) Antibody: PENDING (10/10 0758) Rubella: 5.30 (04/05  1621) RPR: Non Reactive (04/05 1621)  HBsAg: Negative (04/05 1621)  HIV: Non Reactive (04/05 1621)  GBS: Positive/-- (09/12 0329)  2 hr GTT(early) 100/169/160  Prenatal Transfer Tool  Maternal Diabetes: Yes:  Diabetes Type:  Pre-pregnancy, Diet controlled Genetic Screening: Declined Maternal Ultrasounds/Referrals: IUGR and Isolated EIF (echogenic intracardiac focus) Fetal Ultrasounds or other  Referrals:  Fetal echo, Referred to Materal Fetal Medicine  Maternal Substance Abuse:  No Significant Maternal Medications:  Meds include: Other: Labetalol, Procardia Significant Maternal Lab Results: Group B Strep positive  Results for orders placed or performed during the hospital encounter of 01/02/21 (from the past 24 hour(s))  CBC   Collection Time: 01/02/21  7:58 AM  Result Value Ref Range   WBC 8.6 4.0 - 10.5 K/uL   RBC 4.65 3.87 - 5.11 MIL/uL   Hemoglobin 12.1 12.0 - 15.0 g/dL   HCT 36.9 36.0 - 46.0 %   MCV 79.4 (L) 80.0 - 100.0 fL   MCH 26.0 26.0 - 34.0 pg   MCHC 32.8 30.0 - 36.0 g/dL   RDW 14.2 11.5 - 15.5 %   Platelets 196 150 - 400 K/uL   nRBC 0.0 0.0 - 0.2 %  Type and screen   Collection Time: 01/02/21  7:58 AM  Result Value Ref Range   ABO/RH(D) PENDING    Antibody Screen PENDING    Sample Expiration      01/05/2021,2359 Performed at [facility], [place] [address], [place], [place] [address]     Patient Active Problem List   Diagnosis Date Noted   Encounter for induction of labor 01/02/2021   GBS (group B Streptococcus carrier), +RV culture, currently pregnant 12/07/2020   Diabetes mellitus arising in pregnancy 08/16/2020   Prediabetes 08/08/2020   Child with VSD (ventricular septal defect) 08/08/2020   History of gestational diabetes 06/28/2020   History of prior pregnancy with IUGR newborn 06/28/2020   Supervision of high-risk pregnancy 06/23/2020   IUGR (intrauterine growth restriction) affecting care of mother 02/12/2019   Chronic benign essential  hypertension, antepartum 01/08/2019   Obesity in pregnancy 10/19/2018   Alpha thalassemia silent carrier 10/16/2018    Assessment: Gloria Kemp is a 25 y.o. G2P1001 at 35w0dhere for IOL  1. Labor: latent 2. FWB: Cat I 3. Pain: analgesia/anesthesia prn 4. GBS: pos 5. CHTN: stable 6. A1GDM: stable   Plan: Admit to LD Pitocin IOL PCN Check CBGs PEC labs Anticipate SVD  MJulianne Handler CNM  01/02/2021, 9:10 AM

## 2021-01-02 NOTE — Lactation Note (Signed)
This note was copied from a baby's chart. Lactation Consultation Note  Patient Name: Girl Galileah Piggee [id number] Date: 01/02/2021 Reason for consult: L&D Initial assessment;Mother's request;Term;Other (Comment) (PIH) Age:24 hours  LC assisted with latching infant at breast for with signs of milk transfer. Infant bringing up colostrum hard for her to sustain the latch. Mom able to hand express and offer colostrum at breast.  Mom to receive further LC support on the floor.   Maternal Data Has patient been taught Hand Expression?: Yes Does the patient have breastfeeding experience prior to this delivery?: Yes How long did the patient breastfeed?: 4 months pump and bottle feeding  Feeding Mother's Current Feeding Choice: Breast Milk  LATCH Score Latch: Repeated attempts needed to sustain latch, nipple held in mouth throughout feeding, stimulation needed to elicit sucking reflex.  Audible Swallowing: Spontaneous and intermittent  Type of Nipple: Everted at rest and after stimulation (nipples are small)  Comfort (Breast/Nipple): Soft / non-tender  Hold (Positioning): Assistance needed to correctly position infant at breast and maintain latch.  LATCH Score: 8   Lactation Tools Discussed/Used    Interventions Interventions: Breast feeding basics reviewed;Support pillows;Education;Assisted with latch;Skin to skin;Expressed milk;Breast massage;Hand express;Breast compression;Adjust position  Discharge    Consult Status Consult Status: Follow-up Date: 01/03/21 Follow-up type: In-patient    Syncere Kaminski  [name] 01/02/2021, 7:00 PM

## 2021-01-02 NOTE — Progress Notes (Signed)
Labor Progress Note Gloria Kemp is a 25 y.o. G2P1001 at [redacted]w[redacted]d presented for IOL   S:  Feeling more painful ctx, had recent dose of Fentanyl  O:  BP 134/71   Pulse 88   Temp 98 F (36.7 C) (Oral)   Resp 18   Ht 5\' 2"  (1.575 m)   Wt 92.8 kg   LMP 04/04/2020   BMI 37.40 kg/m  EFM: baseline 130 bpm/ mod variability/ + accels/ no decels  Toco/IUPC: 3-5 SVE: Dilation: 4 Effacement (%): 60 Presentation: Vertex Exam by:: [email or web address] CNM Pitocin: 12 mu/min  A/P: 25 y.o. G2P1001 [redacted]w[redacted]d  1. Labor: latent 2. FWB: Cat I 3. Pain: analgesia/anesthesia/NO prn 4. CHTN: stable 5. A1GDM: stable  Consented for AROM, AROM for small amt clear fluid. Anticipate labor progress and SVD.  [redacted]w[redacted]d, CNM 1:43 PM

## 2021-01-02 NOTE — Lactation Note (Signed)
This note was copied from a baby's chart. Lactation Consultation Note  Patient Name: Gloria Kemp NIOEV'O Date: 01/02/2021 Reason for consult: Initial assessment;Mother's request;Difficult latch;Term;Infant < 6lbs;Maternal endocrine disorder (PIH ( Mg)) Age:25 years  Mom given 17 ml of formula before LC arrival. LC not able to access a latch.   Mom denied latching infant in room since her arrival to [facility] specialty care.   Plan 1. To feed based on cues 8-12x 24 hr period. Mom to offer breast and with compression look for signs of milk transfer.  2. Mom to supplement with EBM first followed by formula using slow flow nipple and pace bottle feeding. LPTI guidelines reviewed to reduce calorie loss given birthweight under 6 lbs. Mom aware to keep total feeding under 30 min 3. DEBP q 3 hrs for 15 min  4. I and O sheet reviewed.  All questions answered at the end of the visit.   Maternal Data Has patient been taught Hand Expression?: Yes Does the patient have breastfeeding experience prior to this delivery?: Yes How long did the patient breastfeed?: 4 months pumped and bottle fed  Feeding Mother's Current Feeding Choice: Breast Milk and Formula Nipple Type: Slow - flow  LATCH Score                    Lactation Tools Discussed/Used Tools: Pump;Flanges Flange Size: 21 Breast pump type: Double-Electric Breast Pump Pump Education: Setup, frequency, and cleaning;Milk Storage Reason for Pumping: increase stimulation Pumping frequency: every 3 hrs for  Interventions Interventions: Breast feeding basics reviewed;Support pillows;Education;Position options;Pace feeding;Skin to skin;Expressed [employer];Infant Driven Feeding Algorithm education;DEBP;Breast compression  Discharge Pump: Manual WIC Program: Yes  Consult Status Consult Status: Follow-up Date: 01/03/21 Follow-up type: In-patient    Gloria Kemp   Gloria Kemp 01/02/2021, 11:33 PM

## 2021-01-03 ENCOUNTER — Inpatient Hospital Stay (HOSPITAL_COMMUNITY): Payer: Managed Care, Other (non HMO) | Admitting: Anesthesiology

## 2021-01-03 ENCOUNTER — Encounter (HOSPITAL_COMMUNITY): Payer: Self-pay | Admitting: Obstetrics and Gynecology

## 2021-01-03 ENCOUNTER — Encounter (HOSPITAL_COMMUNITY)
Admission: AD | Disposition: A | Discharge status: Home / Self Care | Payer: Self-pay | Source: Home / Self Care | Attending: Obstetrics and Gynecology

## 2021-01-03 DIAGNOSIS — Z302 Encounter for sterilization: Secondary | ICD-10-CM

## 2021-01-03 HISTORY — PX: TUBAL LIGATION: SHX77

## 2021-01-03 LAB — COMPREHENSIVE METABOLIC PANEL
ALT: 15 U/L (ref 0–44)
AST: 20 U/L (ref 15–41)
Albumin: 2.7 g/dL — ABNORMAL LOW (ref 3.5–5.0)
Alkaline Phosphatase: 108 U/L (ref 38–126)
Anion gap: 10 (ref 5–15)
BUN: 7 mg/dL (ref 6–20)
CO2: 21 mmol/L — ABNORMAL LOW (ref 22–32)
Calcium: 8.5 mg/dL — ABNORMAL LOW (ref 8.9–10.3)
Chloride: 101 mmol/L (ref 98–111)
Creatinine, Ser: 0.64 mg/dL (ref 0.44–1.00)
GFR, Estimated: >60 mL/min (ref >60)
Glucose, Bld: 99 mg/dL (ref 70–99)
Potassium: 4.1 mmol/L (ref 3.5–5.1)
Sodium: 132 mmol/L — ABNORMAL LOW (ref 135–145)
Total Bilirubin: 0.6 mg/dL (ref 0.3–1.2)
Total Protein: 5.7 g/dL — ABNORMAL LOW (ref 6.5–8.1)

## 2021-01-03 LAB — CBC
HCT: 35.5 % — ABNORMAL LOW (ref 36.0–46.0)
Hemoglobin: 11.8 g/dL — ABNORMAL LOW (ref 12.0–15.0)
MCH: 26 pg (ref 26.0–34.0)
MCHC: 33.2 g/dL (ref 30.0–36.0)
MCV: 78.2 fL — ABNORMAL LOW (ref 80.0–100.0)
Platelets: 206 10*3/uL (ref 150–400)
RBC: 4.54 MIL/uL (ref 3.87–5.11)
RDW: 14.4 % (ref 11.5–15.5)
WBC: 12.6 10*3/uL — ABNORMAL HIGH (ref 4.0–10.5)
nRBC: 0 % (ref 0.0–0.2)

## 2021-01-03 LAB — GLUCOSE, CAPILLARY: Glucose-Capillary: 112 mg/dL — ABNORMAL HIGH (ref 70–99)

## 2021-01-03 LAB — MAGNESIUM: Magnesium: 4.8 mg/dL — ABNORMAL HIGH (ref 1.7–2.4)

## 2021-01-03 SURGERY — LIGATION, FALLOPIAN TUBE, POSTPARTUM
Anesthesia: Spinal | Laterality: Bilateral

## 2021-01-03 MED ORDER — DEXAMETHASONE SODIUM PHOSPHATE 10 MG/ML IJ SOLN
INTRAMUSCULAR | Status: AC
  Filled 2021-01-03: qty 1

## 2021-01-03 MED ORDER — LIDOCAINE HCL (CARDIAC) PF 100 MG/5ML IV SOSY
PREFILLED_SYRINGE | INTRAVENOUS | Status: DC | PRN
  Administered 2021-01-03: 60 mg via INTRATRACHEAL

## 2021-01-03 MED ORDER — KETOROLAC TROMETHAMINE 30 MG/ML IJ SOLN
30.0000 mg | Freq: Once | INTRAMUSCULAR | Status: AC | PRN
  Administered 2021-01-03: 30 mg via INTRAVENOUS

## 2021-01-03 MED ORDER — OXYCODONE HCL 5 MG/5ML PO SOLN: 5.0000 mg | Freq: Once | ORAL | Status: DC | PRN

## 2021-01-03 MED ORDER — COCONUT OIL OIL: 1.0000 "application " | TOPICAL_OIL | Status: DC | PRN

## 2021-01-03 MED ORDER — SUGAMMADEX SODIUM 200 MG/2ML IV SOLN
INTRAVENOUS | Status: DC | PRN
  Administered 2021-01-03: 400 mg via INTRAVENOUS

## 2021-01-03 MED ORDER — PHENYLEPHRINE HCL-NACL 20-0.9 MG/250ML-% IV SOLN
INTRAVENOUS | Status: DC | PRN
  Administered 2021-01-03: 50 ug/min via INTRAVENOUS

## 2021-01-03 MED ORDER — LACTATED RINGERS IV SOLN: INTRAVENOUS | Status: DC

## 2021-01-03 MED ORDER — WITCH HAZEL-GLYCERIN EX PADS: 1.0000 "application " | MEDICATED_PAD | CUTANEOUS | Status: DC | PRN

## 2021-01-03 MED ORDER — ONDANSETRON HCL 4 MG PO TABS: 4.0000 mg | ORAL_TABLET | ORAL | Status: DC | PRN

## 2021-01-03 MED ORDER — BENZOCAINE-MENTHOL 20-0.5 % EX AERO: 1.0000 "application " | INHALATION_SPRAY | CUTANEOUS | Status: DC | PRN

## 2021-01-03 MED ORDER — LIDOCAINE 2% (20 MG/ML) 5 ML SYRINGE
INTRAMUSCULAR | Status: AC
  Filled 2021-01-03: qty 5

## 2021-01-03 MED ORDER — ACETAMINOPHEN 10 MG/ML IV SOLN
INTRAVENOUS | Status: DC | PRN
  Administered 2021-01-03: 1000 mg via INTRAVENOUS

## 2021-01-03 MED ORDER — MEASLES, MUMPS & RUBELLA VAC IJ SOLR: 0.5000 mL | Freq: Once | INTRAMUSCULAR | Status: DC

## 2021-01-03 MED ORDER — ONDANSETRON HCL 4 MG/2ML IJ SOLN
INTRAMUSCULAR | Status: AC
  Filled 2021-01-03: qty 2

## 2021-01-03 MED ORDER — TETANUS-DIPHTH-ACELL PERTUSSIS 5-2.5-18.5 LF-MCG/0.5 IM SUSY: 0.5000 mL | PREFILLED_SYRINGE | Freq: Once | INTRAMUSCULAR | Status: DC

## 2021-01-03 MED ORDER — IBUPROFEN 600 MG PO TABS
600.0000 mg | ORAL_TABLET | Freq: Four times a day (QID) | ORAL | Status: DC
  Administered 2021-01-03 – 2021-01-04 (×4): 600 mg via ORAL
  Filled 2021-01-03 (×3): qty 1

## 2021-01-03 MED ORDER — ACETAMINOPHEN 325 MG PO TABS: 650.0000 mg | ORAL_TABLET | ORAL | Status: DC | PRN

## 2021-01-03 MED ORDER — DIBUCAINE (PERIANAL) 1 % EX OINT: 1.0000 "application " | TOPICAL_OINTMENT | CUTANEOUS | Status: DC | PRN

## 2021-01-03 MED ORDER — MAGNESIUM SULFATE 40 GM/1000ML IV SOLN: 2.0000 g/h | INTRAVENOUS | Status: DC

## 2021-01-03 MED ORDER — SODIUM CHLORIDE 0.9% FLUSH
3.0000 mL | Freq: Two times a day (BID) | INTRAVENOUS | Status: DC
  Administered 2021-01-03: 3 mL via INTRAVENOUS

## 2021-01-03 MED ORDER — DEXAMETHASONE SODIUM PHOSPHATE 10 MG/ML IJ SOLN
INTRAMUSCULAR | Status: DC | PRN
  Administered 2021-01-03: 10 mg via INTRAVENOUS

## 2021-01-03 MED ORDER — MAGNESIUM SULFATE 40 GM/1000ML IV SOLN
2.0000 g/h | INTRAVENOUS | Status: AC
  Administered 2021-01-03: 2 g/h via INTRAVENOUS

## 2021-01-03 MED ORDER — SCOPOLAMINE 1 MG/3DAYS TD PT72
MEDICATED_PATCH | TRANSDERMAL | Status: DC | PRN
  Administered 2021-01-03: 1 via TRANSDERMAL

## 2021-01-03 MED ORDER — HYDROMORPHONE HCL 1 MG/ML IJ SOLN: 0.2500 mg | INTRAMUSCULAR | Status: DC | PRN

## 2021-01-03 MED ORDER — PHENOL 1.4 % MT LIQD
1.0000 | OROMUCOSAL | Status: DC | PRN
  Administered 2021-01-03: 1 via OROMUCOSAL
  Filled 2021-01-03: qty 177

## 2021-01-03 MED ORDER — SODIUM CHLORIDE 0.9% FLUSH: 3.0000 mL | INTRAVENOUS | Status: DC | PRN

## 2021-01-03 MED ORDER — ONDANSETRON HCL 4 MG/2ML IJ SOLN
4.0000 mg | INTRAMUSCULAR | Status: DC | PRN
  Administered 2021-01-03: 4 mg via INTRAVENOUS

## 2021-01-03 MED ORDER — SENNOSIDES-DOCUSATE SODIUM 8.6-50 MG PO TABS
2.0000 | ORAL_TABLET | ORAL | Status: DC
  Administered 2021-01-03: 2 via ORAL
  Filled 2021-01-03: qty 2

## 2021-01-03 MED ORDER — SIMETHICONE 80 MG PO CHEW: 80.0000 mg | CHEWABLE_TABLET | ORAL | Status: DC | PRN

## 2021-01-03 MED ORDER — PROPOFOL 10 MG/ML IV BOLUS
INTRAVENOUS | Status: DC | PRN
  Administered 2021-01-03: 200 mg via INTRAVENOUS

## 2021-01-03 MED ORDER — MENTHOL 3 MG MT LOZG
1.0000 | LOZENGE | OROMUCOSAL | Status: DC | PRN
  Administered 2021-01-03: 3 mg via ORAL
  Filled 2021-01-03: qty 9

## 2021-01-03 MED ORDER — DEXMEDETOMIDINE (PRECEDEX) IN NS 20 MCG/5ML (4 MCG/ML) IV SYRINGE
PREFILLED_SYRINGE | INTRAVENOUS | Status: DC | PRN
  Administered 2021-01-03 (×2): 4 ug via INTRAVENOUS
  Administered 2021-01-03: 8 ug via INTRAVENOUS
  Administered 2021-01-03: 4 ug via INTRAVENOUS

## 2021-01-03 MED ORDER — MAGNESIUM SULFATE 40 GM/1000ML IV SOLN
2.0000 g/h | INTRAVENOUS | Status: DC
  Administered 2021-01-03: 2 g/h via INTRAVENOUS
  Filled 2021-01-03: qty 1000

## 2021-01-03 MED ORDER — FENTANYL CITRATE (PF) 100 MCG/2ML IJ SOLN
INTRAMUSCULAR | Status: AC
  Filled 2021-01-03: qty 2

## 2021-01-03 MED ORDER — PROPOFOL 10 MG/ML IV BOLUS
INTRAVENOUS | Status: AC
  Filled 2021-01-03: qty 20

## 2021-01-03 MED ORDER — KETOROLAC TROMETHAMINE 30 MG/ML IJ SOLN
INTRAMUSCULAR | Status: AC
  Filled 2021-01-03: qty 1

## 2021-01-03 MED ORDER — DIPHENHYDRAMINE HCL 25 MG PO CAPS: 25.0000 mg | ORAL_CAPSULE | Freq: Four times a day (QID) | ORAL | Status: DC | PRN

## 2021-01-03 MED ORDER — BUPIVACAINE HCL (PF) 0.25 % IJ SOLN
INTRAMUSCULAR | Status: DC | PRN
  Administered 2021-01-03: 30 mL

## 2021-01-03 MED ORDER — ACETAMINOPHEN 10 MG/ML IV SOLN
INTRAVENOUS | Status: AC
  Filled 2021-01-03: qty 100

## 2021-01-03 MED ORDER — ONDANSETRON HCL 4 MG/2ML IJ SOLN: 4.0000 mg | INTRAMUSCULAR | Status: DC | PRN

## 2021-01-03 MED ORDER — ROCURONIUM BROMIDE 10 MG/ML (PF) SYRINGE
PREFILLED_SYRINGE | INTRAVENOUS | Status: AC
  Filled 2021-01-03: qty 10

## 2021-01-03 MED ORDER — SUCCINYLCHOLINE CHLORIDE 200 MG/10ML IV SOSY
PREFILLED_SYRINGE | INTRAVENOUS | Status: DC | PRN
  Administered 2021-01-03: 150 mg via INTRAVENOUS

## 2021-01-03 MED ORDER — BUPIVACAINE HCL (PF) 0.25 % IJ SOLN
INTRAMUSCULAR | Status: AC
  Filled 2021-01-03: qty 30

## 2021-01-03 MED ORDER — NIFEDIPINE ER OSMOTIC RELEASE 60 MG PO TB24
60.0000 mg | ORAL_TABLET | Freq: Every day | ORAL | Status: DC
  Administered 2021-01-04: 60 mg via ORAL
  Filled 2021-01-03: qty 1

## 2021-01-03 MED ORDER — ROCURONIUM BROMIDE 100 MG/10ML IV SOLN
INTRAVENOUS | Status: DC | PRN
  Administered 2021-01-03: 50 mg via INTRAVENOUS

## 2021-01-03 MED ORDER — PRENATAL MULTIVITAMIN CH
1.0000 | ORAL_TABLET | Freq: Every day | ORAL | Status: DC
  Administered 2021-01-03 – 2021-01-04 (×2): 1 via ORAL
  Filled 2021-01-03 (×2): qty 1

## 2021-01-03 MED ORDER — OXYCODONE HCL 5 MG PO TABS: 5.0000 mg | ORAL_TABLET | Freq: Once | ORAL | Status: DC | PRN

## 2021-01-03 MED ORDER — PHENYLEPHRINE HCL (PRESSORS) 10 MG/ML IV SOLN
INTRAVENOUS | Status: DC | PRN
  Administered 2021-01-03: 80 ug via INTRAVENOUS

## 2021-01-03 MED ORDER — ZOLPIDEM TARTRATE 5 MG PO TABS: 5.0000 mg | ORAL_TABLET | Freq: Every evening | ORAL | Status: DC | PRN

## 2021-01-03 MED ORDER — IBUPROFEN 600 MG PO TABS
600.0000 mg | ORAL_TABLET | Freq: Four times a day (QID) | ORAL | Status: DC
  Administered 2021-01-03: 600 mg via ORAL
  Filled 2021-01-03: qty 1

## 2021-01-03 MED ORDER — SODIUM CHLORIDE 0.9 % IV SOLN: 250.0000 mL | INTRAVENOUS | Status: DC | PRN

## 2021-01-03 MED ORDER — OXYCODONE HCL 5 MG PO TABS: 5.0000 mg | ORAL_TABLET | Freq: Four times a day (QID) | ORAL | Status: DC | PRN

## 2021-01-03 MED ORDER — FENTANYL CITRATE (PF) 100 MCG/2ML IJ SOLN
INTRAMUSCULAR | Status: DC | PRN
  Administered 2021-01-03: 100 ug via INTRAVENOUS
  Administered 2021-01-03: 50 ug via INTRAVENOUS

## 2021-01-03 MED ORDER — PRENATAL MULTIVITAMIN CH: 1.0000 | ORAL_TABLET | Freq: Every day | ORAL | Status: DC

## 2021-01-03 MED ORDER — SENNOSIDES-DOCUSATE SODIUM 8.6-50 MG PO TABS: 2.0000 | ORAL_TABLET | Freq: Every day | ORAL | Status: DC

## 2021-01-03 MED ORDER — MEPERIDINE HCL 25 MG/ML IJ SOLN: 6.2500 mg | INTRAMUSCULAR | Status: DC | PRN

## 2021-01-03 MED ORDER — PROMETHAZINE HCL 25 MG/ML IJ SOLN: 6.2500 mg | INTRAMUSCULAR | Status: DC | PRN

## 2021-01-03 SURGICAL SUPPLY — 24 items
BLADE SURG 11 STRL SS (BLADE) ×2 IMPLANT
CLIP FILSHIE TUBAL LIGA STRL (Clip) ×1 IMPLANT
DRSG OPSITE POSTOP 3X4 (GAUZE/BANDAGES/DRESSINGS) ×2 IMPLANT
DURAPREP 26ML APPLICATOR (WOUND CARE) ×2 IMPLANT
GLOVE BIOGEL PI IND STRL 7.0 (GLOVE) ×1 IMPLANT
GLOVE BIOGEL PI IND STRL 7.5 (GLOVE) ×1 IMPLANT
GLOVE BIOGEL PI INDICATOR 7.0 (GLOVE) ×1
GLOVE BIOGEL PI INDICATOR 7.5 (GLOVE) ×1
GLOVE ECLIPSE 7.5 STRL STRAW (GLOVE) ×2 IMPLANT
GOWN STRL REUS W/TWL LRG LVL3 (GOWN DISPOSABLE) ×4 IMPLANT
HIBICLENS CHG 4% 4OZ BTL (MISCELLANEOUS) ×2 IMPLANT
LIGASURE IMPACT 36 18CM CVD LR (INSTRUMENTS) ×2 IMPLANT
NEEDLE HYPO 22GX1.5 SAFETY (NEEDLE) ×2 IMPLANT
NS IRRIG 1000ML POUR BTL (IV SOLUTION) ×2 IMPLANT
PACK ABDOMINAL MINOR (CUSTOM PROCEDURE TRAY) ×2 IMPLANT
PROTECTOR NERVE ULNAR (MISCELLANEOUS) ×2 IMPLANT
SPONGE LAP 4X18 RFD (DISPOSABLE) IMPLANT
SUT PLAIN 2 0 (SUTURE) ×1
SUT PLAIN ABS 2-0 CT1 27XMFL (SUTURE) ×1 IMPLANT
SUT VICRYL 0 UR6 27IN ABS (SUTURE) ×2 IMPLANT
SUT VICRYL 4-0 PS2 18IN ABS (SUTURE) ×2 IMPLANT
SYR CONTROL 10ML LL (SYRINGE) ×2 IMPLANT
TOWEL OR 17X24 6PK STRL BLUE (TOWEL DISPOSABLE) ×4 IMPLANT
TRAY FOLEY W/BAG SLVR 14FR (SET/KITS/TRAYS/PACK) ×2 IMPLANT

## 2021-01-03 NOTE — Op Note (Signed)
Gloria Kemp 01/03/2021  PREOPERATIVE DIAGNOSIS:  Undesired fertility  POSTOPERATIVE DIAGNOSIS:  Undesired fertility  PROCEDURE:  Postpartum Bilateral salpingectomy using LigaSure device   SURGEON:  Dr Candelaria Celeste  ANESTHESIA:  General   COMPLICATIONS:  None immediate.  ESTIMATED BLOOD LOSS:  Less than 20cc.  FLUIDS: 200 mL LR.  URINE OUTPUT:  500 mL of clear urine.  INDICATIONS: 25 y.o. yo G2P2002  with undesired fertility,status post vaginal delivery, desires permanent sterilization. Risks and benefits of procedure discussed with patient including permanence of method, bleeding, infection, injury to surrounding organs and need for additional procedures. Risk failure of 0.5-1% with increased risk of ectopic gestation if pregnancy occurs was also discussed with patient.   FINDINGS:  Normal uterus, tubes, and ovaries.  TECHNIQUE:  The patient was taken to the operating room where she was placed under general anesthesia and found to be adequate.  She was then placed in the dorsal supine position and prepped and draped in sterile fashion.  After an adequate timeout was performed, attention was turned to the patient's abdomen where a small transverse skin incision was made under the umbilical fold. The incision was taken down to the layer of fascia using the scalpel, and fascia was incised, and extended bilaterally using Mayo scissors. The peritoneum was entered in a sharp fashion. Attention was then turned to the patient's uterus, and left fallopian tube was identified and followed out to the fimbriated end.  A LigaSure device was used via manufacturers instruction to ligate the fallopian tube from the the underlying mesosalpinx from the fimbria to approximately 2-3cm from the cornual attachment.  A similar process was carried out on the right side allowing for bilateral salpingectomy.  Good hemostasis was noted overall.  Local analgesia was injected on both operative sites.The instruments  were then removed from the patient's abdomen and the fascial incision was repaired with 0 Vicryl, subcutaneous was re-approximated with 2-0 plain gut, and the skin was closed with a 3-0 Monocryl subcuticular stitch. The patient tolerated the procedure well.  Sponge, lap, and needle counts were correct times two.  The patient was then taken to the recovery room awake, extubated and in stable condition.   Allayne Stack, DO 01/03/2021 1:00 PM

## 2021-01-03 NOTE — Anesthesia Preprocedure Evaluation (Addendum)
Anesthesia Evaluation  Patient identified by MRN, date of birth, ID band Patient awake    Reviewed: Allergy & Precautions, NPO status , Patient's Chart, lab work & pertinent test results, reviewed documented beta blocker date and time   Airway Mallampati: III  TM Distance: >3 FB Neck ROM: Full    Dental no notable dental hx. (+) Teeth Intact, Dental Advisory Given   Pulmonary neg pulmonary ROS,    Pulmonary exam normal breath sounds clear to auscultation       Cardiovascular hypertension, Pt. on medications and Pt. on home beta blockers Normal cardiovascular exam Rhythm:Regular Rate:Normal     Neuro/Psych negative neurological ROS  negative psych ROS   GI/Hepatic negative GI ROS, Neg liver ROS,   Endo/Other  diabetes (diet controlled), Well Controlled, GestationalObesity BMI 37  Renal/GU negative Renal ROS  negative genitourinary   Musculoskeletal negative musculoskeletal ROS (+)   Abdominal (+) + obese,   Peds negative pediatric ROS (+)  Hematology  (+) Blood dyscrasia, anemia , hct 35.5, plt 206   Anesthesia Other Findings   Reproductive/Obstetrics Desires sterility                            Anesthesia Physical Anesthesia Plan  ASA: 3  Anesthesia Plan: General   Post-op Pain Management:    Induction: Intravenous  PONV Risk Score and Plan: 3 and Ondansetron and Dexamethasone  Airway Management Planned: Oral ETT  Additional Equipment: None  Intra-op Plan:   Post-operative Plan: Extubation in OR  Informed Consent: I have reviewed the patients History and Physical, chart, labs and discussed the procedure including the risks, benefits and alternatives for the proposed anesthesia with the patient or authorized representative who has indicated his/her understanding and acceptance.     Dental advisory given  Plan Discussed with: CRNA  Anesthesia Plan Comments: (Pt preference  for general anesthesia )      Anesthesia Quick Evaluation

## 2021-01-03 NOTE — Anesthesia Postprocedure Evaluation (Signed)
Anesthesia Post Note  Patient: Gloria Kemp  Procedure(s) Performed: POST PARTUM TUBAL LIGATION (Bilateral)     Patient location during evaluation: PACU Anesthesia Type: General Level of consciousness: awake and alert, oriented and patient cooperative Pain management: pain level controlled Vital Signs Assessment: post-procedure vital signs reviewed and stable Respiratory status: spontaneous breathing, nonlabored ventilation and respiratory function stable Cardiovascular status: blood pressure returned to baseline and stable Postop Assessment: no apparent nausea or vomiting Anesthetic complications: no   No notable events documented.  Last Vitals:  Vitals:   01/03/21 1140 01/03/21 1145  BP: (!) 144/76 134/86  Pulse: (!) 105 (!) 105  Resp: (!) 21 17  Temp: 36.8 C   SpO2: 100% 100%    Last Pain:  Vitals:   01/03/21 1140  TempSrc:   PainSc: 1    Pain Goal:                   Lannie Fields

## 2021-01-03 NOTE — Progress Notes (Signed)
Post Partum Day 1 Subjective: Patient reports doing well and is without complaints. She denies HA, visual changes, RUQ/epigastric pain, Nausea or emesis  Objective: Blood pressure (!) 168/98, pulse (!) 102, temperature 97.8 F (36.6 C), temperature source Oral, resp. rate 17, height 5\' 2"  (1.575 m), weight 92.8 kg, last menstrual period 04/04/2020, SpO2 100 %, unknown if currently breastfeeding.  Physical Exam:  General: alert, cooperative, and no distress Lochia: appropriate Uterine Fundus: firm Incision: n/a DVT Evaluation: No evidence of DVT seen on physical exam.  Recent Labs    01/02/21 0758 01/03/21 0434  HGB 12.1 11.8*  HCT 36.9 35.5*    Assessment/Plan: Patient is PPD#1  with CHTN with superimposed preeclampsia - Continue magnesium sulfate until 24 hours postpartum for seizure prophylaxis - Continue procardia for BP control - Patient desires permanent sterilization. Risks and benefits of procedure discussed with patient including permanence of method, bleeding, infection, injury to surrounding organs and need for additional procedures. Risk failure of 0.5-1% with increased risk of ectopic gestation if pregnancy occurs was also discussed with patient.      LOS: 1 day   Doylene Splinter 01/03/2021, 9:25 AM

## 2021-01-03 NOTE — Lactation Note (Signed)
This note was copied from a baby's chart. Lactation Consultation Note  Patient Name: Gloria Kemp QNVVY'X Date: 01/03/2021   Age:23 hours  LC attempted to consult with Mom, but she is in OR for BTL   Gloria Kemp 01/03/2021, 12:11 PM

## 2021-01-03 NOTE — Transfer of Care (Signed)
Immediate Anesthesia Transfer of Care Note  Patient: Gloria Kemp  Procedure(s) Performed: POST PARTUM TUBAL LIGATION (Bilateral)  Patient Location: PACU  Anesthesia Type:General  Level of Consciousness: awake, alert  and oriented  Airway & Oxygen Therapy: Patient connected to nasal cannula oxygen  Post-op Assessment: Post -op Vital signs reviewed and stable  Post vital signs: stable  Last Vitals:  Vitals Value Taken Time  BP 144/76 01/03/21 1140  Temp 36.8 C 01/03/21 1140  Pulse 105 01/03/21 1142  Resp 15 01/03/21 1142  SpO2 100 % 01/03/21 1142  Vitals shown include unvalidated device data.  Last Pain:  Vitals:   01/03/21 0822  TempSrc: Oral  PainSc: 5          Complications: No notable events documented.

## 2021-01-03 NOTE — Lactation Note (Signed)
This note was copied from a baby's chart. Lactation Consultation Note  Patient Name: [name] [id number]'P Date: 01/03/2021 Reason for consult: Follow-up assessment;Term;Maternal endocrine disorder Age:24 hours  LC in room with P2 Mom of term baby at 2% weight loss.  Mom has been exclusively formula.  Mom states she couldn't breastfeed because baby's temperature had dropped and she had to do STS.  Talked about the benefits of STS and offering the breast with cues.  Mom knows to ask her RN for assistance with breastfeeding.  DEBP set up in room.  Mom has not pumped yet.  Encouraged having baby learn to latch, but if unable to, Mom instructed to ask her RN for assistance with pumping to support her milk supply.  RN given report and asked to call LC prn for assistance.  Mom on MgSO4, and just returned from OR for BTL.  Baby getting a chest X ray due to finding on [place] prenatally.  X-ray to R/O CPAM of the lungs.  Lactation Tools Discussed/Used Tools: Pump Breast pump type: Double-Electric Breast Pump Pumping frequency: Not yet.  Mom encouraged to ask her RN for assistance with pumping Pumped volume: 0 mL  Interventions Interventions: Breast feeding basics reviewed;Skin to skin;Breast massage;Hand express;DEBP  Consult Status Consult Status: Follow-up Date: 01/04/21 Follow-up type: In-patient    Judee Clara 01/03/2021, 1:27 PM

## 2021-01-03 NOTE — Anesthesia Procedure Notes (Signed)
Procedure Name: Intubation Date/Time: 01/03/2021 10:40 AM Performed by: Lavell Luster, CRNA Pre-anesthesia Checklist: Patient identified, Emergency Drugs available, Suction available, Patient being monitored and Timeout performed Patient Re-evaluated:Patient Re-evaluated prior to induction Oxygen Delivery Method: Circle system utilized Preoxygenation: Pre-oxygenation with 100% oxygen Induction Type: IV induction and Rapid sequence Laryngoscope Size: Mac and 3 Grade View: Grade I Tube size: 7.0 mm Number of attempts: 1 Airway Equipment and Method: Stylet and Video-laryngoscopy Placement Confirmation: ETT inserted through vocal cords under direct vision, positive ETCO2 and breath sounds checked- equal and bilateral Secured at: 22 cm Tube secured with: Tape Dental Injury: Teeth and Oropharynx as per pre-operative assessment

## 2021-01-04 LAB — SURGICAL PATHOLOGY

## 2021-01-04 MED ORDER — NIFEDIPINE ER OSMOTIC RELEASE 60 MG PO TB24: 60.0000 mg | ORAL_TABLET | Freq: Every day | ORAL | 3 refills | Status: DC

## 2021-01-04 MED ORDER — IBUPROFEN 600 MG PO TABS: 600.0000 mg | ORAL_TABLET | Freq: Four times a day (QID) | ORAL | 0 refills | Status: DC

## 2021-01-04 NOTE — Plan of Care (Signed)
  Problem: Education: Goal: Knowledge of Childbirth will improve Outcome: Completed/Met Goal: Ability to make informed decisions regarding treatment and plan of care will improve Outcome: Completed/Met Goal: Ability to state and carry out methods to decrease the pain will improve Outcome: Completed/Met Goal: Individualized Educational Video(s) Outcome: Completed/Met

## 2021-01-10 ENCOUNTER — Ambulatory Visit (INDEPENDENT_AMBULATORY_CARE_PROVIDER_SITE_OTHER): Payer: Managed Care, Other (non HMO) | Admitting: Obstetrics & Gynecology

## 2021-01-10 ENCOUNTER — Other Ambulatory Visit: Payer: Self-pay

## 2021-01-10 NOTE — Progress Notes (Signed)
Pt is 1 week PP, vag delivery with BTL. Pt is taking Procardia as prescribed, pt does have elevated BP today.   EPDS score 0.

## 2021-01-10 NOTE — Progress Notes (Signed)
Subjective:     Gloria Kemp is a 25 y.o. female who presents to the clinic 1 weeks status post  BTL   for requested sterilization and postpartum . Eating a regular diet without difficulty. Bowel movements are normal. The patient is not having any pain.  The following portions of the patient's history were reviewed and updated as appropriate: allergies, current medications, past family history, past medical history, past social history, past surgical history, and problem list.  Review of Systems Pertinent items are noted in HPI.    Objective:    BP (!) 154/110   Pulse 84   Wt 189 lb (85.7 kg)   BMI 34.57 kg/m  General:  alert, cooperative, and no distress  Abdomen: soft, bowel sounds active, non-tender  Incision:   healing well, no drainage, no erythema, no hernia, no seroma, no swelling, no dehiscence, incision well approximated     Assessment:    Doing well postoperatively. Operative findings again reviewed. Pathology report discussed.   PP HTN Plan:  HTN continue procardia and f/u 4 weeks PP  1. Continue any current medications. 2. Wound care discussed. 3. Activity restrictions: no lifting more than 15 pounds 4. Anticipated return to work: 4 weeks. 5. Follow up: 4 weeks . Patient ID: Gloria Kemp, female   DOB: Aug 10, 1995, 25 y.o.   MRN: [id number]

## 2021-01-16 ENCOUNTER — Telehealth (HOSPITAL_COMMUNITY): Payer: Self-pay | Admitting: *Deleted

## 2021-01-16 NOTE — Telephone Encounter (Signed)
Hospital Discharge Follow-Up Call:  Patient reports that she is well and has no concerns about her healing process.  Patient hung-up during 1st question of EPDS - unable to complete screening.  Patient says that baby is well and she has no concerns about baby's health.  She reports that baby sleeps in a bassinet.  Reviewed ABCs of Safe Sleep.

## 2021-02-13 ENCOUNTER — Other Ambulatory Visit: Payer: Managed Care, Other (non HMO)

## 2021-02-13 ENCOUNTER — Encounter: Payer: Self-pay | Admitting: Obstetrics and Gynecology

## 2021-02-13 ENCOUNTER — Other Ambulatory Visit: Payer: Self-pay

## 2021-02-13 ENCOUNTER — Ambulatory Visit (INDEPENDENT_AMBULATORY_CARE_PROVIDER_SITE_OTHER): Payer: Managed Care, Other (non HMO) | Admitting: Obstetrics and Gynecology

## 2021-02-13 DIAGNOSIS — O119 Pre-existing hypertension with pre-eclampsia, unspecified trimester: Secondary | ICD-10-CM

## 2021-02-13 DIAGNOSIS — O115 Pre-existing hypertension with pre-eclampsia, complicating the puerperium: Secondary | ICD-10-CM | POA: Diagnosis not present

## 2021-02-13 NOTE — Progress Notes (Signed)
Post Partum Visit Note  Gloria Kemp is a 25 y.o. G60P2002 female who presents for a postpartum visit. She is 6 weeks postpartum following a normal spontaneous vaginal delivery.  I have fully reviewed the prenatal and intrapartum course. The delivery was at 39w gestational weeks.  Anesthesia: none. Postpartum course has been complicated by hypertension. Baby is doing well. Baby is feeding by bottle - Enfamil NeuroPro . Bleeding moderate lochia. Bowel function is normal. Bladder function is normal. Patient is not sexually active. Contraception method is tubal ligation. Postpartum depression screening: negative.   The pregnancy intention screening data noted above was reviewed. Potential methods of contraception were discussed. The patient elected to proceed with No data recorded.   Edinburgh Postnatal Depression Scale - 02/13/21 0818       Edinburgh Postnatal Depression Scale:  In the Past 7 Days   I have been able to laugh and see the funny side of things. 0    I have looked forward with enjoyment to things. 0    I have blamed myself unnecessarily when things went wrong. 0    I have been anxious or worried for no good reason. 0    I have felt scared or panicky for no good reason. 0    Things have been getting on top of me. 0    I have been so unhappy that I have had difficulty sleeping. 0    I have felt sad or miserable. 0    I have been so unhappy that I have been crying. 0    The thought of harming myself has occurred to me. 0    Edinburgh Postnatal Depression Scale Total 0              The following portions of the patient's history were reviewed and updated as appropriate: allergies, current medications, past family history, past medical history, past social history, past surgical history, and problem list.  Review of Systems Pertinent items noted in HPI and remainder of comprehensive ROS otherwise negative.    Objective:  BP 130/83 (BP Location: Left Arm, Patient  Position: Sitting, Cuff Size: Normal)   Pulse 98   Ht 5\' 4"  (1.626 m)   Wt 194 lb (88 kg)   Breastfeeding No   BMI 33.30 kg/m    General:  alert, cooperative, and no distress   Breasts:  inspection negative, no nipple discharge or bleeding, no masses or nodularity palpable  Lungs: clear to auscultation bilaterally  Heart:  regular rate and rhythm  Abdomen: soft, non-tender; bowel sounds normal; no masses,  no organomegaly, umbilical incision healed   Vulva:  not evaluated  Vagina: not evaluated        Assessment:    Normal postpartum exam. Pap smear not done at today's visit.   Plan:   Essential components of care per ACOG recommendations:  1.  Mood and well being: Patient with negative depression screening today. Reviewed local resources for support.  - Patient does not use tobacco.  - hx of drug use? No    2. Infant care and feeding:  -Patient currently breastmilk feeding? No  -Social determinants of health (SDOH) reviewed in EPIC. No concerns  3. Sexuality, contraception and birth spacing - Patient does not want a pregnancy in the next year.  Desired family size is 2 children.  - Reviewed forms of contraception in tiered fashion. Patient had BTL following delivery.     4. Sleep and fatigue -Encouraged family/partner/community support of  4 hrs of uninterrupted sleep to help with mood and fatigue  5. Physical Recovery  - Discussed patients delivery and complications - Patient did not have a perineal laceration, perineal healing reviewed. Patient expressed understanding - Patient has urinary incontinence? No - Patient is safe to resume physical and sexual activity  6.  Health Maintenance - Last pap smear done 09/2018 and was normal with negative HPV.   7. Chronic Disease - PCP follow up for CHTN BP well controlled on Procardia Referral to establish care with PCP placed  Catalina Antigua, MD [facility], [facility]

## 2021-02-20 ENCOUNTER — Other Ambulatory Visit: Payer: Managed Care, Other (non HMO)

## 2021-07-20 ENCOUNTER — Ambulatory Visit (INDEPENDENT_AMBULATORY_CARE_PROVIDER_SITE_OTHER): Payer: Managed Care, Other (non HMO) | Admitting: Family Medicine

## 2021-07-20 ENCOUNTER — Encounter: Payer: Self-pay | Admitting: Family Medicine

## 2021-07-20 ENCOUNTER — Ambulatory Visit (INDEPENDENT_AMBULATORY_CARE_PROVIDER_SITE_OTHER): Payer: Managed Care, Other (non HMO)

## 2021-07-20 VITALS — BP 180/106 | HR 100 | Temp 99.0°F | Ht 64.0 in | Wt 208.0 lb

## 2021-07-20 DIAGNOSIS — R7303 Prediabetes: Secondary | ICD-10-CM

## 2021-07-20 DIAGNOSIS — R809 Proteinuria, unspecified: Secondary | ICD-10-CM

## 2021-07-20 DIAGNOSIS — Z8632 Personal history of gestational diabetes: Secondary | ICD-10-CM

## 2021-07-20 DIAGNOSIS — I10 Essential (primary) hypertension: Secondary | ICD-10-CM

## 2021-07-20 DIAGNOSIS — R000 Tachycardia, unspecified: Secondary | ICD-10-CM

## 2021-07-20 DIAGNOSIS — E669 Obesity, unspecified: Secondary | ICD-10-CM

## 2021-07-20 LAB — CBC WITH DIFFERENTIAL/PLATELET
Basophils Absolute: 0 10*3/uL (ref 0.0–0.1)
Basophils Relative: 0.4 % (ref 0.0–3.0)
Eosinophils Absolute: 0.1 10*3/uL (ref 0.0–0.7)
Eosinophils Relative: 1.4 % (ref 0.0–5.0)
HCT: 35.7 % — ABNORMAL LOW (ref 36.0–46.0)
Hemoglobin: 11.6 g/dL — ABNORMAL LOW (ref 12.0–15.0)
Lymphocytes Relative: 31.5 % (ref 12.0–46.0)
Lymphs Abs: 2.5 10*3/uL (ref 0.7–4.0)
MCHC: 32.6 g/dL (ref 30.0–36.0)
MCV: 76 fl — ABNORMAL LOW (ref 78.0–100.0)
Monocytes Absolute: 0.5 10*3/uL (ref 0.1–1.0)
Monocytes Relative: 6.3 % (ref 3.0–12.0)
Neutro Abs: 4.8 10*3/uL (ref 1.4–7.7)
Neutrophils Relative %: 60.4 % (ref 43.0–77.0)
Platelets: 276 10*3/uL (ref 150.0–400.0)
RBC: 4.7 Mil/uL (ref 3.87–5.11)
RDW: 14.8 % (ref 11.5–15.5)
WBC: 7.9 10*3/uL (ref 4.0–10.5)

## 2021-07-20 LAB — COMPREHENSIVE METABOLIC PANEL
ALT: 16 U/L (ref 0–35)
AST: 18 U/L (ref 0–37)
Albumin: 4.3 g/dL (ref 3.5–5.2)
Alkaline Phosphatase: 74 U/L (ref 39–117)
BUN: 13 mg/dL (ref 6–23)
CO2: 26 mEq/L (ref 19–32)
Calcium: 9.5 mg/dL (ref 8.4–10.5)
Chloride: 104 mEq/L (ref 96–112)
Creatinine, Ser: 0.8 mg/dL (ref 0.40–1.20)
GFR: 103.03 mL/min (ref >60.00)
Glucose, Bld: 114 mg/dL — ABNORMAL HIGH (ref 70–99)
Potassium: 3.7 mEq/L (ref 3.5–5.1)
Sodium: 137 mEq/L (ref 135–145)
Total Bilirubin: 0.2 mg/dL (ref 0.2–1.2)
Total Protein: 7 g/dL (ref 6.0–8.3)

## 2021-07-20 LAB — POCT URINALYSIS DIPSTICK
Bilirubin, UA: NEGATIVE
Blood, UA: POSITIVE
Glucose, UA: NEGATIVE
Ketones, UA: NEGATIVE
Leukocytes, UA: NEGATIVE
Nitrite, UA: NEGATIVE
Protein, UA: POSITIVE — AB
Spec Grav, UA: >=1.03 — AB (ref 1.010–1.025)
Urobilinogen, UA: 0.2 E.U./dL
pH, UA: 6 (ref 5.0–8.0)

## 2021-07-20 LAB — T4, FREE: Free T4: 0.94 ng/dL (ref 0.60–1.60)

## 2021-07-20 LAB — LIPID PANEL
Cholesterol: 153 mg/dL (ref 0–200)
HDL: 52.4 mg/dL (ref >39.00)
LDL Cholesterol: 77 mg/dL (ref 0–99)
NonHDL: 100.47
Total CHOL/HDL Ratio: 3
Triglycerides: 115 mg/dL (ref 0.0–149.0)
VLDL: 23 mg/dL (ref 0.0–40.0)

## 2021-07-20 LAB — HEMOGLOBIN A1C: Hgb A1c MFr Bld: 6.2 % (ref 4.6–6.5)

## 2021-07-20 LAB — T3, FREE: T3, Free: 3.3 pg/mL (ref 2.3–4.2)

## 2021-07-20 LAB — TSH: TSH: 1.37 u[IU]/mL (ref 0.35–5.50)

## 2021-07-20 MED ORDER — AMLODIPINE BESYLATE 5 MG PO TABS
5.0000 mg | ORAL_TABLET | Freq: Every day | ORAL | 0 refills | Status: DC
Start: 2021-07-20 — End: 2021-10-26

## 2021-07-20 NOTE — Patient Instructions (Signed)
Go to the first floor for blood work and a chest x-ray before you leave today. ? ?I recommend that you cut back on your caffeine and try to cut it out over the next couple of weeks. ? ?Would also like for you to cut back on sodium.  Do not use salt on your food and avoid fast food. ? ?Start on the amlodipine blood pressure medication once daily. ? ?Keep an eye on your blood pressure at home and bring in your blood pressure machine along with the readings at your follow-up appointment. ? ?Follow-up in 2 weeks. ? ? ?DASH Eating Plan ?DASH stands for Dietary Approaches to Stop Hypertension. The DASH eating plan is a healthy eating plan that has been shown to: ?Reduce high blood pressure (hypertension). ?Reduce your risk for type 2 diabetes, heart disease, and stroke. ?Help with weight loss. ?What are tips for following this plan? ?Reading food labels ?Check food labels for the amount of salt (sodium) per serving. Choose foods with less than 5 percent of the Daily Value of sodium. Generally, foods with less than 300 milligrams (mg) of sodium per serving fit into this eating plan. ?To find whole grains, look for the word "whole" as the first word in the ingredient list. ?Shopping ?Buy products labeled as "low-sodium" or "no salt added." ?Buy fresh foods. Avoid canned foods and pre-made or frozen meals. ?Cooking ?Avoid adding salt when cooking. Use salt-free seasonings or herbs instead of table salt or sea salt. Check with your health care provider or pharmacist before using salt substitutes. ?Do not fry foods. Cook foods using healthy methods such as baking, boiling, grilling, roasting, and broiling instead. ?Cook with heart-healthy oils, such as olive, canola, avocado, soybean, or sunflower oil. ?Meal planning ? ?Eat a balanced diet that includes: ?4 or more servings of fruits and 4 or more servings of vegetables each day. Try to fill one-half of your plate with fruits and vegetables. ?6-8 servings of whole grains each  day. ?Less than 6 oz (170 g) of lean meat, poultry, or fish each day. A 3-oz (85-g) serving of meat is about the same size as a deck of cards. One egg equals 1 oz (28 g). ?2-3 servings of low-fat dairy each day. One serving is 1 cup (237 mL). ?1 serving of nuts, seeds, or beans 5 times each week. ?2-3 servings of heart-healthy fats. Healthy fats called omega-3 fatty acids are found in foods such as walnuts, flaxseeds, fortified milks, and eggs. These fats are also found in cold-water fish, such as sardines, salmon, and mackerel. ?Limit how much you eat of: ?Canned or prepackaged foods. ?Food that is high in trans fat, such as some fried foods. ?Food that is high in saturated fat, such as fatty meat. ?Desserts and other sweets, sugary drinks, and other foods with added sugar. ?Full-fat dairy products. ?Do not salt foods before eating. ?Do not eat more than 4 egg yolks a week. ?Try to eat at least 2 vegetarian meals a week. ?Eat more home-cooked food and less restaurant, buffet, and fast food. ?Lifestyle ?When eating at a restaurant, ask that your food be prepared with less salt or no salt, if possible. ?If you drink alcohol: ?Limit how much you use to: ?0-1 drink a day for women who are not pregnant. ?0-2 drinks a day for men. ?Be aware of how much alcohol is in your drink. In the [place] one drink equals one 12 oz bottle of beer (355 mL), one 5 oz glass of  wine (148 mL), or one 1? oz glass of hard liquor (44 mL). ?General information ?Avoid eating more than 2,300 mg of salt a day. If you have hypertension, you may need to reduce your sodium intake to 1,500 mg a day. ?Work with your health care provider to maintain a healthy body weight or to lose weight. Ask what an ideal weight is for you. ?Get at least 30 minutes of exercise that causes your heart to beat faster (aerobic exercise) most days of the week. Activities may include walking, swimming, or biking. ?Work with your health care provider or dietitian to adjust  your eating plan to your individual calorie needs. ?What foods should I eat? ?Fruits ?All fresh, dried, or frozen fruit. Canned fruit in natural juice (without added sugar). ?Vegetables ?Fresh or frozen vegetables (raw, steamed, roasted, or grilled). Low-sodium or reduced-sodium tomato and vegetable juice. Low-sodium or reduced-sodium tomato sauce and tomato paste. Low-sodium or reduced-sodium canned vegetables. ?Grains ?Whole-grain or whole-wheat bread. Whole-grain or whole-wheat pasta. Brown rice. Orpah Cobb. Bulgur. Whole-grain and low-sodium cereals. Pita bread. Low-fat, low-sodium crackers. Whole-wheat flour tortillas. ?Meats and other proteins ?Skinless chicken or [place]. Ground chicken or [place]. Pork with fat trimmed off. Fish and seafood. Egg whites. Dried beans, peas, or lentils. Unsalted nuts, nut butters, and seeds. Unsalted canned beans. Lean cuts of beef with fat trimmed off. Low-sodium, lean precooked or cured meat, such as sausages or meat loaves. ?Dairy ?Low-fat (1%) or fat-free (skim) milk. Reduced-fat, low-fat, or fat-free cheeses. Nonfat, low-sodium ricotta or cottage cheese. Low-fat or nonfat yogurt. Low-fat, low-sodium cheese. ?Fats and oils ?Soft margarine without trans fats. Vegetable oil. Reduced-fat, low-fat, or light mayonnaise and salad dressings (reduced-sodium). Canola, safflower, olive, avocado, soybean, and sunflower oils. Avocado. ?Seasonings and condiments ?Herbs. Spices. Seasoning mixes without salt. ?Other foods ?Unsalted popcorn and pretzels. Fat-free sweets. ?The items listed above may not be a complete list of foods and beverages you can eat. Contact a dietitian for more information. ?What foods should I avoid? ?Fruits ?Canned fruit in a light or heavy syrup. Fried fruit. Fruit in cream or butter sauce. ?Vegetables ?Creamed or fried vegetables. Vegetables in a cheese sauce. Regular canned vegetables (not low-sodium or reduced-sodium). Regular canned tomato sauce and paste  (not low-sodium or reduced-sodium). Regular tomato and vegetable juice (not low-sodium or reduced-sodium). Rosita Fire. Olives. ?Grains ?Baked goods made with fat, such as croissants, muffins, or some breads. Dry pasta or rice meal packs. ?Meats and other proteins ?Fatty cuts of meat. Ribs. Fried meat. Tomasa Blase. Bologna, salami, and other precooked or cured meats, such as sausages or meat loaves. Fat from the back of a pig (fatback). Bratwurst. Salted nuts and seeds. Canned beans with added salt. Canned or smoked fish. Whole eggs or egg yolks. Chicken or [place] with skin. ?Dairy ?Whole or 2% milk, cream, and half-and-half. Whole or full-fat cream cheese. Whole-fat or sweetened yogurt. Full-fat cheese. Nondairy creamers. Whipped toppings. Processed cheese and cheese spreads. ?Fats and oils ?Butter. Stick margarine. Lard. Shortening. Ghee. Bacon fat. Tropical oils, such as coconut, palm kernel, or palm oil. ?Seasonings and condiments ?Onion salt, garlic salt, seasoned salt, table salt, and sea salt. Worcestershire sauce. Tartar sauce. Barbecue sauce. Teriyaki sauce. Soy sauce, including reduced-sodium. Steak sauce. Canned and packaged gravies. Fish sauce. Oyster sauce. Cocktail sauce. Store-bought horseradish. Ketchup. Mustard. Meat flavorings and tenderizers. Bouillon cubes. Hot sauces. Pre-made or packaged marinades. Pre-made or packaged taco seasonings. Relishes. Regular salad dressings. ?Other foods ?Salted popcorn and pretzels. ?The items listed above may not  be a complete list of foods and beverages you should avoid. Contact a dietitian for more information. ?Where to find more information ?National Heart, Lung, and Blood Institute: PopSteam.is ?American Heart Association: www.heart.org ?Academy of Nutrition and Dietetics: www.eatright.org ?National Kidney Foundation: www.kidney.org ?Summary ?The DASH eating plan is a healthy eating plan that has been shown to reduce high blood pressure (hypertension). It may  also reduce your risk for type 2 diabetes, heart disease, and stroke. ?When on the DASH eating plan, aim to eat more fresh fruits and vegetables, whole grains, lean proteins, low-fat dairy, and heart-heal

## 2021-07-20 NOTE — Progress Notes (Signed)
? ?New Patient Office Visit ? ?Subjective   ? ?Patient ID: Gloria SheehanRashauna Kemp, female    DOB: 03/18/1996  Age: 26 y.o. MRN: [id number] ? ?CC:  ?Chief Complaint  ?Patient presents with  ? Establish Care  ?  BP has been high since she has had her 1st child and would like to discuss medication.  ? ? ?HPI ?Winnifred FriarRashauna Salomon FickBanks presents to establish care.  ? ?She has been seeing OB/GYN in the past, no recent PCP.  ? ?States her main concern today is elevated BP.  States she drank a Mtn. Dew prior to arrival today. She does report a diet fairly high in sodium.  ? ?States she was taking Procardia in the past but has not been taking it since October 2022.  ? ?Reports history of HTN in HS. Last took BP med October 2022.  ?Mother has HTN.  ? ?HTN was worse during pregnancy.  ?She had to deliver 3 weeks early because of her BP. Denies pre-eclampsia.  ? ?Gestational diabetes.  ? ?She had a tubal ligation.  ? ?She is single.  ?Does not smoke or use drugs.  ? ?Mother with thyroid removed.  ? ?Denies fever, chills, dizziness, chest pain, palpitations, shortness of breath, abdominal pain, N/V/D, urinary symptoms, LE edema.  ? ? ? ? ?Outpatient Encounter Medications as of 07/20/2021  ?Medication Sig  ? amLODipine (NORVASC) 5 MG tablet Take 1 tablet (5 mg total) by mouth daily.  ? [DISCONTINUED] ibuprofen (ADVIL) 600 MG tablet Take 1 tablet (600 mg total) by mouth every 6 (six) hours. (Patient not taking: Reported on 02/13/2021)  ? [DISCONTINUED] NIFEdipine (PROCARDIA XL/NIFEDICAL XL) 60 MG 24 hr tablet Take 1 tablet (60 mg total) by mouth daily.  ? [DISCONTINUED] NIFEdipine (PROCARDIA XL/NIFEDICAL XL) 60 MG 24 hr tablet Take 1 tablet (60 mg total) by mouth daily. Can increase to twice a day as needed for symptomatic contractions  ? [DISCONTINUED] Prenatal Vit-Fe Fumarate-FA (PRENATAL VITAMIN PO) Take by mouth. (Patient not taking: Reported on 02/13/2021)  ? ?No facility-administered encounter medications on file as of 07/20/2021.  ? ? ?Past  Medical History:  ?Diagnosis Date  ? Gestational diabetes 10/27/2018  ? Possible VSD seen on fetal echo.  Current Diabetic Medications:  None  [x]  Aspirin 81 mg daily after 12 weeks (? A2/B GDM)  Required Referrals for A1GDM or A2GDM: [x]  Diabetes Education and Testing Supplies [x]  Nutrition Cousult  For A2/B GDM or higher classes of DM [ ]  Diabetes Education and Testing Supplies [ ]  Nutrition Counsult [ ]  Fetal ECHO after 20 weeks  [ ]  Eye exam for retina evaluation   ? Hypertension   ? IUGR (intrauterine growth restriction) affecting care of mother 02/12/2019  ? 02/11/19: 8th%tile    Follow up UA dopplers in 2 weeks  NST in 1 week  Repeat growth in 3-4 weeks.  Initiate BPP at 32 weeks.  Guidelines for Antenatal Testing and Sonography  (with updated ICD-10 codes)  Updated 10/16/2018 with Dr. Noralee Spaceavi Shankar  IUGR- O36.5990    EFW < 10%, nml Dopplers & AFV, AC<3%, - other comorbidities        EFW < 10% w/ AEDF & low AFV or EFW < 3%   Q 3-4wks   As per MFM  At d  ? Supervision of normal first pregnancy 09/16/2018  ?  Nursing Staff Provider Office Location  [facility] Dating  LMP Language   English Anatomy US  Normal, but possible VSD on echo, repeat echo normal Flu  Vaccine  Declined 12-11-18 Genetic Screen  NIPS:  Insufficient fetal fraction, declined testing  TDaP vaccine   Declined 03/06/19 Hgb A1C or  GTT Early: 5.7 Third trimester Failed 2 hr on 10/24/18 Rhogam   n/a   LAB RESULTS  Feeding Plan  Breastfeed   ? ? ?Past Surgical History:  ?Procedure Laterality Date  ? ADENOIDECTOMY    ? EYE SURGERY    ? TUBAL LIGATION Bilateral 01/03/2021  ? Procedure: POST PARTUM TUBAL LIGATION;  Surgeon: Levie Heritage, DO;  Location: [facility];  Service: Gynecology;  Laterality: Bilateral;  ? ? ?Family History  ?Problem Relation Age of Onset  ? Hypertension Mother   ? Obesity Mother   ? Diabetes Father   ? Hearing loss Father   ? Hypertension Father   ? Obesity Father   ? Stroke Paternal Aunt   ? Kidney disease Paternal Uncle   ?  Hypertension Maternal Grandmother   ? Obesity Maternal Grandmother   ? Cancer Maternal Grandfather   ? Arthritis Paternal Grandmother   ? Diabetes Paternal Grandmother   ? Hypertension Paternal Grandmother   ? Miscarriages / Stillbirths Paternal Grandmother   ? Obesity Paternal Grandmother   ? ? ?Social History  ? ?Socioeconomic History  ? Marital status: Single  ?  Spouse name: Not on file  ? Number of children: 0  ? Years of education: Not on file  ? Highest education level: Not on file  ?Occupational History  ? Occupation: [employer]  ?Tobacco Use  ? Smoking status: Never  ? Smokeless tobacco: Never  ?Vaping Use  ? Vaping Use: Never used  ?Substance and Sexual Activity  ? Alcohol use: Not Currently  ?  Comment: not since confirmed pregnancy  ? Drug use: Never  ? Sexual activity: Yes  ?  Birth control/protection: None  ?Other Topics Concern  ? Not on file  ?Social History Narrative  ? Not on file  ? ?Social Determinants of Health  ? ?Financial Resource Strain: Not on file  ?Food Insecurity: Not on file  ?Transportation Needs: Not on file  ?Physical Activity: Not on file  ?Stress: Not on file  ?Social Connections: Not on file  ?Intimate Partner Violence: Not on file  ? ? ?ROS ?Pertinent positives and negatives in the history of present illness. ? ?  ? ? ?Objective   ? ?BP (!) 180/106 (BP Location: Left Arm, Patient Position: Sitting, Cuff Size: Large)   Pulse 100   Temp 99 ?F (37.2 ?C) (Temporal)   Ht 5\' 4"  (1.626 m)   Wt 208 lb (94.3 kg)   LMP 07/17/2021   SpO2 98%   BMI 35.70 kg/m?  ? ?Physical Exam ? ?Last CBC ?Lab Results  ?Component Value Date  ? WBC 7.9 07/20/2021  ? HGB 11.6 (L) 07/20/2021  ? HCT 35.7 (L) 07/20/2021  ? MCV 76.0 (L) 07/20/2021  ? MCH 26.0 01/03/2021  ? RDW 14.8 07/20/2021  ? PLT 276.0 07/20/2021  ? ?Last metabolic panel ?Lab Results  ?Component Value Date  ? GLUCOSE 114 (H) 07/20/2021  ? NA 137 07/20/2021  ? K 3.7 07/20/2021  ? CL 104 07/20/2021  ? CO2 26 07/20/2021  ? BUN 13 07/20/2021   ? CREATININE 0.80 07/20/2021  ? GFRNONAA >60 01/03/2021  ? CALCIUM 9.5 07/20/2021  ? PROT 7.0 07/20/2021  ? ALBUMIN 4.3 07/20/2021  ? LABGLOB 2.8 06/28/2020  ? AGRATIO 1.5 06/28/2020  ? BILITOT 0.2 07/20/2021  ? ALKPHOS 74 07/20/2021  ? AST  18 07/20/2021  ? ALT 16 07/20/2021  ? ANIONGAP 10 01/03/2021  ? ?Last lipids ?Lab Results  ?Component Value Date  ? CHOL 153 07/20/2021  ? HDL 52.40 07/20/2021  ? LDLCALC 77 07/20/2021  ? TRIG 115.0 07/20/2021  ? CHOLHDL 3 07/20/2021  ? ?Last hemoglobin A1c ?Lab Results  ?Component Value Date  ? HGBA1C 6.2 07/20/2021  ? ?Last thyroid functions ?Lab Results  ?Component Value Date  ? TSH 1.37 07/20/2021  ? ?Last vitamin D ?No results found for: 25OHVITD2, 25OHVITD3, VD25OH ?Last vitamin B12 and Folate ?No results found for: VITAMINB12, FOLATE ?  ?  ? ?Assessment & Plan:  ? ?Problem List Items Addressed This Visit   ? ?  ? Cardiovascular and Mediastinum  ? Uncontrolled hypertension - Primary  ?  Long history of HTN per patient. No mediation since 12/2020. Drank caffeine today prior to arrival. Counseling done on potential long term health consequences related to uncontrolled HTN.  ?Encouraged her to cut back on caffeine and sodium.  ?She has had a tubal ligation.  ?Start on amlodipine.  ?Check labs and chest XR. EKG NSR, unchanged from previous  ?Protein in urine ?Follow up with me in 2 weeks.  ? ?  ?  ? Relevant Medications  ? amLODipine (NORVASC) 5 MG tablet  ? Other Relevant Orders  ? CBC with Differential/Platelet (Completed)  ? Comprehensive metabolic panel (Completed)  ? EKG 12-Lead  ? DG Chest 2 View (Completed)  ?  ? Other  ? History of gestational diabetes  ? Relevant Orders  ? Hemoglobin A1c (Completed)  ? POCT urinalysis dipstick (Completed)  ? Obesity (BMI 30-39.9)  ? Relevant Orders  ? Lipid panel (Completed)  ? Prediabetes  ?  Recommend cutting back on sugar and carbohydrates. Check A1c and follow up ? ?  ?  ? Relevant Orders  ? Hemoglobin A1c (Completed)  ? Lipid  panel (Completed)  ? POCT urinalysis dipstick (Completed)  ? Proteinuria  ?  Will need further evaluation. Discussed importance of good BP control for renal health.  ? ?  ?  ? Tachycardia with hypertension  ?  She had

## 2021-07-23 NOTE — Assessment & Plan Note (Signed)
Long history of HTN per patient. No mediation since 12/2020. Drank caffeine today prior to arrival. Counseling done on potential long term health consequences related to uncontrolled HTN.  ?Encouraged her to cut back on caffeine and sodium.  ?She has had a tubal ligation.  ?Start on amlodipine.  ?Check labs and chest XR. EKG NSR, unchanged from previous  ?Protein in urine ?Follow up with me in 2 weeks.  ?

## 2021-07-23 NOTE — Assessment & Plan Note (Signed)
She had a Mtn Dew prior to arrival. Pulse did improve during visit. Advised her to cut back on caffeine. Follow up in 2 weeks.  ?

## 2021-07-23 NOTE — Assessment & Plan Note (Signed)
Will need further evaluation. Discussed importance of good BP control for renal health.  ?

## 2021-07-23 NOTE — Assessment & Plan Note (Signed)
Recommend cutting back on sugar and carbohydrates. Check A1c and follow up ?

## 2021-08-03 ENCOUNTER — Ambulatory Visit: Payer: Managed Care, Other (non HMO) | Admitting: Family Medicine

## 2021-09-07 ENCOUNTER — Ambulatory Visit
Admission: RE | Admit: 2021-09-07 | Discharge: 2021-09-07 | Disposition: A | Discharge status: Home / Self Care | Payer: Managed Care, Other (non HMO) | Source: Ambulatory Visit | Attending: Family Medicine | Admitting: Family Medicine

## 2021-09-07 VITALS — BP 154/100 | HR 85 | Temp 98.1°F | Resp 16

## 2021-09-07 DIAGNOSIS — H60502 Unspecified acute noninfective otitis externa, left ear: Secondary | ICD-10-CM

## 2021-09-07 MED ORDER — IBUPROFEN 800 MG PO TABS: 800.0000 mg | ORAL_TABLET | Freq: Three times a day (TID) | ORAL | 0 refills | Status: DC | PRN

## 2021-09-07 MED ORDER — NEOMYCIN-POLYMYXIN-HC 3.5-10000-1 OT SOLN: 4.0000 [drp] | Freq: Four times a day (QID) | OTIC | 0 refills | Status: AC

## 2021-09-07 NOTE — ED Triage Notes (Signed)
Patient presents to Urgent Care with complaints of Right sided otalgia since 2 days ago. Patient reports motrin for pain, and some decreased hearing

## 2021-09-07 NOTE — ED Provider Notes (Signed)
[name]    CSN: [id number] Arrival date & time: 09/07/21  0837      History   Chief Complaint Chief Complaint  Patient presents with   Otalgia    HPI Gloria Kemp is a 26 y.o. female.    Otalgia  Here for left ear pain that began 2 days ago.  It does hurt to lie down on it, and it is itching.  The pain is coming and going.  No fever and no upper respiratory symptoms.    Past Medical History:  Diagnosis Date   Gestational diabetes 10/27/2018   Possible VSD seen on fetal echo.  Current Diabetic Medications:  None  [x]  Aspirin 81 mg daily after 12 weeks (? A2/B GDM)  Required Referrals for A1GDM or A2GDM: [x]  Diabetes Education and Testing Supplies [x]  Nutrition Cousult  For A2/B GDM or higher classes of DM [ ]  Diabetes Education and Testing Supplies [ ]  Nutrition Counsult [ ]  Fetal ECHO after 20 weeks  [ ]  Eye exam for retina evaluation    Hypertension    IUGR (intrauterine growth restriction) affecting care of mother 02/12/2019   02/11/19: 8th%tile    Follow up UA dopplers in 2 weeks  NST in 1 week  Repeat growth in 3-4 weeks.  Initiate BPP at 32 weeks.  Guidelines for Antenatal Testing and Sonography  (with updated ICD-10 codes)  Updated Oct 24, 2018 with Dr.  IUGR- [phone]    EFW < 10%, nml Dopplers & AFV, AC<3%, - other comorbidities        EFW < 10% w/ AEDF & low AFV or EFW < 3%   Q 3-4wks   As per MFM  At d   Supervision of normal first pregnancy 09/16/2018    Nursing Staff Provider Office Location  [facility] Dating  LMP Language   English Anatomy  Normal, but possible VSD on echo, repeat echo normal Flu Vaccine  Declined 12-11-18 Genetic Screen  NIPS:  Insufficient fetal fraction, declined testing  TDaP vaccine   Declined 03/06/19 Hgb A1C or  GTT Early: 5.7 Third trimester Failed 2 hr on 10/24/18 Rhogam   n/a   LAB RESULTS  Feeding Plan  Breastfeed     Patient Active Problem List   Diagnosis Date Noted   Proteinuria 07/20/2021   Tachycardia  with hypertension 07/20/2021   Uncontrolled hypertension 07/20/2021   Obesity (BMI 30-39.9) 07/20/2021   SVD (spontaneous vaginal delivery) 01/02/2021   Chronic hypertension with superimposed pre-eclampsia 01/02/2021   Prediabetes 08/08/2020   Child with VSD (ventricular septal defect) 08/08/2020   History of gestational diabetes 06/28/2020   History of prior pregnancy with IUGR newborn 06/28/2020   Alpha thalassemia silent carrier Oct 24, 2018    Past Surgical History:  Procedure Laterality Date   ADENOIDECTOMY     EYE SURGERY     TUBAL LIGATION Bilateral 01/03/2021   Procedure: POST PARTUM TUBAL LIGATION;  Surgeon: 03/04/2021, DO;  Location: [facility];  Service: Gynecology;  Laterality: Bilateral;    OB History     Gravida  2   Para  2   Term  2   Preterm      AB      Living  2      SAB      IAB      Ectopic      Multiple  0   Live Births  2            Home  Medications    Prior to Admission medications   Medication Sig Start Date End Date Taking? Authorizing Provider  ibuprofen (ADVIL) 800 MG tablet Take 1 tablet (800 mg total) by mouth every 8 (eight) hours as needed (pain). 09/07/21  Yes Mayetta Castleman, Janace Aris, MD  neomycin-polymyxin-hydrocortisone (CORTISPORIN) OTIC solution Place 4 drops into the right ear 4 (four) times daily for 7 days. 09/07/21 09/14/21 Yes Garlon Tuggle, Janace Aris, MD  amLODipine (NORVASC) 5 MG tablet Take 1 tablet (5 mg total) by mouth daily. 07/20/21   Avanell Shackleton, NP-C    Family History Family History  Problem Relation Age of Onset   Hypertension Mother    Obesity Mother    Diabetes Father    Hearing loss Father    Hypertension Father    Obesity Father    Stroke Paternal Aunt    Kidney disease Paternal Uncle    Hypertension Maternal Grandmother    Obesity Maternal Grandmother    Cancer Maternal Grandfather    Arthritis Paternal Grandmother    Diabetes Paternal Grandmother    Hypertension Paternal Grandmother     Miscarriages / Stillbirths Paternal Grandmother    Obesity Paternal Grandmother     Social History Social History   Tobacco Use   Smoking status: Never   Smokeless tobacco: Never  Vaping Use   Vaping Use: Never used  Substance Use Topics   Alcohol use: Not Currently    Comment: occ   Drug use: Never     Allergies   Shellfish allergy   Review of Systems Review of Systems  HENT:  Positive for ear pain.      Physical Exam Triage Vital Signs ED Triage Vitals [09/07/21 0843]  Enc Vitals Group     BP (!) 154/100     Pulse Rate 85     Resp 16     Temp 98.1 F (36.7 C)     Temp Source Oral     SpO2 99 %     Weight      Height      Head Circumference      Peak Flow      Pain Score      Pain Loc      Pain Edu?      Excl. in GC?    No data found.  Updated Vital Signs BP (!) 154/100 (BP Location: Left Arm)   Pulse 85   Temp 98.1 F (36.7 C) (Oral)   Resp 16   LMP 09/06/2021 (Exact Date)   SpO2 99%   Visual Acuity Right Eye Distance:   Left Eye Distance:   Bilateral Distance:    Right Eye Near:   Left Eye Near:    Bilateral Near:     Physical Exam Vitals reviewed.  Constitutional:      General: She is not in acute distress.    Appearance: She is not ill-appearing, toxic-appearing or diaphoretic.  HENT:     Right Ear: Tympanic membrane and ear canal normal.     Left Ear: Tympanic membrane normal.     Ears:     Comments: The tympanic membrane on both sides are gray and dull, her left canal shows some slight white discharge in the anterior wall.    Nose: Nose normal.     Mouth/Throat:     Mouth: Mucous membranes are moist.     Pharynx: No oropharyngeal exudate or posterior oropharyngeal erythema.  Eyes:     Extraocular Movements: Extraocular movements intact.  Conjunctiva/sclera: Conjunctivae normal.     Pupils: Pupils are equal, round, and reactive to light.  Cardiovascular:     Rate and Rhythm: Normal rate and regular rhythm.     Heart  sounds: No murmur heard. Pulmonary:     Effort: Pulmonary effort is normal.     Breath sounds: Normal breath sounds.  Musculoskeletal:     Cervical back: Neck supple.  Lymphadenopathy:     Cervical: No cervical adenopathy.  Skin:    Coloration: Skin is not jaundiced or pale.  Neurological:     General: No focal deficit present.     Mental Status: She is alert and oriented to person, place, and time.  Psychiatric:        Behavior: Behavior normal.      UC Treatments / Results  Labs (all labs ordered are listed, but only abnormal results are displayed) Labs Reviewed - No data to display  EKG   Radiology No results found.  Procedures Procedures (including critical care time)  Medications Ordered in UC Medications - No data to display  Initial Impression / Assessment and Plan / UC Course  I have reviewed the triage vital signs and the nursing notes.  Pertinent labs & imaging results that were available during my care of the patient were reviewed by me and considered in my medical decision making (see chart for details).     We will treat for otitis externa Final Clinical Impressions(s) / UC Diagnoses   Final diagnoses:  Acute otitis externa of left ear, unspecified type     Discharge Instructions      Use Cortisporin eardrops in the left ear 4 times daily for 7 days  Take ibuprofen 800 mg--1 tab every 8 hours as needed for pain.       ED Prescriptions     Medication Sig Dispense Auth. Provider   neomycin-polymyxin-hydrocortisone (CORTISPORIN) OTIC solution Place 4 drops into the right ear 4 (four) times daily for 7 days. 10 mL Barrett Henle, MD   ibuprofen (ADVIL) 800 MG tablet Take 1 tablet (800 mg total) by mouth every 8 (eight) hours as needed (pain). 21 tablet Djuna Frechette, Gwenlyn Perking, MD      PDMP not reviewed this encounter.   Barrett Henle, MD 09/07/21 [phone]

## 2021-09-07 NOTE — Discharge Instructions (Addendum)
Use Cortisporin eardrops in the left ear 4 times daily for 7 days  Take ibuprofen 800 mg--1 tab every 8 hours as needed for pain.

## 2021-10-26 ENCOUNTER — Other Ambulatory Visit: Payer: Self-pay | Admitting: Family Medicine

## 2021-10-26 DIAGNOSIS — I10 Essential (primary) hypertension: Secondary | ICD-10-CM

## 2021-11-09 ENCOUNTER — Other Ambulatory Visit: Payer: Self-pay | Admitting: Family Medicine

## 2021-11-09 DIAGNOSIS — I10 Essential (primary) hypertension: Secondary | ICD-10-CM

## 2022-08-02 ENCOUNTER — Ambulatory Visit
Admission: EM | Admit: 2022-08-02 | Discharge: 2022-08-02 | Disposition: A | Discharge status: Home / Self Care | Payer: Managed Care, Other (non HMO) | Attending: Internal Medicine | Admitting: Internal Medicine

## 2022-08-02 DIAGNOSIS — H6593 Unspecified nonsuppurative otitis media, bilateral: Secondary | ICD-10-CM | POA: Diagnosis present

## 2022-08-02 DIAGNOSIS — J029 Acute pharyngitis, unspecified: Secondary | ICD-10-CM | POA: Insufficient documentation

## 2022-08-02 DIAGNOSIS — R0981 Nasal congestion: Secondary | ICD-10-CM | POA: Insufficient documentation

## 2022-08-02 LAB — POCT RAPID STREP A (OFFICE): Rapid Strep A Screen: NEGATIVE

## 2022-08-02 MED ORDER — CETIRIZINE HCL 10 MG PO TABS: 10.0000 mg | ORAL_TABLET | Freq: Every day | ORAL | 0 refills | Status: DC

## 2022-08-02 MED ORDER — CHLORASEPTIC 1.4 % MT LIQD: 1.0000 | OROMUCOSAL | 0 refills | Status: DC | PRN

## 2022-08-02 MED ORDER — FLUTICASONE PROPIONATE 50 MCG/ACT NA SUSP: 1.0000 | Freq: Every day | NASAL | 0 refills | Status: DC

## 2022-08-02 NOTE — ED Triage Notes (Signed)
Pt c/o throat, ear pain, pain  x 1 day pt states she last took tylenol around 9:15 pm

## 2022-08-02 NOTE — Discharge Instructions (Signed)
Rapid strep was negative.  Throat culture pending.  Will call if it is abnormal.  I have sent you 3 medications to help alleviate symptoms.  Please follow-up if any symptoms persist or worsen.

## 2022-08-02 NOTE — ED Provider Notes (Signed)
[name]    CSN: [id number] Arrival date & time: 08/02/22  0908      History   Chief Complaint Chief Complaint  Patient presents with   Sore Throat    HPI Gloria Kemp is a 27 y.o. female.   Patient presents with sore throat, ear pain, nasal congestion that started yesterday.  Reports her children have had similar symptoms.  Denies any fever or cough.  Patient took Tylenol last night for symptoms.  Denies chest pain, shortness of breath, gastrointestinal symptoms.  Denies history of asthma.  Patient's blood pressure is elevated today but she reports that she has not taken her blood pressure medication.  She takes amlodipine daily.  Patient not reporting any headache, dizziness, blurred vision.   Sore Throat    Past Medical History:  Diagnosis Date   Gestational diabetes 10/27/2018   Possible VSD seen on fetal echo.  Current Diabetic Medications:  None  [x]  Aspirin 81 mg daily after 12 weeks (? A2/B GDM)  Required Referrals for A1GDM or A2GDM: [x]  Diabetes Education and Testing Supplies [x]  Nutrition Cousult  For A2/B GDM or higher classes of DM [ ]  Diabetes Education and Testing Supplies [ ]  Nutrition Counsult [ ]  Fetal ECHO after 20 weeks  [ ]  Eye exam for retina evaluation    Hypertension    IUGR (intrauterine growth restriction) affecting care of mother 02/12/2019   02/11/19: 8th%tile    Follow up UA dopplers in 2 weeks  NST in 1 week  Repeat growth in 3-4 weeks.  Initiate BPP at 32 weeks.  Guidelines for Antenatal Testing and Sonography  (with updated ICD-10 codes)  Updated 2018-10-26 with Dr. Noralee Space  IUGR- [phone]    EFW < 10%, nml Dopplers & AFV, AC<3%, - other comorbidities        EFW < 10% w/ AEDF & low AFV or EFW < 3%   Q 3-4wks   As per MFM  At d   Supervision of normal first pregnancy 09/16/2018    Nursing Staff Provider Office Location  [facility] Dating  LMP Language   English Anatomy US  Normal, but possible VSD on echo, repeat echo normal Flu  Vaccine  Declined 12-11-18 Genetic Screen  NIPS:  Insufficient fetal fraction, declined testing  TDaP vaccine   Declined 03/06/19 Hgb A1C or  GTT Early: 5.7 Third trimester Failed 2 hr on 10/24/18 Rhogam   n/a   LAB RESULTS  Feeding Plan  Breastfeed     Patient Active Problem List   Diagnosis Date Noted   Proteinuria 07/20/2021   Tachycardia with hypertension 07/20/2021   Uncontrolled hypertension 07/20/2021   Obesity (BMI 30-39.9) 07/20/2021   SVD (spontaneous vaginal delivery) 01/02/2021   Chronic hypertension with superimposed pre-eclampsia 01/02/2021   Prediabetes 08/08/2020   Child with VSD (ventricular septal defect) 08/08/2020   History of gestational diabetes 06/28/2020   History of prior pregnancy with IUGR newborn 06/28/2020   Alpha thalassemia silent carrier 2018-10-26    Past Surgical History:  Procedure Laterality Date   ADENOIDECTOMY     EYE SURGERY     TUBAL LIGATION Bilateral 01/03/2021   Procedure: POST PARTUM TUBAL LIGATION;  Surgeon: Levie Heritage, DO;  Location: [facility];  Service: Gynecology;  Laterality: Bilateral;    OB History     Gravida  2   Para  2   Term  2   Preterm      AB      Living  2      SAB      IAB      Ectopic      Multiple  0   Live Births  2            Home Medications    Prior to Admission medications   Medication Sig Start Date End Date Taking? Authorizing Provider  cetirizine (ZYRTEC) 10 MG tablet Take 1 tablet (10 mg total) by mouth daily. 08/02/22  Yes Rubert Frediani, Rolly Salter E, FNP  fluticasone (FLONASE) 50 MCG/ACT nasal spray Place 1 spray into both nostrils daily. 08/02/22  Yes Tekeisha Hakim, Rolly Salter E, FNP  phenol (CHLORASEPTIC) 1.4 % LIQD Use as directed 1 spray in the mouth or throat as needed for throat irritation / pain. 08/02/22  Yes Gaylen Venning, Rolly Salter E, FNP  amLODipine (NORVASC) 5 MG tablet Take 1 tablet (5 mg total) by mouth daily. PLEASE SCHEDULE OFFICE VISIT FOR MORE REFILLS. 10/26/21   Hetty Blend L, NP-C  ibuprofen  (ADVIL) 800 MG tablet Take 1 tablet (800 mg total) by mouth every 8 (eight) hours as needed (pain). 09/07/21   Zenia Resides, MD    Family History Family History  Problem Relation Age of Onset   Hypertension Mother    Obesity Mother    Diabetes Father    Hearing loss Father    Hypertension Father    Obesity Father    Stroke Paternal Aunt    Kidney disease Paternal Uncle    Hypertension Maternal Grandmother    Obesity Maternal Grandmother    Cancer Maternal Grandfather    Arthritis Paternal Grandmother    Diabetes Paternal Grandmother    Hypertension Paternal Grandmother    Miscarriages / Stillbirths Paternal Grandmother    Obesity Paternal Grandmother     Social History Social History   Tobacco Use   Smoking status: Never   Smokeless tobacco: Never  Vaping Use   Vaping Use: Never used  Substance Use Topics   Alcohol use: Not Currently    Comment: occ   Drug use: Never     Allergies   Shellfish allergy   Review of Systems Review of Systems Per HPI  Physical Exam Triage Vital Signs ED Triage Vitals  Enc Vitals Group     BP 08/02/22 0937 (!) 171/99     Pulse Rate 08/02/22 0937 100     Resp 08/02/22 0937 16     Temp 08/02/22 0937 98 F (36.7 C)     Temp Source 08/02/22 0937 Oral     SpO2 08/02/22 0937 98 %     Weight --      Height --      Head Circumference --      Peak Flow --      Pain Score 08/02/22 0940 9     Pain Loc --      Pain Edu? --      Excl. in GC? --    No data found.  Updated Vital Signs BP (!) 171/99 (BP Location: Right Arm)   Pulse 100   Temp 98 F (36.7 C) (Oral)   Resp 16   LMP 07/03/2022 (Within Days)   SpO2 98%   Breastfeeding No   Visual Acuity Right Eye Distance:   Left Eye Distance:   Bilateral Distance:    Right Eye Near:   Left Eye Near:    Bilateral Near:     Physical Exam Constitutional:      General: She is not in acute distress.  Appearance: Normal appearance. She is not toxic-appearing or  diaphoretic.  HENT:     Head: Normocephalic and atraumatic.     Right Ear: Tympanic membrane and ear canal normal.     Left Ear: Tympanic membrane and ear canal normal.     Nose: Congestion present.     Mouth/Throat:     Mouth: Mucous membranes are moist.     Pharynx: Posterior oropharyngeal erythema present.  Eyes:     Extraocular Movements: Extraocular movements intact.     Conjunctiva/sclera: Conjunctivae normal.     Pupils: Pupils are equal, round, and reactive to light.  Cardiovascular:     Rate and Rhythm: Normal rate and regular rhythm.     Pulses: Normal pulses.     Heart sounds: Normal heart sounds.  Pulmonary:     Effort: Pulmonary effort is normal. No respiratory distress.     Breath sounds: Normal breath sounds. No wheezing.  Abdominal:     General: Abdomen is flat. Bowel sounds are normal.     Palpations: Abdomen is soft.  Musculoskeletal:        General: Normal range of motion.     Cervical back: Normal range of motion.  Skin:    General: Skin is warm and dry.  Neurological:     General: No focal deficit present.     Mental Status: She is alert and oriented to person, place, and time. Mental status is at baseline.  Psychiatric:        Mood and Affect: Mood normal.        Behavior: Behavior normal.      UC Treatments / Results  Labs (all labs ordered are listed, but only abnormal results are displayed) Labs Reviewed  CULTURE, GROUP A STREP [facility])  POCT RAPID STREP A (OFFICE)    EKG   Radiology No results found.  Procedures Procedures (including critical care time)  Medications Ordered in UC Medications - No data to display  Initial Impression / Assessment and Plan / UC Course  I have reviewed the triage vital signs and the nursing notes.  Pertinent labs & imaging results that were available during my care of the patient were reviewed by me and considered in my medical decision making (see chart for details).     Differential diagnoses  include viral illness versus allergic rhinitis.  Rapid strep was negative.  Throat culture pending.  Patient declined COVID testing.  Patient was sent cetirizine antihistamine, Flonase, Chloraseptic spray to help alleviate symptoms.  Patient denies that she takes any daily allergy medications so this should be safe.  Advised supportive care and symptom management.  Advised strict return precautions.  Blood pressure is elevated but patient has not had her medication today.  She is asymptomatic regarding blood pressure so do not think that emergent evaluation is necessary.  Patient encouraged to take her blood pressure medication and monitor blood pressure diligently and follow-up with PCP.  Patient verbalized understanding and was agreeable with plan. Final Clinical Impressions(s) / UC Diagnoses   Final diagnoses:  Sore throat  Fluid level behind tympanic membrane of both ears  Nasal congestion     Discharge Instructions      Rapid strep was negative.  Throat culture pending.  Will call if it is abnormal.  I have sent you 3 medications to help alleviate symptoms.  Please follow-up if any symptoms persist or worsen.    ED Prescriptions     Medication Sig Dispense Auth. Provider   cetirizine (  ZYRTEC) 10 MG tablet Take 1 tablet (10 mg total) by mouth daily. 30 tablet [place], [place] E, [place]   fluticasone [facility]) 50 MCG/ACT nasal spray Place 1 spray into both nostrils daily. 16 g Shirell Struthers, Rolly Salter E, [place]   phenol (CHLORASEPTIC) 1.4 % LIQD Use as directed 1 spray in the mouth or throat as needed for throat irritation / pain. 118 mL Gustavus Bryant, [place]      PDMP not reviewed this encounter.   Gustavus Bryant, [place] 08/02/22 1057

## 2022-08-05 LAB — CULTURE, GROUP A STREP (THRC)

## 2022-10-06 IMAGING — DX DG CHEST 2V
2 series · 2 of 2 positions shown · non-contrast
Comparison: None.

CLINICAL DATA: Hypertension and tachycardia

EXAM:
CHEST - 2 VIEW

[chest pa]
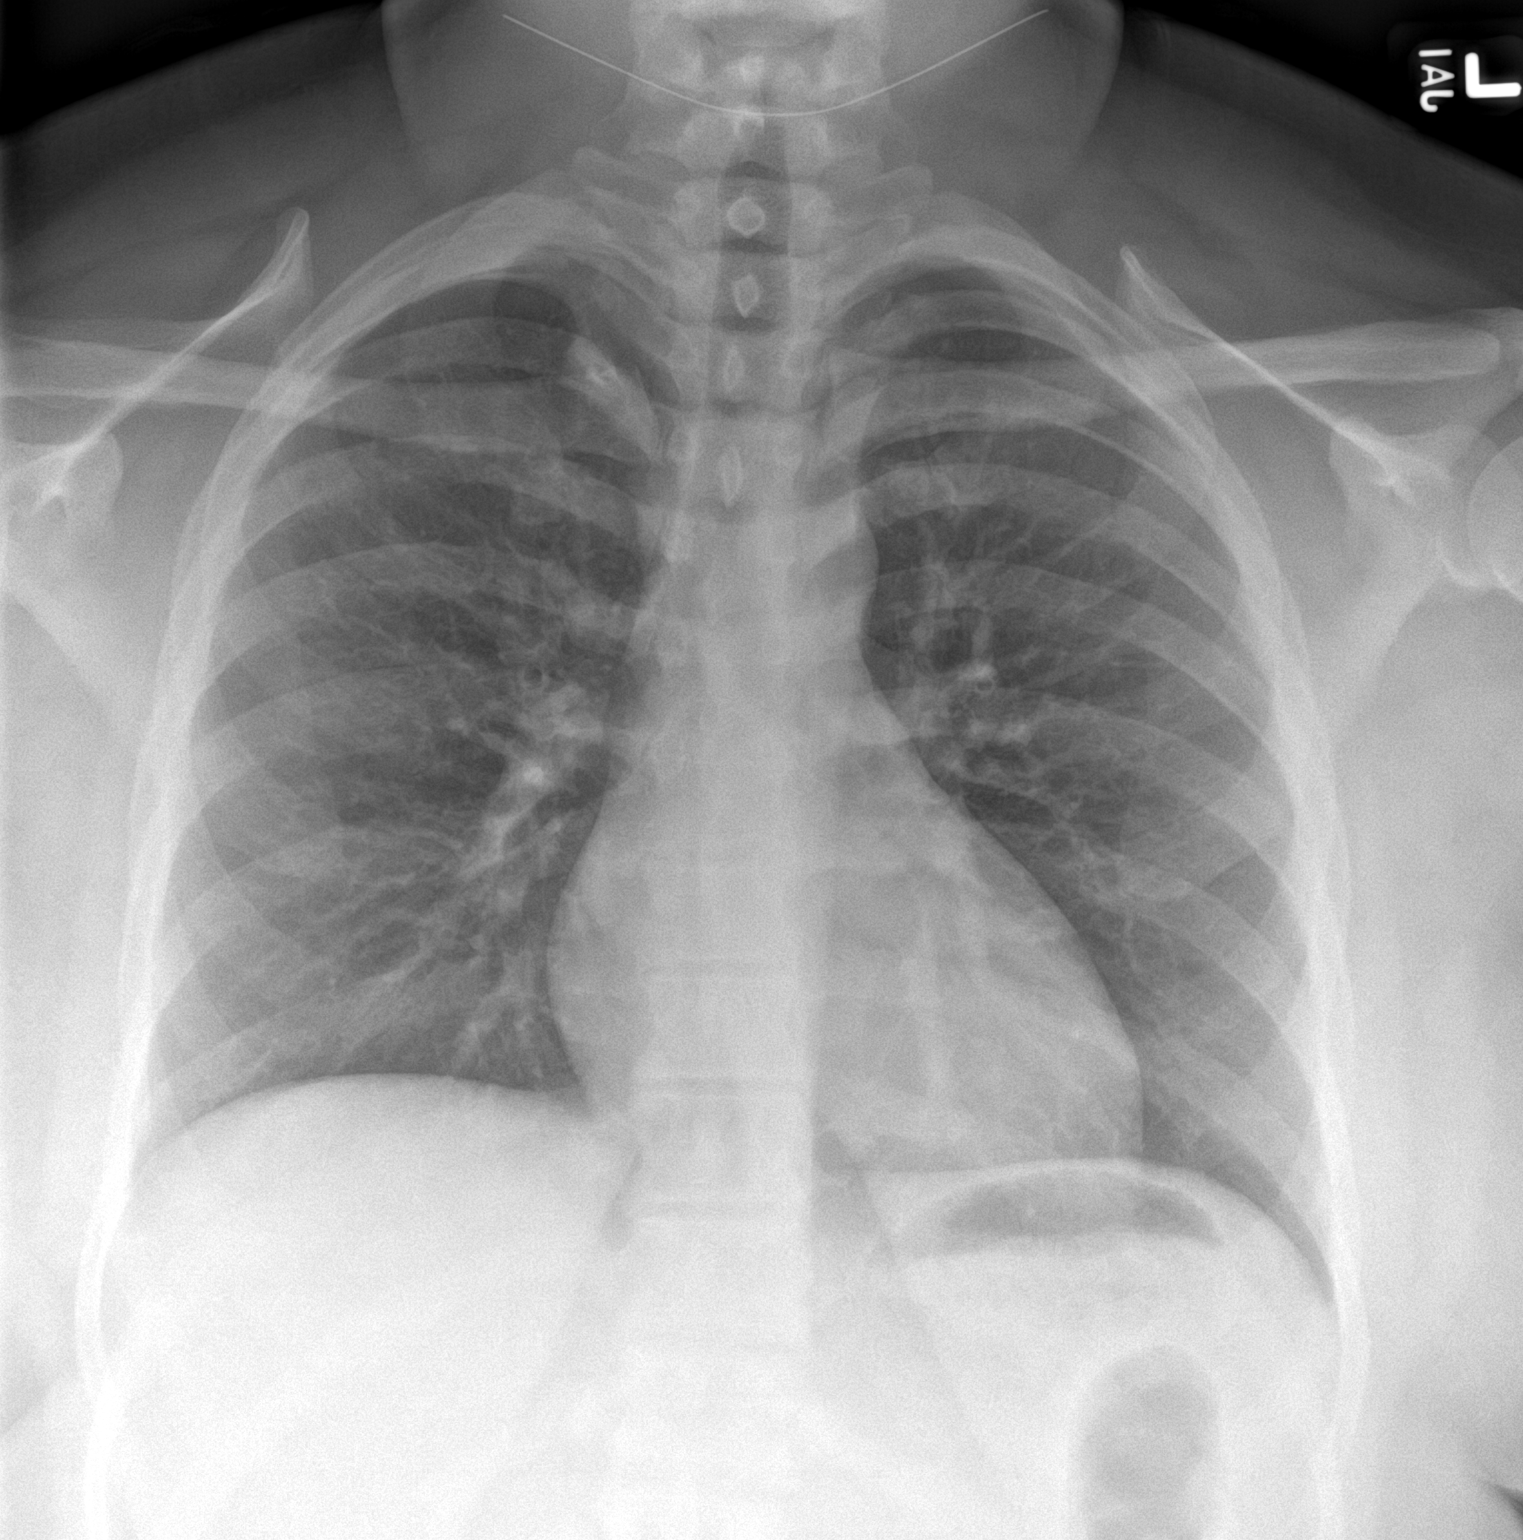

[chest lat]
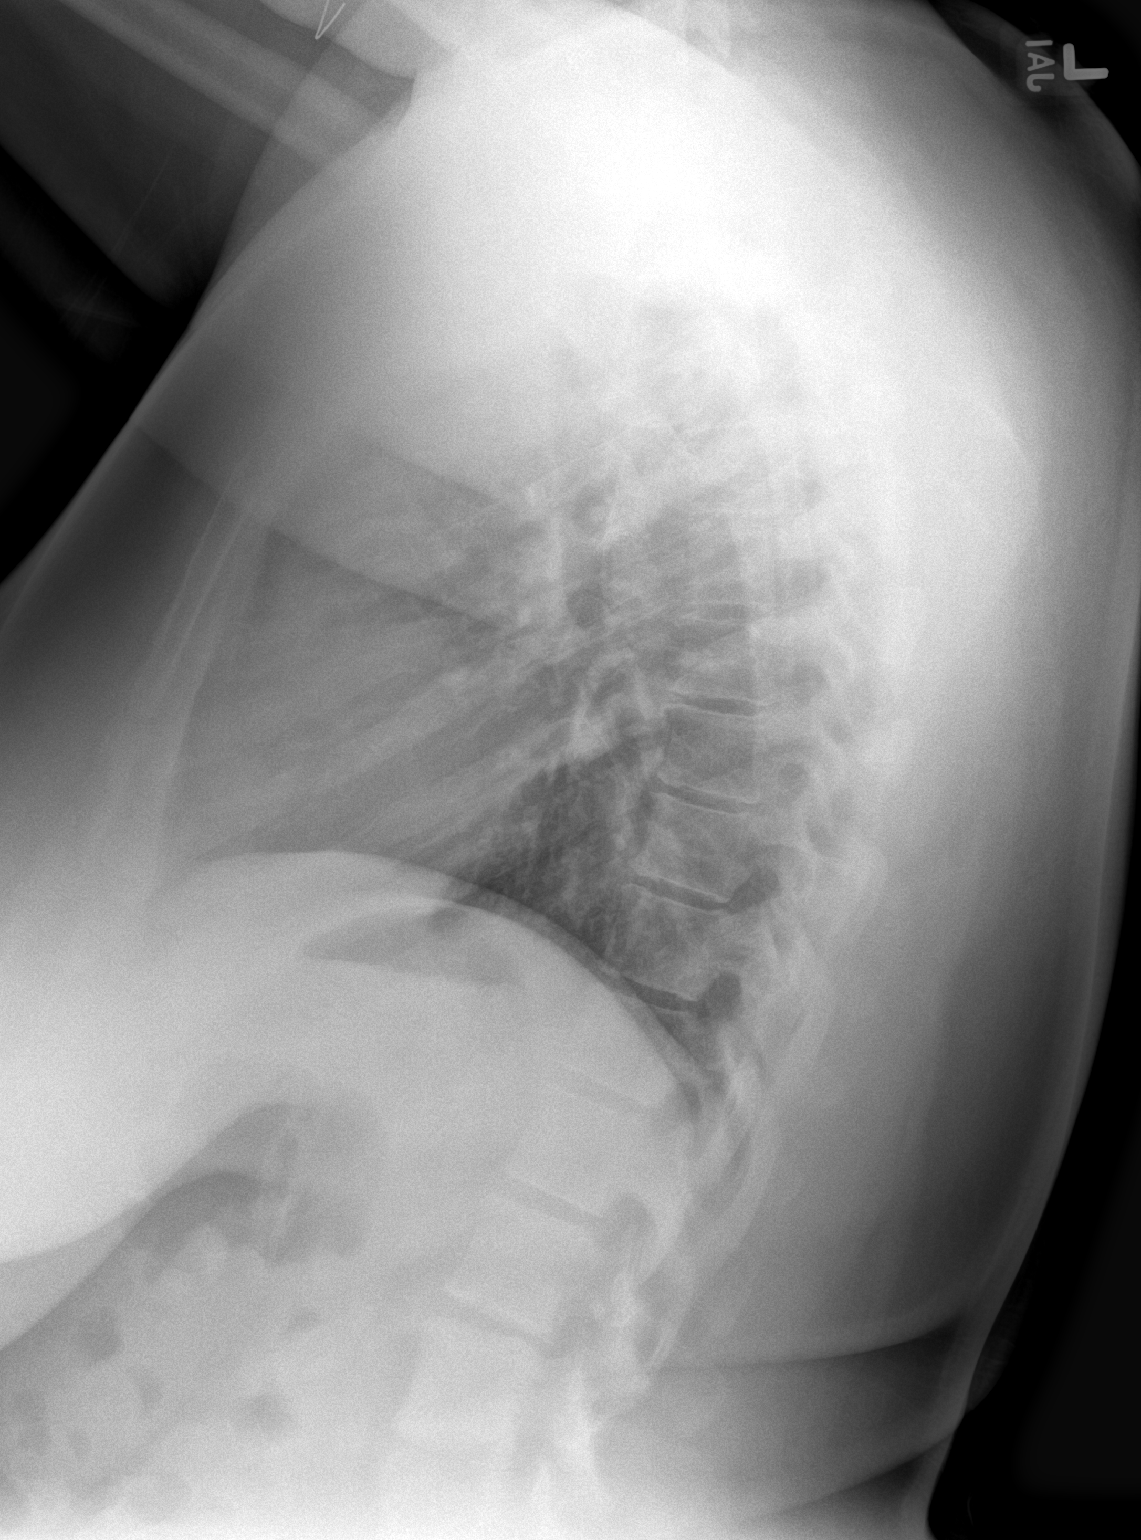

[2 of 2 positions shown; findings below may reference images not displayed]

FINDINGS: The heart size and mediastinal contours are within normal limits.
Both lungs are clear. The visualized skeletal structures are
unremarkable.
IMPRESSION: No active cardiopulmonary disease.

## 2022-12-27 ENCOUNTER — Ambulatory Visit: Payer: Managed Care, Other (non HMO) | Admitting: Emergency Medicine

## 2022-12-27 ENCOUNTER — Other Ambulatory Visit (HOSPITAL_COMMUNITY)
Admission: RE | Admit: 2022-12-27 | Discharge: 2022-12-27 | Disposition: A | Discharge status: Home / Self Care | Payer: Managed Care, Other (non HMO) | Source: Ambulatory Visit | Attending: Obstetrics and Gynecology | Admitting: Obstetrics and Gynecology

## 2022-12-27 VITALS — BP 155/77 | HR 96 | Wt 202.0 lb

## 2022-12-27 DIAGNOSIS — N898 Other specified noninflammatory disorders of vagina: Secondary | ICD-10-CM | POA: Diagnosis present

## 2022-12-27 DIAGNOSIS — R350 Frequency of micturition: Secondary | ICD-10-CM

## 2022-12-27 DIAGNOSIS — N912 Amenorrhea, unspecified: Secondary | ICD-10-CM

## 2022-12-27 NOTE — Progress Notes (Signed)
Pt presents for UPT. Has had amenorrhea since August 2024. Hx of tubal ligation in 2022.   Pt still unsure following negative urine pregnancy test in office, and desires beta hcg blood work to confirm that she is not pregnant.   Patient is very anxious in office. Per Donavan Foil, MD, patient may receive blood work to evaluate beta hcg.  Pt also has c/o vaginal odor and urinary frequency. Vaginal swab and urine culture collected by patient.  Pt BP elevated in office. Pt states this has been her baseline since pregnancy, and has not been taking her BP medication because the dose is too low. Also expresses that she is dissatisfied with PCP. PCP list given and pt encouraged to schedule PCP visit ASAP for evaluation of BP.

## 2022-12-28 LAB — BETA HCG QUANT (REF LAB): hCG Quant: <1 m[IU]/mL

## 2022-12-29 LAB — URINE CULTURE

## 2022-12-31 ENCOUNTER — Other Ambulatory Visit: Payer: Self-pay | Admitting: Obstetrics and Gynecology

## 2022-12-31 DIAGNOSIS — B9689 Other specified bacterial agents as the cause of diseases classified elsewhere: Secondary | ICD-10-CM

## 2022-12-31 LAB — CERVICOVAGINAL ANCILLARY ONLY
Bacterial Vaginitis (gardnerella): POSITIVE — AB
Candida Glabrata: NEGATIVE
Candida Vaginitis: NEGATIVE
Chlamydia: NEGATIVE
Comment: NEGATIVE
Comment: NEGATIVE
Comment: NEGATIVE
Comment: NEGATIVE
Comment: NEGATIVE
Comment: NORMAL
Neisseria Gonorrhea: NEGATIVE
Trichomonas: NEGATIVE

## 2022-12-31 MED ORDER — METRONIDAZOLE 500 MG PO TABS: 500.0000 mg | ORAL_TABLET | Freq: Two times a day (BID) | ORAL | 0 refills | Status: DC

## 2023-03-17 ENCOUNTER — Ambulatory Visit
Admission: EM | Admit: 2023-03-17 | Discharge: 2023-03-17 | Disposition: A | Discharge status: Home / Self Care | Payer: Medicaid Other | Attending: Physician Assistant | Admitting: Physician Assistant

## 2023-03-17 DIAGNOSIS — I10 Essential (primary) hypertension: Secondary | ICD-10-CM | POA: Diagnosis not present

## 2023-03-17 DIAGNOSIS — H6993 Unspecified Eustachian tube disorder, bilateral: Secondary | ICD-10-CM | POA: Diagnosis not present

## 2023-03-17 MED ORDER — AMLODIPINE BESYLATE 5 MG PO TABS: 5.0000 mg | ORAL_TABLET | Freq: Every day | ORAL | 0 refills | Status: DC

## 2023-03-17 MED ORDER — FLUTICASONE PROPIONATE 50 MCG/ACT NA SUSP: 1.0000 | Freq: Every day | NASAL | 0 refills | Status: DC

## 2023-03-17 NOTE — ED Provider Notes (Signed)
[name]    CSN: [id number] Arrival date & time: 03/17/23  1134      History   Chief Complaint Chief Complaint  Patient presents with   Otalgia   Facial Pain    HPI Gloria Kemp is a 27 y.o. female.   Patient here today for evaluation of bilateral ear pain and sinus congestion and pain.  She reports that she has had no runny nose.  She denies any fever.  Both of her children are also sick with similar symptoms.  She reports has had some mild cough.  She also request refill of blood pressure medication as she is not able to see a primary care provider for the next few months.  She notes she was on a medication previously and did well with same.  She denies any side effects.  She has not had any chest pain, shortness of breath, persistent headache, numbness or tingling.  The history is provided by the patient.  Otalgia Associated symptoms: congestion   Associated symptoms: no abdominal pain, no cough, no diarrhea, no fever, no headaches, no sore throat and no vomiting     Past Medical History:  Diagnosis Date   Gestational diabetes 10/27/2018   Possible VSD seen on fetal echo.  Current Diabetic Medications:  None  [x]  Aspirin 81 mg daily after 12 weeks (? A2/B GDM)  Required Referrals for A1GDM or A2GDM: [x]  Diabetes Education and Testing Supplies [x]  Nutrition Cousult  For A2/B GDM or higher classes of DM [ ]  Diabetes Education and Testing Supplies [ ]  Nutrition Counsult [ ]  Fetal ECHO after 20 weeks  [ ]  Eye exam for retina evaluation    Hypertension    IUGR (intrauterine growth restriction) affecting care of mother 02/12/2019   02/11/19: 8th%tile    Follow up UA dopplers in 2 weeks  NST in 1 week  Repeat growth in 3-4 weeks.  Initiate BPP at 32 weeks.  Guidelines for Antenatal Testing and Sonography  (with updated ICD-10 codes)  Updated 13-Nov-2018 with Dr. Noralee Space  IUGR- [phone]    EFW < 10%, nml Dopplers & AFV, AC<3%, - other comorbidities        EFW <  10% w/ AEDF & low AFV or EFW < 3%   Q 3-4wks   As per MFM  At d   Supervision of normal first pregnancy 09/16/2018    Nursing Staff Provider Office Location  [facility] Dating  LMP Language   English Anatomy US  Normal, but possible VSD on echo, repeat echo normal Flu Vaccine  Declined 12-11-18 Genetic Screen  NIPS:  Insufficient fetal fraction, declined testing  TDaP vaccine   Declined 03/06/19 Hgb A1C or  GTT Early: 5.7 Third trimester Failed 2 hr on 10/24/18 Rhogam   n/a   LAB RESULTS  Feeding Plan  Breastfeed     Patient Active Problem List   Diagnosis Date Noted   Proteinuria 07/20/2021   Tachycardia with hypertension 07/20/2021   Uncontrolled hypertension 07/20/2021   Obesity (BMI 30-39.9) 07/20/2021   SVD (spontaneous vaginal delivery) 01/02/2021   Chronic hypertension with superimposed pre-eclampsia 01/02/2021   Prediabetes 08/08/2020   Child with VSD (ventricular septal defect) 08/08/2020   History of gestational diabetes 06/28/2020   History of prior pregnancy with IUGR newborn 06/28/2020   Alpha thalassemia silent carrier 11-13-18    Past Surgical History:  Procedure Laterality Date   ADENOIDECTOMY     EYE SURGERY     TUBAL LIGATION Bilateral 01/03/2021  Procedure: POST PARTUM TUBAL LIGATION;  Surgeon: Levie Heritage, DO;  Location: [facility];  Service: Gynecology;  Laterality: Bilateral;    OB History     Gravida  2   Para  2   Term  2   Preterm      AB      Living  2      SAB      IAB      Ectopic      Multiple  0   Live Births  2            Home Medications    Prior to Admission medications   Medication Sig Start Date End Date Taking? Authorizing Provider  amLODipine (NORVASC) 5 MG tablet Take 1 tablet (5 mg total) by mouth daily. 03/17/23  Yes Tomi Bamberger, PA-C  fluticasone (FLONASE) 50 MCG/ACT nasal spray Place 1 spray into both nostrils daily. 03/17/23  Yes Tomi Bamberger, PA-C  metroNIDAZOLE (FLAGYL) 500 MG tablet Take 1  tablet (500 mg total) by mouth 2 (two) times daily. 12/31/22   Milas Hock, MD    Family History Family History  Problem Relation Age of Onset   Hypertension Mother    Obesity Mother    Diabetes Father    Hearing loss Father    Hypertension Father    Obesity Father    Stroke Paternal Aunt    Kidney disease Paternal Uncle    Hypertension Maternal Grandmother    Obesity Maternal Grandmother    Cancer Maternal Grandfather    Arthritis Paternal Grandmother    Diabetes Paternal Grandmother    Hypertension Paternal Grandmother    Miscarriages / Stillbirths Paternal Grandmother    Obesity Paternal Grandmother     Social History Social History   Tobacco Use   Smoking status: Never   Smokeless tobacco: Never  Vaping Use   Vaping status: Never Used  Substance Use Topics   Alcohol use: Yes    Comment: Occassionally.   Drug use: Never     Allergies   Patient has no known allergies.   Review of Systems Review of Systems  Constitutional:  Negative for chills and fever.  HENT:  Positive for congestion, ear pain and sinus pressure. Negative for sore throat.   Eyes:  Negative for discharge and redness.  Respiratory:  Negative for cough, shortness of breath and wheezing.   Cardiovascular:  Negative for chest pain.  Gastrointestinal:  Negative for abdominal pain, diarrhea, nausea and vomiting.  Neurological:  Negative for headaches.     Physical Exam Triage Vital Signs ED Triage Vitals  Encounter Vitals Group     BP 03/17/23 1239 (!) 166/99     Systolic BP Percentile --      Diastolic BP Percentile --      Pulse Rate 03/17/23 1239 (!) 102     Resp 03/17/23 1239 20     Temp 03/17/23 1239 98.5 F (36.9 C)     Temp Source 03/17/23 1239 Oral     SpO2 03/17/23 1239 99 %     Weight 03/17/23 1234 200 lb (90.7 kg)     Height 03/17/23 1234 5\' 2"  (1.575 m)     Head Circumference --      Peak Flow --      Pain Score 03/17/23 1231 6     Pain Loc --      Pain Education --       Exclude from Growth Chart --  No data found.  Updated Vital Signs BP (!) 158/109 (BP Location: Left Arm)   Pulse (!) 102   Temp 98.5 F (36.9 C) (Oral)   Resp 20   Ht 5\' 2"  (1.575 m)   Wt 200 lb (90.7 kg)   LMP 03/15/2023 (Exact Date)   SpO2 99%   BMI 36.58 kg/m   Visual Acuity Right Eye Distance:   Left Eye Distance:   Bilateral Distance:    Right Eye Near:   Left Eye Near:    Bilateral Near:     Physical Exam Vitals and nursing note reviewed.  Constitutional:      General: She is not in acute distress.    Appearance: Normal appearance. She is not ill-appearing.  HENT:     Head: Normocephalic and atraumatic.     Ears:     Comments: Bilateral TMs dull    Nose: Congestion present.     Mouth/Throat:     Mouth: Mucous membranes are moist.     Pharynx: No oropharyngeal exudate or posterior oropharyngeal erythema.  Eyes:     Conjunctiva/sclera: Conjunctivae normal.  Cardiovascular:     Rate and Rhythm: Normal rate and regular rhythm.     Heart sounds: Normal heart sounds. No murmur heard. Pulmonary:     Effort: Pulmonary effort is normal. No respiratory distress.     Breath sounds: Normal breath sounds. No wheezing, rhonchi or rales.  Skin:    General: Skin is warm and dry.  Neurological:     Mental Status: She is alert.  Psychiatric:        Mood and Affect: Mood normal.        Thought Content: Thought content normal.      UC Treatments / Results  Labs (all labs ordered are listed, but only abnormal results are displayed) Labs Reviewed - No data to display  EKG   Radiology No results found.  Procedures Procedures (including critical care time)  Medications Ordered in UC Medications - No data to display  Initial Impression / Assessment and Plan / UC Course  I have reviewed the triage vital signs and the nursing notes.  Pertinent labs & imaging results that were available during my care of the patient were reviewed by me and considered in  my medical decision making (see chart for details).    Suspect likely eustachian tube dysfunction and will treat with Flonase.  Advised follow-up if no gradual improvement of symptoms.  Amlodipine also refilled and recommend she keep appointment with primary care in the upcoming months.  Encouraged follow-up with any concerns.  Final Clinical Impressions(s) / UC Diagnoses   Final diagnoses:  ETD (Eustachian tube dysfunction), bilateral  Uncontrolled hypertension   Discharge Instructions   None    ED Prescriptions     Medication Sig Dispense Auth. Provider   amLODipine (NORVASC) 5 MG tablet Take 1 tablet (5 mg total) by mouth daily. 90 tablet Erma Pinto F, PA-C   fluticasone [facility]) 50 MCG/ACT nasal spray Place 1 spray into both nostrils daily. 15.8 mL Tomi Bamberger, PA-C      PDMP not reviewed this encounter.   Tomi Bamberger, PA-C 03/17/23 1447

## 2023-03-17 NOTE — ED Triage Notes (Signed)
"  Started with both ears hurting a few days and now having facial/sinus pain". No runny nose. No fever. "Both my kids are sick as well".

## 2023-05-27 ENCOUNTER — Ambulatory Visit (INDEPENDENT_AMBULATORY_CARE_PROVIDER_SITE_OTHER): Payer: Medicaid Other | Admitting: Family

## 2023-05-27 VITALS — BP 164/109 | HR 123 | Temp 98.4°F | Ht 61.5 in | Wt 210.0 lb

## 2023-05-27 DIAGNOSIS — Z7689 Persons encountering health services in other specified circumstances: Secondary | ICD-10-CM

## 2023-05-27 DIAGNOSIS — Z130 Encounter for screening for diseases of the blood and blood-forming organs and certain disorders involving the immune mechanism: Secondary | ICD-10-CM

## 2023-05-27 DIAGNOSIS — I10 Essential (primary) hypertension: Secondary | ICD-10-CM

## 2023-05-27 MED ORDER — AMLODIPINE BESYLATE 10 MG PO TABS
10.0000 mg | ORAL_TABLET | Freq: Every day | ORAL | 0 refills | Status: DC
Start: 2023-05-27 — End: 2023-06-25

## 2023-05-27 MED ORDER — VALSARTAN 40 MG PO TABS
40.0000 mg | ORAL_TABLET | Freq: Every day | ORAL | 1 refills | Status: DC
Start: 2023-05-27 — End: 2023-06-25

## 2023-05-27 MED ORDER — HYDRALAZINE HCL 10 MG PO TABS
25.0000 mg | ORAL_TABLET | Freq: Once | ORAL | Status: AC
Start: 2023-05-27 — End: 2023-05-27
  Administered 2023-05-27: 25 mg via ORAL

## 2023-05-27 NOTE — Progress Notes (Signed)
 Subjective:    Gloria Kemp - 28 y.o. female MRN [id number]  Date of birth: 1995-04-19  HPI  Gloria Kemp is to establish care.  Current issues and/or concerns: - Reports has not taken Amlodipine 5 mg in several months due to states pharmacy told her that she needs to bring a new health insurance card to them. States in the past she was taking two blood pressure medications which helped. She does not limit salt. She is planning to begin exercising again soon. She does not check blood pressure outside of office. She does not complain of red flag symptoms such as but not limited to chest pain, shortness of breath, worst headache of life, nausea/vomiting.  - Requests labs to check for anemia.  - No further issues/concerns for discussion today.   ROS per HPI     Health Maintenance:  Health Maintenance Due  Topic Date Due   HPV VACCINES (1 - 3-dose series) Never done   Cervical Cancer Screening (Pap smear)  10/06/2021   COVID-19 Vaccine (1 - 2024-25 season) Never done     Past Medical History: Patient Active Problem List   Diagnosis Date Noted   Proteinuria 07/20/2021   Tachycardia with hypertension 07/20/2021   Uncontrolled hypertension 07/20/2021   Obesity (BMI 30-39.9) 07/20/2021   SVD (spontaneous vaginal delivery) 01/02/2021   Chronic hypertension with superimposed pre-eclampsia 01/02/2021   Prediabetes 08/08/2020   Child with VSD (ventricular septal defect) 08/08/2020   History of gestational diabetes 06/28/2020   History of prior pregnancy with IUGR newborn 06/28/2020   Alpha thalassemia silent carrier 10/16/2018      Social History   reports that she has never smoked. She has never used smokeless tobacco. She reports current alcohol use. She reports that she does not use drugs.   Family History  family history includes Arthritis in her paternal grandmother; Cancer in her maternal grandfather; Diabetes in her father and paternal grandmother; Hearing loss in her  father; Hypertension in her father, maternal grandmother, mother, and paternal grandmother; Kidney disease in her paternal uncle; Miscarriages / [place] in her paternal grandmother; Obesity in her father, maternal grandmother, mother, and paternal grandmother; Stroke in her paternal aunt.   Medications: reviewed and updated   Objective:   Physical Exam BP (!) 164/109   Pulse (!) 123   Temp 98.4 F (36.9 C) (Oral)   Ht 5' 1.5" (1.562 m)   Wt 210 lb (95.3 kg)   LMP  (LMP Unknown)   SpO2 99%   BMI 39.04 kg/m      05/27/2023    2:00 PM 05/27/2023    1:19 PM 05/27/2023    1:03 PM  Vitals with BMI  Height   5' 1.5"  Weight   210 lbs  BMI   39.04  Systolic 164 166 782  Diastolic 109 128 956  Pulse   213    Physical Exam HENT:     Head: Normocephalic and atraumatic.     Nose: Nose normal.     Mouth/Throat:     Mouth: Mucous membranes are moist.     Pharynx: Oropharynx is clear.  Eyes:     Extraocular Movements: Extraocular movements intact.     Conjunctiva/sclera: Conjunctivae normal.     Pupils: Pupils are equal, round, and reactive to light.  Cardiovascular:     Rate and Rhythm: Tachycardia present.     Pulses: Normal pulses.     Heart sounds: Normal heart sounds.  Pulmonary:     Effort:  Pulmonary effort is normal.     Breath sounds: Normal breath sounds.  Musculoskeletal:        General: Normal range of motion.     Cervical back: Normal range of motion and neck supple.  Neurological:     General: No focal deficit present.     Mental Status: She is alert and oriented to person, place, and time.  Psychiatric:        Mood and Affect: Mood normal.        Behavior: Behavior normal.        Assessment & Plan:  1. Encounter to establish care (Primary) - Patient presents today to establish care. During the interim follow-up with primary provider as scheduled.  - Return for annual physical examination, labs, and health maintenance. Arrive fasting meaning having no food  for at least 8 hours prior to appointment. You may have only water or black coffee. Please take scheduled medications as normal.  2. Primary hypertension - Blood pressure not at goal during today's visit. Patient asymptomatic without chest pressure, chest pain, palpitations, shortness of breath, worst headache of life, and any additional red flag symptoms. - Hydralazine administered in office with minimal improvement of blood pressure. - Increase Amlodipine from 5 mg to 10 mg as prescribed.  - Begin Valsartan as prescribed.  - Routine screening.  - Counseled on blood pressure goal of less than 130/80, low-sodium, DASH diet, medication compliance, and 150 minutes of moderate intensity exercise per week as tolerated. Counseled on medication adherence and adverse effects. - Follow-up with primary provider in 4 weeks or sooner if needed.  - amLODipine (NORVASC) 10 MG tablet; Take 1 tablet (10 mg total) by mouth daily.  Dispense: 90 tablet; Refill: 0 - valsartan (DIOVAN) 40 MG tablet; Take 1 tablet (40 mg total) by mouth daily.  Dispense: 30 tablet; Refill: 1 - hydrALAZINE (APRESOLINE) tablet 25 mg - Basic Metabolic Panel  3. Screening for deficiency anemia - Routine screening.  - CBC - Iron, TIBC and Ferritin Panel     Patient was given clear instructions to go to Emergency Department or return to medical center if symptoms don't improve, worsen, or new problems develop.The patient verbalized understanding.  I discussed the assessment and treatment plan with the patient. The patient was provided an opportunity to ask questions and all were answered. The patient agreed with the plan and demonstrated an understanding of the instructions.   The patient was advised to call back or seek an in-person evaluation if the symptoms worsen or if the condition fails to improve as anticipated.    Ricky Stabs, NP 05/27/2023, 2:38 PM Primary Care at [facility]

## 2023-05-27 NOTE — Progress Notes (Signed)
 States her blood pressure has been high.   Asking for iron to be checked.

## 2023-05-28 ENCOUNTER — Encounter: Payer: Self-pay | Admitting: Family

## 2023-05-28 ENCOUNTER — Other Ambulatory Visit: Payer: Self-pay | Admitting: Family

## 2023-05-28 DIAGNOSIS — D649 Anemia, unspecified: Secondary | ICD-10-CM

## 2023-05-28 LAB — CBC
Hematocrit: 36.6 % (ref 34.0–46.6)
Hemoglobin: 11.4 g/dL (ref 11.1–15.9)
MCH: 22.5 pg — ABNORMAL LOW (ref 26.6–33.0)
MCHC: 31.1 g/dL — ABNORMAL LOW (ref 31.5–35.7)
MCV: 72 fL — ABNORMAL LOW (ref 79–97)
Platelets: 296 10*3/uL (ref 150–450)
RBC: 5.06 x10E6/uL (ref 3.77–5.28)
RDW: 15.3 % (ref 11.7–15.4)
WBC: 9.6 10*3/uL (ref 3.4–10.8)

## 2023-05-28 LAB — BASIC METABOLIC PANEL
BUN/Creatinine Ratio: 11 (ref 9–23)
BUN: 11 mg/dL (ref 6–20)
CO2: 21 mmol/L (ref 20–29)
Calcium: 9.5 mg/dL (ref 8.7–10.2)
Chloride: 102 mmol/L (ref 96–106)
Creatinine, Ser: 0.99 mg/dL (ref 0.57–1.00)
Glucose: 90 mg/dL (ref 70–99)
Potassium: 4.2 mmol/L (ref 3.5–5.2)
Sodium: 135 mmol/L (ref 134–144)
eGFR: 81 mL/min/{1.73_m2} (ref >59)

## 2023-05-28 LAB — IRON,TIBC AND FERRITIN PANEL
Ferritin: 11 ng/mL — ABNORMAL LOW (ref 15–150)
Iron Saturation: 10 % — ABNORMAL LOW (ref 15–55)
Iron: 48 ug/dL (ref 27–159)
Total Iron Binding Capacity: 461 ug/dL — ABNORMAL HIGH (ref 250–450)
UIBC: 413 ug/dL (ref 131–425)

## 2023-05-30 ENCOUNTER — Encounter: Payer: Self-pay | Admitting: Family

## 2023-06-11 ENCOUNTER — Ambulatory Visit: Admitting: Family

## 2023-06-13 ENCOUNTER — Encounter: Payer: Self-pay | Admitting: Family Medicine

## 2023-06-13 ENCOUNTER — Ambulatory Visit (INDEPENDENT_AMBULATORY_CARE_PROVIDER_SITE_OTHER): Admitting: Family Medicine

## 2023-06-13 VITALS — BP 145/94 | HR 114 | Temp 98.0°F | Resp 16 | Ht 62.0 in | Wt 209.6 lb

## 2023-06-13 DIAGNOSIS — E66812 Obesity, class 2: Secondary | ICD-10-CM | POA: Diagnosis not present

## 2023-06-13 DIAGNOSIS — I10 Essential (primary) hypertension: Secondary | ICD-10-CM | POA: Diagnosis not present

## 2023-06-13 DIAGNOSIS — Z6838 Body mass index (BMI) 38.0-38.9, adult: Secondary | ICD-10-CM

## 2023-06-13 MED ORDER — VALSARTAN 80 MG PO TABS
80.0000 mg | ORAL_TABLET | Freq: Every day | ORAL | 0 refills | Status: DC
Start: 2023-06-13 — End: 2023-06-25

## 2023-06-13 NOTE — Progress Notes (Signed)
 Established Patient Office Visit  Subjective    Patient ID: Gloria Kemp, female    DOB: 1995/03/30  Age: 28 y.o. MRN: [id number]  CC:  Chief Complaint  Patient presents with   Follow-up    B/p    HPI Gloria Kemp presents for follow up of hypertension. Patient reports med compliance and denies acute complaints.   Outpatient Encounter Medications as of 06/13/2023  Medication Sig   amLODipine (NORVASC) 10 MG tablet Take 1 tablet (10 mg total) by mouth daily.   valsartan (DIOVAN) 40 MG tablet Take 1 tablet (40 mg total) by mouth daily.   valsartan (DIOVAN) 80 MG tablet Take 1 tablet (80 mg total) by mouth daily.   fluticasone (FLONASE) 50 MCG/ACT nasal spray Place 1 spray into both nostrils daily. (Patient not taking: Reported on 05/27/2023)   metroNIDAZOLE (FLAGYL) 500 MG tablet Take 1 tablet (500 mg total) by mouth 2 (two) times daily. (Patient not taking: Reported on 05/27/2023)   No facility-administered encounter medications on file as of 06/13/2023.    Past Medical History:  Diagnosis Date   Gestational diabetes 10/27/2018   Possible VSD seen on fetal echo.  Current Diabetic Medications:  None  [x]  Aspirin 81 mg daily after 12 weeks (? A2/B GDM)  Required Referrals for A1GDM or A2GDM: [x]  Diabetes Education and Testing Supplies [x]  Nutrition Cousult  For A2/B GDM or higher classes of DM [ ]  Diabetes Education and Testing Supplies [ ]  Nutrition Counsult [ ]  Fetal ECHO after 20 weeks  [ ]  Eye exam for retina evaluation    Hypertension    IUGR (intrauterine growth restriction) affecting care of mother 02/12/2019   02/11/19: 8th%tile    Follow up UA dopplers in 2 weeks  NST in 1 week  Repeat growth in 3-4 weeks.  Initiate BPP at 32 weeks.  Guidelines for Antenatal Testing and Sonography  (with updated ICD-10 codes)  Updated 09-Nov-2018 with Dr. Noralee Space  IUGR- [phone]    EFW < 10%, nml Dopplers & AFV, AC<3%, - other comorbidities        EFW < 10% w/ AEDF & low AFV or EFW < 3%   Q  3-4wks   As per MFM  At d   Supervision of normal first pregnancy 09/16/2018    Nursing Staff Provider Office Location  [facility] Dating  LMP Language   English Anatomy US  Normal, but possible VSD on echo, repeat echo normal Flu Vaccine  Declined 12-11-18 Genetic Screen  NIPS:  Insufficient fetal fraction, declined testing  TDaP vaccine   Declined 03/06/19 Hgb A1C or  GTT Early: 5.7 Third trimester Failed 2 hr on 10/24/18 Rhogam   n/a   LAB RESULTS  Feeding Plan  Breastfeed     Past Surgical History:  Procedure Laterality Date   ADENOIDECTOMY     EYE SURGERY     TUBAL LIGATION Bilateral 01/03/2021   Procedure: POST PARTUM TUBAL LIGATION;  Surgeon: Levie Heritage, DO;  Location: [facility];  Service: Gynecology;  Laterality: Bilateral;    Family History  Problem Relation Age of Onset   Hypertension Mother    Obesity Mother    Diabetes Father    Hearing loss Father    Hypertension Father    Obesity Father    Stroke Paternal Aunt    Kidney disease Paternal Uncle    Hypertension Maternal Grandmother    Obesity Maternal Grandmother    Cancer Maternal Grandfather    Arthritis Paternal Grandmother  Diabetes Paternal Grandmother    Hypertension Paternal Grandmother    Miscarriages / Stillbirths Paternal Grandmother    Obesity Paternal Grandmother     Social History   Socioeconomic History   Marital status: Single    Spouse name: Not on file   Number of children: 0   Years of education: Not on file   Highest education level: Not on file  Occupational History   Occupation: [employer]  Tobacco Use   Smoking status: Never   Smokeless tobacco: Never  Vaping Use   Vaping status: Never Used  Substance and Sexual Activity   Alcohol use: Yes    Comment: Occassionally.   Drug use: Never   Sexual activity: Yes    Birth control/protection: None  Other Topics Concern   Not on file  Social History Narrative   Not on file   Social Drivers of Health   Financial Resource Strain:  Low Risk  (05/27/2023)   Overall Financial Resource Strain (CARDIA)    Difficulty of Paying Living Expenses: Not hard at all  Food Insecurity: No Food Insecurity (06/13/2023)   Hunger Vital Sign    Worried About Running Out of Food in the Last Year: Never true    Ran Out of Food in the Last Year: Never true  Transportation Needs: No Transportation Needs (06/13/2023)   PRAPARE - Administrator, Civil Service (Medical): No    Lack of Transportation (Non-Medical): No  Physical Activity: Inactive (05/27/2023)   Exercise Vital Sign    Days of Exercise per Week: 0 days    Minutes of Exercise per Session: 0 min  Stress: No Stress Concern Present (05/27/2023)   [employer] of Occupational Health - Occupational Stress Questionnaire    Feeling of Stress : Not at all  Social Connections: Socially Isolated (05/27/2023)   Social Connection and Isolation Panel [NHANES]    Frequency of Communication with Friends and Family: More than three times a week    Frequency of Social Gatherings with Friends and Family: Once a week    Attends Religious Services: Never    Database administrator or Organizations: No    Attends Banker Meetings: Never    Marital Status: Never married  Intimate Partner Violence: Not At Risk (05/27/2023)   Humiliation, Afraid, Rape, and Kick questionnaire    Fear of Current or Ex-Partner: No    Emotionally Abused: No    Physically Abused: No    Sexually Abused: No    Review of Systems  All other systems reviewed and are negative.       Objective    BP (!) 145/94   Pulse (!) 114   Temp 98 F (36.7 C) (Oral)   Resp 16   Ht 5\' 2"  (1.575 m)   Wt 209 lb 9.6 oz (95.1 kg)   LMP  (LMP Unknown)   SpO2 98%   BMI 38.34 kg/m   Physical Exam Vitals and nursing note reviewed.  Constitutional:      General: She is not in acute distress. Cardiovascular:     Rate and Rhythm: Normal rate and regular rhythm.  Pulmonary:     Effort: Pulmonary effort is  normal.     Breath sounds: Normal breath sounds.  Abdominal:     Palpations: Abdomen is soft.     Tenderness: There is no abdominal tenderness.  Neurological:     General: No focal deficit present.     Mental Status: She is alert  and oriented to person, place, and time.         Assessment & Plan:  1. Essential hypertension (Primary) Elevated readings. Will increase valstartan from 40 mg to 80 mg daily  2. Class 2 severe obesity due to excess calories with serious comorbidity and body mass index (BMI) of 38.0 to 38.9 in adult [facility])  Return in about 2 weeks (around 06/27/2023).   Tommie Raymond, MD

## 2023-06-14 ENCOUNTER — Other Ambulatory Visit: Payer: Self-pay | Admitting: Family

## 2023-06-14 DIAGNOSIS — D649 Anemia, unspecified: Secondary | ICD-10-CM

## 2023-06-14 NOTE — Telephone Encounter (Signed)
 Left message to patient to let her know referral was sent in.

## 2023-06-14 NOTE — Telephone Encounter (Signed)
 Complete

## 2023-06-18 ENCOUNTER — Encounter: Payer: Self-pay | Admitting: Family Medicine

## 2023-06-19 ENCOUNTER — Other Ambulatory Visit: Payer: Self-pay | Admitting: Family

## 2023-06-19 DIAGNOSIS — I10 Essential (primary) hypertension: Secondary | ICD-10-CM

## 2023-06-25 ENCOUNTER — Ambulatory Visit (INDEPENDENT_AMBULATORY_CARE_PROVIDER_SITE_OTHER): Payer: Self-pay | Admitting: Family

## 2023-06-25 VITALS — BP 120/83 | HR 114 | Temp 98.4°F | Ht 61.75 in | Wt 209.4 lb

## 2023-06-25 DIAGNOSIS — Z1329 Encounter for screening for other suspected endocrine disorder: Secondary | ICD-10-CM | POA: Diagnosis not present

## 2023-06-25 DIAGNOSIS — Z131 Encounter for screening for diabetes mellitus: Secondary | ICD-10-CM | POA: Diagnosis not present

## 2023-06-25 DIAGNOSIS — Z0000 Encounter for general adult medical examination without abnormal findings: Secondary | ICD-10-CM | POA: Diagnosis not present

## 2023-06-25 DIAGNOSIS — Z124 Encounter for screening for malignant neoplasm of cervix: Secondary | ICD-10-CM

## 2023-06-25 DIAGNOSIS — Z13220 Encounter for screening for lipoid disorders: Secondary | ICD-10-CM | POA: Diagnosis not present

## 2023-06-25 DIAGNOSIS — I10 Essential (primary) hypertension: Secondary | ICD-10-CM

## 2023-06-25 MED ORDER — AMLODIPINE BESYLATE 10 MG PO TABS
10.0000 mg | ORAL_TABLET | Freq: Every day | ORAL | 0 refills | Status: DC
Start: 2023-06-25 — End: 2023-08-09

## 2023-06-25 MED ORDER — VALSARTAN 80 MG PO TABS
80.0000 mg | ORAL_TABLET | Freq: Every day | ORAL | 0 refills | Status: DC
Start: 2023-06-25 — End: 2023-08-09

## 2023-06-25 NOTE — Progress Notes (Signed)
 Patient ID: Mirza Kidney, female    DOB: 04-05-1995  MRN: [id number]  CC: Annual Exam  Subjective: Gloria Kemp is a 28 y.o. female who presents for annual exam.   Her concerns today include:  - Doing well on Amlodipine and Valsartan, no issues/concerns. She does not complain of red flag symptoms such as but not limited to chest pain, shortness of breath, worst headache of life, nausea/vomiting.   Patient Active Problem List   Diagnosis Date Noted   Proteinuria 07/20/2021   Tachycardia with hypertension 07/20/2021   Uncontrolled hypertension 07/20/2021   Obesity (BMI 30-39.9) 07/20/2021   SVD (spontaneous vaginal delivery) 01/02/2021   Chronic hypertension with superimposed pre-eclampsia 01/02/2021   Prediabetes 08/08/2020   Child with VSD (ventricular septal defect) 08/08/2020   History of gestational diabetes 06/28/2020   History of prior pregnancy with IUGR newborn 06/28/2020   Alpha thalassemia silent carrier 10/16/2018     No current outpatient medications on file prior to visit.   No current facility-administered medications on file prior to visit.    No Known Allergies  Social History   Socioeconomic History   Marital status: Single    Spouse name: Not on file   Number of children: 0   Years of education: Not on file   Highest education level: Not on file  Occupational History   Occupation: [employer]  Tobacco Use   Smoking status: Never   Smokeless tobacco: Never  Vaping Use   Vaping status: Never Used  Substance and Sexual Activity   Alcohol use: Yes    Comment: Occassionally.   Drug use: Never   Sexual activity: Yes    Birth control/protection: None  Other Topics Concern   Not on file  Social History Narrative   Not on file   Social Drivers of Health   Financial Resource Strain: Low Risk  (05/27/2023)   Overall Financial Resource Strain (CARDIA)    Difficulty of Paying Living Expenses: Not hard at all  Food Insecurity: No Food Insecurity  (06/13/2023)   Hunger Vital Sign    Worried About Running Out of Food in the Last Year: Never true    Ran Out of Food in the Last Year: Never true  Transportation Needs: No Transportation Needs (06/13/2023)   PRAPARE - Administrator, Civil Service (Medical): No    Lack of Transportation (Non-Medical): No  Physical Activity: Inactive (05/27/2023)   Exercise Vital Sign    Days of Exercise per Week: 0 days    Minutes of Exercise per Session: 0 min  Stress: No Stress Concern Present (05/27/2023)   [employer] of Occupational Health - Occupational Stress Questionnaire    Feeling of Stress : Not at all  Social Connections: Socially Isolated (05/27/2023)   Social Connection and Isolation Panel [NHANES]    Frequency of Communication with Friends and Family: More than three times a week    Frequency of Social Gatherings with Friends and Family: Once a week    Attends Religious Services: Never    Database administrator or Organizations: No    Attends Banker Meetings: Never    Marital Status: Never married  Intimate Partner Violence: Not At Risk (05/27/2023)   Humiliation, Afraid, Rape, and Kick questionnaire    Fear of Current or Ex-Partner: No    Emotionally Abused: No    Physically Abused: No    Sexually Abused: No    Family History  Problem Relation Age of Onset  Hypertension Mother    Obesity Mother    Diabetes Father    Hearing loss Father    Hypertension Father    Obesity Father    Stroke Paternal Aunt    Kidney disease Paternal Uncle    Hypertension Maternal Grandmother    Obesity Maternal Grandmother    Cancer Maternal Grandfather    Arthritis Paternal Grandmother    Diabetes Paternal Grandmother    Hypertension Paternal Grandmother    Miscarriages / Stillbirths Paternal Grandmother    Obesity Paternal Grandmother     Past Surgical History:  Procedure Laterality Date   ADENOIDECTOMY     EYE SURGERY     TUBAL LIGATION Bilateral 01/03/2021    Procedure: POST PARTUM TUBAL LIGATION;  Surgeon: Levie Heritage, DO;  Location: [facility];  Service: Gynecology;  Laterality: Bilateral;    ROS: Review of Systems Negative except as stated above  PHYSICAL EXAM: BP 120/83   Pulse (!) 114   Temp 98.4 F (36.9 C) (Oral)   Ht 5' 1.75" (1.568 m)   Wt 209 lb 6.4 oz (95 kg)   LMP 06/24/2023 (Exact Date)   SpO2 98%   BMI 38.61 kg/m   Physical Exam HENT:     Head: Normocephalic and atraumatic.     Right Ear: Tympanic membrane, ear canal and external ear normal.     Left Ear: Tympanic membrane, ear canal and external ear normal.     Nose: Nose normal.     Mouth/Throat:     Mouth: Mucous membranes are moist.     Pharynx: Oropharynx is clear.  Eyes:     Extraocular Movements: Extraocular movements intact.     Conjunctiva/sclera: Conjunctivae normal.     Pupils: Pupils are equal, round, and reactive to light.  Neck:     Thyroid: No thyroid mass, thyromegaly or thyroid tenderness.  Cardiovascular:     Rate and Rhythm: Tachycardia present.     Pulses: Normal pulses.     Heart sounds: Normal heart sounds.  Pulmonary:     Effort: Pulmonary effort is normal.     Breath sounds: Normal breath sounds.  Chest:     Comments: Patient declined. Abdominal:     General: Bowel sounds are normal.     Palpations: Abdomen is soft.  Genitourinary:    Comments: Patient declined. Musculoskeletal:        General: Normal range of motion.     Right shoulder: Normal.     Left shoulder: Normal.     Right upper arm: Normal.     Left upper arm: Normal.     Right elbow: Normal.     Left elbow: Normal.     Right forearm: Normal.     Left forearm: Normal.     Right wrist: Normal.     Left wrist: Normal.     Right hand: Normal.     Left hand: Normal.     Cervical back: Normal, normal range of motion and neck supple.     Thoracic back: Normal.     Lumbar back: Normal.     Right hip: Normal.     Left hip: Normal.     Right upper leg: Normal.      Left upper leg: Normal.     Right knee: Normal.     Left knee: Normal.     Right lower leg: Normal.     Left lower leg: Normal.     Right ankle: Normal.  Left ankle: Normal.     Right foot: Normal.     Left foot: Normal.  Skin:    General: Skin is warm and dry.     Capillary Refill: Capillary refill takes less than 2 seconds.  Neurological:     General: No focal deficit present.     Mental Status: She is alert and oriented to person, place, and time.  Psychiatric:        Mood and Affect: Mood normal.        Behavior: Behavior normal.    ASSESSMENT AND PLAN: 1. Annual physical exam (Primary) - Counseled on 150 minutes of exercise per week as tolerated, healthy eating (including decreased daily intake of saturated fats, cholesterol, added sugars, sodium), STI prevention, and routine healthcare maintenance.  2. Diabetes mellitus screening - Routine screening.  - Hemoglobin A1c  3. Screening cholesterol level - Routine screening.  - Lipid panel  4. Thyroid disorder screen - Routine screening.  - TSH  5. Pap smear for cervical cancer screening - Referral to Gynecology for evaluation/management. - Ambulatory referral to Gynecology  6. Primary hypertension - Continue Amlodipine and Valsartan as prescribed.  - Routine screening.  - Counseled on blood pressure goal of less than 130/80, low-sodium, DASH diet, medication compliance, and 150 minutes of moderate intensity exercise per week as tolerated. Counseled on medication adherence and adverse effects. - Follow-up with primary provider in 3 months or sooner if needed.  - valsartan (DIOVAN) 80 MG tablet; Take 1 tablet (80 mg total) by mouth daily.  Dispense: 90 tablet; Refill: 0 - amLODipine (NORVASC) 10 MG tablet; Take 1 tablet (10 mg total) by mouth daily.  Dispense: 90 tablet; Refill: 0 - CMP14+EGFR      Patient was given the opportunity to ask questions.  Patient verbalized understanding of the plan and was able  to repeat key elements of the plan. Patient was given clear instructions to go to Emergency Department or return to medical center if symptoms don't improve, worsen, or new problems develop.The patient verbalized understanding.   Orders Placed This Encounter  Procedures   CMP14+EGFR   Hemoglobin A1c   Lipid panel   TSH   Ambulatory referral to Gynecology     Requested Prescriptions   Signed Prescriptions Disp Refills   valsartan (DIOVAN) 80 MG tablet 90 tablet 0    Sig: Take 1 tablet (80 mg total) by mouth daily.   amLODipine (NORVASC) 10 MG tablet 90 tablet 0    Sig: Take 1 tablet (10 mg total) by mouth daily.    Return in about 1 year (around 06/24/2024) for Physical per patient preference and 3 months chronic conditions .  Rema Fendt, NP

## 2023-06-25 NOTE — Progress Notes (Signed)
 Patient states no concerns to discuss.

## 2023-06-26 ENCOUNTER — Encounter: Payer: Self-pay | Admitting: Family

## 2023-06-26 ENCOUNTER — Other Ambulatory Visit: Payer: Self-pay | Admitting: Family

## 2023-06-26 DIAGNOSIS — Z13220 Encounter for screening for lipoid disorders: Secondary | ICD-10-CM

## 2023-06-26 LAB — LIPID PANEL
Chol/HDL Ratio: 3.8 ratio (ref 0.0–4.4)
Cholesterol, Total: 177 mg/dL (ref 100–199)
HDL: 47 mg/dL (ref >39)
LDL Chol Calc (NIH): 100 mg/dL — ABNORMAL HIGH (ref 0–99)
Triglycerides: 173 mg/dL — ABNORMAL HIGH (ref 0–149)
VLDL Cholesterol Cal: 30 mg/dL (ref 5–40)

## 2023-06-26 LAB — CMP14+EGFR
ALT: 17 IU/L (ref 0–32)
AST: 20 IU/L (ref 0–40)
Albumin: 4.5 g/dL (ref 4.0–5.0)
Alkaline Phosphatase: 97 IU/L (ref 44–121)
BUN/Creatinine Ratio: 14 (ref 9–23)
BUN: 12 mg/dL (ref 6–20)
Bilirubin Total: <0.2 mg/dL (ref 0.0–1.2)
CO2: 19 mmol/L — ABNORMAL LOW (ref 20–29)
Calcium: 9.7 mg/dL (ref 8.7–10.2)
Chloride: 103 mmol/L (ref 96–106)
Creatinine, Ser: 0.87 mg/dL (ref 0.57–1.00)
Globulin, Total: 2.5 g/dL (ref 1.5–4.5)
Glucose: 124 mg/dL — ABNORMAL HIGH (ref 70–99)
Potassium: 4.1 mmol/L (ref 3.5–5.2)
Sodium: 137 mmol/L (ref 134–144)
Total Protein: 7 g/dL (ref 6.0–8.5)
eGFR: 94 mL/min/{1.73_m2} (ref >59)

## 2023-06-26 LAB — HEMOGLOBIN A1C
Est. average glucose Bld gHb Est-mCnc: 134 mg/dL
Hgb A1c MFr Bld: 6.3 % — ABNORMAL HIGH (ref 4.8–5.6)

## 2023-06-26 LAB — TSH: TSH: 1.08 u[IU]/mL (ref 0.450–4.500)

## 2023-06-28 ENCOUNTER — Ambulatory Visit (INDEPENDENT_AMBULATORY_CARE_PROVIDER_SITE_OTHER): Admitting: Family

## 2023-06-28 VITALS — BP 131/82 | HR 101 | Temp 98.4°F | Ht 61.75 in | Wt 208.0 lb

## 2023-06-28 DIAGNOSIS — I10 Essential (primary) hypertension: Secondary | ICD-10-CM | POA: Diagnosis not present

## 2023-06-28 NOTE — Progress Notes (Signed)
 Patient ID: Gloria Kemp, female    DOB: [birth year]  MRN: [id number]  CC: Chronic Conditions Follow-Up  Subjective: Gloria Kemp is a 28 y.o. female who presents for chronic conditions follow-up.   Her concerns today include:  Doing well on Amlodipine and Valsartan, no issues/concerns. She does not complain of red flag symptoms such as but not limited to chest pain, shortness of breath, worst headache of life, nausea/vomiting.    Patient Active Problem List   Diagnosis Date Noted   Proteinuria 07/20/2021   Tachycardia with hypertension 07/20/2021   Uncontrolled hypertension 07/20/2021   Obesity (BMI 30-39.9) 07/20/2021   SVD (spontaneous vaginal delivery) 01/02/2021   Chronic hypertension with superimposed pre-eclampsia 01/02/2021   Prediabetes 08/08/2020   Child with VSD (ventricular septal defect) 08/08/2020   History of gestational diabetes 06/28/2020   History of prior pregnancy with IUGR newborn 06/28/2020   Alpha thalassemia silent carrier 10/16/2018     Current Outpatient Medications on File Prior to Visit  Medication Sig Dispense Refill   amLODipine (NORVASC) 10 MG tablet Take 1 tablet (10 mg total) by mouth daily. 90 tablet 0   valsartan (DIOVAN) 80 MG tablet Take 1 tablet (80 mg total) by mouth daily. 90 tablet 0   No current facility-administered medications on file prior to visit.    No Known Allergies  Social History   Socioeconomic History   Marital status: Single    Spouse name: Not on file   Number of children: 0   Years of education: Not on file   Highest education level: Not on file  Occupational History   Occupation: [employer]  Tobacco Use   Smoking status: Never   Smokeless tobacco: Never  Vaping Use   Vaping status: Never Used  Substance and Sexual Activity   Alcohol use: Yes    Comment: Occassionally.   Drug use: Never   Sexual activity: Yes    Birth control/protection: None  Other Topics Concern   Not on file  Social History  Narrative   Not on file   Social Drivers of Health   Financial Resource Strain: Low Risk  (05/27/2023)   Overall Financial Resource Strain (CARDIA)    Difficulty of Paying Living Expenses: Not hard at all  Food Insecurity: No Food Insecurity (06/13/2023)   Hunger Vital Sign    Worried About Running Out of Food in the Last Year: Never true    Ran Out of Food in the Last Year: Never true  Transportation Needs: No Transportation Needs (06/13/2023)   PRAPARE - Administrator, Civil Service (Medical): No    Lack of Transportation (Non-Medical): No  Physical Activity: Inactive (05/27/2023)   Exercise Vital Sign    Days of Exercise per Week: 0 days    Minutes of Exercise per Session: 0 min  Stress: No Stress Concern Present (05/27/2023)   [employer] of Occupational Health - Occupational Stress Questionnaire    Feeling of Stress : Not at all  Social Connections: Socially Isolated (05/27/2023)   Social Connection and Isolation Panel [NHANES]    Frequency of Communication with Friends and Family: More than three times a week    Frequency of Social Gatherings with Friends and Family: Once a week    Attends Religious Services: Never    Database administrator or Organizations: No    Attends Banker Meetings: Never    Marital Status: Never married  Intimate Partner Violence: Not At Risk (05/27/2023)  Humiliation, Afraid, Rape, and Kick questionnaire    Fear of Current or Ex-Partner: No    Emotionally Abused: No    Physically Abused: No    Sexually Abused: No    Family History  Problem Relation Age of Onset   Hypertension Mother    Obesity Mother    Diabetes Father    Hearing loss Father    Hypertension Father    Obesity Father    Stroke Paternal Aunt    Kidney disease Paternal Uncle    Hypertension Maternal Grandmother    Obesity Maternal Grandmother    Cancer Maternal Grandfather    Arthritis Paternal Grandmother    Diabetes Paternal Grandmother     Hypertension Paternal Grandmother    Miscarriages / Stillbirths Paternal Grandmother    Obesity Paternal Grandmother     Past Surgical History:  Procedure Laterality Date   ADENOIDECTOMY     EYE SURGERY     TUBAL LIGATION Bilateral 01/03/2021   Procedure: POST PARTUM TUBAL LIGATION;  Surgeon: Levie Heritage, DO;  Location: [facility];  Service: Gynecology;  Laterality: Bilateral;    ROS: Review of Systems Negative except as stated above  PHYSICAL EXAM: BP 131/82   Pulse (!) 101   Temp 98.4 F (36.9 C) (Oral)   Ht 5' 1.75" (1.568 m)   Wt 208 lb (94.3 kg)   LMP 06/24/2023 (Exact Date)   SpO2 98%   BMI 38.35 kg/m   Physical Exam HENT:     Head: Normocephalic and atraumatic.     Nose: Nose normal.     Mouth/Throat:     Mouth: Mucous membranes are moist.     Pharynx: Oropharynx is clear.  Eyes:     Extraocular Movements: Extraocular movements intact.     Conjunctiva/sclera: Conjunctivae normal.     Pupils: Pupils are equal, round, and reactive to light.  Cardiovascular:     Rate and Rhythm: Tachycardia present.     Pulses: Normal pulses.     Heart sounds: Normal heart sounds.  Pulmonary:     Effort: Pulmonary effort is normal.     Breath sounds: Normal breath sounds.  Musculoskeletal:        General: Normal range of motion.     Cervical back: Normal range of motion and neck supple.  Neurological:     General: No focal deficit present.     Mental Status: She is alert and oriented to person, place, and time.  Psychiatric:        Mood and Affect: Mood normal.        Behavior: Behavior normal.     ASSESSMENT AND PLAN: 1. Primary hypertension (Primary) - Continue Amlodipine and Valsartan as prescribed. No refills needed as of present.  - Counseled on blood pressure goal of less than 130/80, low-sodium, DASH diet, medication compliance, and 150 minutes of moderate intensity exercise per week as tolerated. Counseled on medication adherence and adverse effects. -  Follow-up with primary provider in 3 months or sooner if needed.   Patient was given the opportunity to ask questions.  Patient verbalized understanding of the plan and was able to repeat key elements of the plan. Patient was given clear instructions to go to Emergency Department or return to medical center if symptoms don't improve, worsen, or new problems develop.The patient verbalized understanding.   Return in about 3 months (around 09/27/2023) for Follow-Up or next available chronic conditions.  Rema Fendt, NP

## 2023-06-28 NOTE — Progress Notes (Signed)
 Patient states nothing to discuss.

## 2023-07-08 ENCOUNTER — Inpatient Hospital Stay

## 2023-07-08 ENCOUNTER — Inpatient Hospital Stay: Attending: Hematology and Oncology | Admitting: Hematology and Oncology

## 2023-07-08 VITALS — BP 143/92 | HR 102 | Temp 97.9°F | Resp 16 | Wt 213.8 lb

## 2023-07-08 DIAGNOSIS — N920 Excessive and frequent menstruation with regular cycle: Secondary | ICD-10-CM | POA: Diagnosis present

## 2023-07-08 DIAGNOSIS — D500 Iron deficiency anemia secondary to blood loss (chronic): Secondary | ICD-10-CM

## 2023-07-08 DIAGNOSIS — I10 Essential (primary) hypertension: Secondary | ICD-10-CM | POA: Insufficient documentation

## 2023-07-08 LAB — CBC WITH DIFFERENTIAL (CANCER CENTER ONLY)
Abs Immature Granulocytes: 0.02 10*3/uL (ref 0.00–0.07)
Basophils Absolute: 0 10*3/uL (ref 0.0–0.1)
Basophils Relative: 0 %
Eosinophils Absolute: 0.1 10*3/uL (ref 0.0–0.5)
Eosinophils Relative: 1 %
HCT: 37.7 % (ref 36.0–46.0)
Hemoglobin: 11.8 g/dL — ABNORMAL LOW (ref 12.0–15.0)
Immature Granulocytes: 0 %
Lymphocytes Relative: 26 %
Lymphs Abs: 2.1 10*3/uL (ref 0.7–4.0)
MCH: 23 pg — ABNORMAL LOW (ref 26.0–34.0)
MCHC: 31.3 g/dL (ref 30.0–36.0)
MCV: 73.3 fL — ABNORMAL LOW (ref 80.0–100.0)
Monocytes Absolute: 0.5 10*3/uL (ref 0.1–1.0)
Monocytes Relative: 6 %
Neutro Abs: 5.6 10*3/uL (ref 1.7–7.7)
Neutrophils Relative %: 67 %
Platelet Count: 313 10*3/uL (ref 150–400)
RBC: 5.14 MIL/uL — ABNORMAL HIGH (ref 3.87–5.11)
RDW: 17.2 % — ABNORMAL HIGH (ref 11.5–15.5)
WBC Count: 8.3 10*3/uL (ref 4.0–10.5)
nRBC: 0 % (ref 0.0–0.2)

## 2023-07-08 LAB — CMP (CANCER CENTER ONLY)
ALT: 21 U/L (ref 0–44)
AST: 19 U/L (ref 15–41)
Albumin: 4.9 g/dL (ref 3.5–5.0)
Alkaline Phosphatase: 79 U/L (ref 38–126)
Anion gap: 8 (ref 5–15)
BUN: 10 mg/dL (ref 6–20)
CO2: 25 mmol/L (ref 22–32)
Calcium: 10 mg/dL (ref 8.9–10.3)
Chloride: 103 mmol/L (ref 98–111)
Creatinine: 0.72 mg/dL (ref 0.44–1.00)
GFR, Estimated: >60 mL/min (ref >60)
Glucose, Bld: 90 mg/dL (ref 70–99)
Potassium: 3.8 mmol/L (ref 3.5–5.1)
Sodium: 136 mmol/L (ref 135–145)
Total Bilirubin: 0.4 mg/dL (ref 0.0–1.2)
Total Protein: 8.1 g/dL (ref 6.5–8.1)

## 2023-07-08 LAB — IRON AND IRON BINDING CAPACITY (CC-WL,HP ONLY)
Iron: 36 ug/dL (ref 28–170)
Saturation Ratios: 6 % — ABNORMAL LOW (ref 10.4–31.8)
TIBC: 601 ug/dL — ABNORMAL HIGH (ref 250–450)
UIBC: 565 ug/dL — ABNORMAL HIGH (ref 148–442)

## 2023-07-08 LAB — RETIC PANEL
Immature Retic Fract: 31.8 % — ABNORMAL HIGH (ref 2.3–15.9)
RBC.: 5.18 MIL/uL — ABNORMAL HIGH (ref 3.87–5.11)
Retic Count, Absolute: 119.7 10*3/uL (ref 19.0–186.0)
Retic Ct Pct: 2.3 % (ref 0.4–3.1)
Reticulocyte Hemoglobin: 28.7 pg (ref >27.9)

## 2023-07-08 LAB — FERRITIN: Ferritin: 7 ng/mL — ABNORMAL LOW (ref 11–307)

## 2023-07-08 NOTE — Progress Notes (Signed)
 [facility] [phone]   [phone]  INITIAL CONSULT NOTE  Patient Care Team: Rema Fendt, NP as PCP - General (Nurse Practitioner)  Hematological/Oncological History # Iron Deficiency Anemia 2/2 to GYN Bleeding  05/27/2023: Iron sat 10%, ferritin 11, Hgb 11.4, MCV 72  07/08/2023: establish care with Dr. Leonides Schanz   CHIEF COMPLAINTS/PURPOSE OF CONSULTATION:  "Iron Deficiency Anemia "  HISTORY OF PRESENTING ILLNESS:  Gloria Kemp 28 y.o. female with medical history significant for gestational diabetes, hypertension, and iron deficiency anemia who presents for evaluation.  On review of the previous records Ms. Austin had labs drawn on 05/27/2023 which showed iron sat 10%, ferritin 11, hemoglobin 11.4, and MCV 72.  About 2 weeks ago she started herself on p.o. iron therapy 325 mg daily.  Due to concern for her iron deficiency anemia she was referred to hematology for further evaluation and management.  On exam today Ms. Ruggiero reports that she was told she was iron deficient during her last pregnancy.  Her child is currently 35 years old.  She reports he does have heavy menstrual bleeding the last for 7 to 8 days.  She goes to about 10 pads or tampons on the heaviest days and they are soaked.  She reports that she has been referred to an OB/GYN but has not yet been able to make an appointment.  She will be reaching out to her PCP soon to assure that the referral has been placed.  She does that she does have some occasional light nosebleeds but no gum bleeding, blood in the urine or stool.  She reports that she does not have any special diets and does eat red meat, though does not eat a lot because her boyfriend recently developed gout.  She does not enjoy eating liver.  She does eat broccoli, spinach, and kale.  On further discussion the patient reports that her mother also takes iron pills.  Her father has type 2 diabetes and glaucoma.  Her maternal aunt had some  form of cancer and her maternal grandfather had lung cancer.  She has a sister who does not see the doctor.  She has 2 children ages 2 and 4 both of whom are healthy.  She is a never smoker does drink alcohol a few times a week.  She currently works at the [employer] on [employer].  She notes that she does have some occasional bouts of tiredness, shortness of breath, cramps, and dry skin.  For the last 2-week she has been taking iron pills without any stomach upset.  She has been taking [place] brand and tolerating it well.  She otherwise denies any fevers, chills, sweats, nausea, vomiting or diarrhea.  Full 10 point ROS is otherwise negative.  MEDICAL HISTORY:  Past Medical History:  Diagnosis Date   Gestational diabetes 10/27/2018   Possible VSD seen on fetal echo.  Current Diabetic Medications:  None  [x]  Aspirin 81 mg daily after 12 weeks (? A2/B GDM)  Required Referrals for A1GDM or A2GDM: [x]  Diabetes Education and Testing Supplies [x]  Nutrition Cousult  For A2/B GDM or higher classes of DM [ ]  Diabetes Education and Testing Supplies [ ]  Nutrition Counsult [ ]  Fetal ECHO after 20 weeks  [ ]  Eye exam for retina evaluation    Hypertension    IUGR (intrauterine growth restriction) affecting care of mother 02/12/2019   02/11/19: 8th%tile    Follow up UA dopplers in 2 weeks  NST in 1  week  Repeat growth in 3-4 weeks.  Initiate BPP at 32 weeks.  Guidelines for Antenatal Testing and Sonography  (with updated ICD-10 codes)  Updated 2018-10-18 with Dr. Noralee Space  IUGR- [phone]    EFW < 10%, nml Dopplers & AFV, AC<3%, - other comorbidities        EFW < 10% w/ AEDF & low AFV or EFW < 3%   Q 3-4wks   As per MFM  At d   Supervision of normal first pregnancy 09/16/2018    Nursing Staff Provider Office Location  [facility] Dating  LMP Language   English Anatomy US  Normal, but possible VSD on echo, repeat echo normal Flu Vaccine  Declined 12-11-18 Genetic Screen  NIPS:  Insufficient fetal fraction,  declined testing  TDaP vaccine   Declined 03/06/19 Hgb A1C or  GTT Early: 5.7 Third trimester Failed 2 hr on 10/24/18 Rhogam   n/a   LAB RESULTS  Feeding Plan  Breastfeed     SURGICAL HISTORY: Past Surgical History:  Procedure Laterality Date   ADENOIDECTOMY     EYE SURGERY     TUBAL LIGATION Bilateral 01/03/2021   Procedure: POST PARTUM TUBAL LIGATION;  Surgeon: Levie Heritage, DO;  Location: [facility] LD [facility];  Service: Gynecology;  Laterality: Bilateral;    SOCIAL HISTORY: Social History   Socioeconomic History   Marital status: Single    Spouse name: Not on file   Number of children: 0   Years of education: Not on file   Highest education level: Not on file  Occupational History   Occupation: [employer]  Tobacco Use   Smoking status: Never   Smokeless tobacco: Never  Vaping Use   Vaping status: Never Used  Substance and Sexual Activity   Alcohol use: Yes    Comment: Occassionally.   Drug use: Never   Sexual activity: Yes    Birth control/protection: None  Other Topics Concern   Not on file  Social History Narrative   Not on file   Social Drivers of Health   Financial Resource Strain: Low Risk  (05/27/2023)   Overall Financial Resource Strain (CARDIA)    Difficulty of Paying Living Expenses: Not hard at all  Food Insecurity: No Food Insecurity (06/13/2023)   Hunger Vital Sign    Worried About Running Out of Food in the Last Year: Never true    Ran Out of Food in the Last Year: Never true  Transportation Needs: No Transportation Needs (06/13/2023)   PRAPARE - Administrator, Civil Service (Medical): No    Lack of Transportation (Non-Medical): No  Physical Activity: Inactive (05/27/2023)   Exercise Vital Sign    Days of Exercise per Week: 0 days    Minutes of Exercise per Session: 0 min  Stress: No Stress Concern Present (05/27/2023)   [employer] of Occupational Health - Occupational Stress Questionnaire    Feeling of Stress : Not at all  Social  Connections: Socially Isolated (05/27/2023)   Social Connection and Isolation Panel [NHANES]    Frequency of Communication with Friends and Family: More than three times a week    Frequency of Social Gatherings with Friends and Family: Once a week    Attends Religious Services: Never    Database administrator or Organizations: No    Attends Banker Meetings: Never    Marital Status: Never married  Intimate Partner Violence: Not At Risk (05/27/2023)   Humiliation, Afraid, Rape, and Kick questionnaire  Fear of Current or Ex-Partner: No    Emotionally Abused: No    Physically Abused: No    Sexually Abused: No    FAMILY HISTORY: Family History  Problem Relation Age of Onset   Hypertension Mother    Obesity Mother    Diabetes Father    Hearing loss Father    Hypertension Father    Obesity Father    Stroke Paternal Aunt    Kidney disease Paternal Uncle    Hypertension Maternal Grandmother    Obesity Maternal Grandmother    Cancer Maternal Grandfather    Arthritis Paternal Grandmother    Diabetes Paternal Grandmother    Hypertension Paternal Grandmother    Miscarriages / Stillbirths Paternal Grandmother    Obesity Paternal Grandmother     ALLERGIES:  has no known allergies.  MEDICATIONS:  Current Outpatient Medications  Medication Sig Dispense Refill   ferrous sulfate 324 MG TBEC Take 324 mg by mouth daily with breakfast.     amLODipine (NORVASC) 10 MG tablet Take 1 tablet (10 mg total) by mouth daily. 90 tablet 0   valsartan (DIOVAN) 80 MG tablet Take 1 tablet (80 mg total) by mouth daily. 90 tablet 0   No current facility-administered medications for this visit.    REVIEW OF SYSTEMS:   Constitutional: ( - ) fevers, ( - )  chills , ( - ) night sweats Eyes: ( - ) blurriness of vision, ( - ) double vision, ( - ) watery eyes Ears, nose, mouth, throat, and face: ( - ) mucositis, ( - ) sore throat Respiratory: ( - ) cough, ( - ) dyspnea, ( - )  wheezes Cardiovascular: ( - ) palpitation, ( - ) chest discomfort, ( - ) lower extremity swelling Gastrointestinal:  ( - ) nausea, ( - ) heartburn, ( - ) change in bowel habits Skin: ( - ) abnormal skin rashes Lymphatics: ( - ) new lymphadenopathy, ( - ) easy bruising Neurological: ( - ) numbness, ( - ) tingling, ( - ) new weaknesses Behavioral/Psych: ( - ) mood change, ( - ) new changes  All other systems were reviewed with the patient and are negative.  PHYSICAL EXAMINATION:  Vitals:   07/08/23 1328  BP: (!) 143/92  Pulse: (!) 102  Resp: 16  Temp: 97.9 F (36.6 C)  SpO2: 100%   Filed Weights   07/08/23 1328  Weight: 213 lb 12.8 oz (97 kg)    GENERAL: well appearing young African-American female in NAD  SKIN: skin color, texture, turgor are normal, no rashes or significant lesions EYES: conjunctiva are pink and non-injected, sclera clear LUNGS: clear to auscultation and percussion with normal breathing effort HEART: regular rate & rhythm and no murmurs and no lower extremity edema Musculoskeletal: no cyanosis of digits and no clubbing  PSYCH: alert & oriented x 3, fluent speech NEURO: no focal motor/sensory deficits  LABORATORY DATA:  I have reviewed the data as listed    Latest Ref Rng & Units 07/08/2023    2:12 PM 05/27/2023    1:28 PM 07/20/2021    2:13 PM  CBC  WBC 4.0 - 10.5 K/uL 8.3  9.6  7.9   Hemoglobin 12.0 - 15.0 g/dL 16.1  09.6  04.5   Hematocrit 36.0 - 46.0 % 37.7  36.6  35.7   Platelets 150 - 400 K/uL 313  296  276.0        Latest Ref Rng & Units 07/08/2023    2:12 PM 06/25/2023  1:19 PM 05/27/2023    1:28 PM  CMP  Glucose 70 - 99 mg/dL 90  161  90   BUN 6 - 20 mg/dL 10  12  11    Creatinine 0.44 - 1.00 mg/dL 0.96  0.45  4.09   Sodium 135 - 145 mmol/L 136  137  135   Potassium 3.5 - 5.1 mmol/L 3.8  4.1  4.2   Chloride 98 - 111 mmol/L 103  103  102   CO2 22 - 32 mmol/L 25  19  21    Calcium 8.9 - 10.3 mg/dL 81.1  9.7  9.5   Total Protein 6.5 - 8.1  g/dL 8.1  7.0    Total Bilirubin 0.0 - 1.2 mg/dL 0.4  <9.1    Alkaline Phos 38 - 126 U/L 79  97    AST 15 - 41 U/L 19  20    ALT 0 - 44 U/L 21  17       ASSESSMENT & PLAN Tambi Grizzle 28 y.o. female with medical history significant for gestational diabetes, hypertension, and iron deficiency anemia who presents for evaluation.  After review of the labs, review of the records, and discussion with the patient the patients findings are most consistent with iron deficiency anemia secondary to GYN bleeding.  # Iron Deficiency Anemia 2/2 to GYN Bleeding -- Findings are consistent with iron deficiency anemia secondary to patient's menorrhagia --Encouraged her to follow-up with OB/GYN for better control of her menstrual cycles --We will confirm iron deficiency anemia by ordering iron panel and ferritin as well as reticulocytes, CBC, and CMP --Continue ferrous sulfate 325 mg daily with a source of vitamin C --We will plan to proceed with IV iron therapy in order to help bolster the patient's blood counts if p.o. therapy does not adequately replete her levels.  Patient notes that she would like to pursue p.o. iron therapy first before trying IV iron. --Plan for return to clinic in 3 months with labs 1 week before to ensure her p.o. iron therapy is adequate.   Orders Placed This Encounter  Procedures   CBC with Differential (Cancer Center Only)    Standing Status:   Future    Number of Occurrences:   1    Expiration Date:   07/07/2024   CMP (Cancer Center only)    Standing Status:   Future    Number of Occurrences:   1    Expiration Date:   07/07/2024   Ferritin    Standing Status:   Future    Number of Occurrences:   1    Expiration Date:   07/07/2024   Iron and Iron Binding Capacity ([facility] only)    Standing Status:   Future    Number of Occurrences:   1    Expiration Date:   07/07/2024   Retic Panel    Standing Status:   Future    Number of Occurrences:   1    Expiration Date:    07/07/2024    All questions were answered. The patient knows to call the clinic with any problems, questions or concerns.  A total of more than 60 minutes were spent on this encounter with face-to-face time and non-face-to-face time, including preparing to see the patient, ordering tests and/or medications, counseling the patient and coordination of care as outlined above.   Ulysees Barns, MD Department of Hematology/Oncology [facility] Phone: [phone] Pager: [phone] Email: [email or web address]  07/08/2023  4:44 PM

## 2023-08-09 ENCOUNTER — Other Ambulatory Visit

## 2023-08-09 DIAGNOSIS — I10 Essential (primary) hypertension: Secondary | ICD-10-CM

## 2023-08-09 DIAGNOSIS — Z13220 Encounter for screening for lipoid disorders: Secondary | ICD-10-CM

## 2023-08-09 MED ORDER — VALSARTAN 80 MG PO TABS
80.0000 mg | ORAL_TABLET | Freq: Every day | ORAL | 0 refills | Status: DC
Start: 2023-08-09 — End: 2023-10-04

## 2023-08-09 MED ORDER — AMLODIPINE BESYLATE 10 MG PO TABS
10.0000 mg | ORAL_TABLET | Freq: Every day | ORAL | 0 refills | Status: DC
Start: 2023-08-09 — End: 2023-10-04

## 2023-08-10 LAB — LIPID PANEL
Chol/HDL Ratio: 3.7 ratio (ref 0.0–4.4)
Cholesterol, Total: 179 mg/dL (ref 100–199)
HDL: 49 mg/dL (ref >39)
LDL Chol Calc (NIH): 115 mg/dL — ABNORMAL HIGH (ref 0–99)
Triglycerides: 79 mg/dL (ref 0–149)
VLDL Cholesterol Cal: 15 mg/dL (ref 5–40)

## 2023-08-12 ENCOUNTER — Ambulatory Visit: Payer: Self-pay | Admitting: Family

## 2023-09-05 ENCOUNTER — Ambulatory Visit: Payer: Self-pay | Admitting: Obstetrics and Gynecology

## 2023-09-05 ENCOUNTER — Other Ambulatory Visit (HOSPITAL_COMMUNITY)
Admission: RE | Admit: 2023-09-05 | Discharge: 2023-09-05 | Disposition: A | Discharge status: Home / Self Care | Source: Ambulatory Visit | Attending: Obstetrics and Gynecology | Admitting: Obstetrics and Gynecology

## 2023-09-05 ENCOUNTER — Encounter: Payer: Self-pay | Admitting: Obstetrics and Gynecology

## 2023-09-05 ENCOUNTER — Ambulatory Visit: Admitting: Obstetrics and Gynecology

## 2023-09-05 VITALS — BP 124/80 | HR 113 | Temp 98.6°F | Ht 63.0 in | Wt 211.0 lb

## 2023-09-05 DIAGNOSIS — E669 Obesity, unspecified: Secondary | ICD-10-CM

## 2023-09-05 DIAGNOSIS — Z124 Encounter for screening for malignant neoplasm of cervix: Secondary | ICD-10-CM | POA: Insufficient documentation

## 2023-09-05 DIAGNOSIS — Z1331 Encounter for screening for depression: Secondary | ICD-10-CM | POA: Diagnosis not present

## 2023-09-05 DIAGNOSIS — Z01419 Encounter for gynecological examination (general) (routine) without abnormal findings: Secondary | ICD-10-CM | POA: Diagnosis not present

## 2023-09-05 DIAGNOSIS — N920 Excessive and frequent menstruation with regular cycle: Secondary | ICD-10-CM | POA: Diagnosis not present

## 2023-09-05 DIAGNOSIS — B3731 Acute candidiasis of vulva and vagina: Secondary | ICD-10-CM | POA: Diagnosis not present

## 2023-09-05 DIAGNOSIS — R87610 Atypical squamous cells of undetermined significance on cytologic smear of cervix (ASC-US): Secondary | ICD-10-CM

## 2023-09-05 DIAGNOSIS — N898 Other specified noninflammatory disorders of vagina: Secondary | ICD-10-CM

## 2023-09-05 LAB — WET PREP FOR TRICH, YEAST, CLUE

## 2023-09-05 MED ORDER — NORETHINDRONE ACETATE 5 MG PO TABS
5.0000 mg | ORAL_TABLET | Freq: Every day | ORAL | 3 refills | Status: DC
Start: 2023-09-05 — End: 2023-10-14

## 2023-09-05 MED ORDER — FLUCONAZOLE 150 MG PO TABS
150.0000 mg | ORAL_TABLET | Freq: Once | ORAL | 0 refills | Status: AC
Start: 2023-09-05 — End: 2023-09-05

## 2023-09-05 NOTE — Assessment & Plan Note (Signed)
 Encouraged healthy diet and exercise.

## 2023-09-05 NOTE — Assessment & Plan Note (Signed)
 Cervical cancer screening performed according to ASCCP guidelines. Labs and immunizations with her primary Encouraged safe sexual practices as indicated Encouraged healthy lifestyle practices with diet and exercise For patients under 27yo, I recommend 1000mg  calcium daily and 600IU of vitamin D daily.

## 2023-09-05 NOTE — Patient Instructions (Signed)

## 2023-09-05 NOTE — Progress Notes (Signed)
 28 y.o. [id number] female s/p BTL with HTN (2 antihypertensives), preDM, IDA here for new patient annual exam. Single, long time boyfriend. Works at [employer].  Patient's last menstrual period was 08/05/2023 (approximate). Period Duration (Days): 7-8 Period Pattern: Regular Menstrual Flow: Heavy Menstrual Control: Maxi pad Dysmenorrhea: (!) Moderate Dysmenorrhea Symptoms: Nausea  She reports Heavy cycles. Using 10 pads with clots on heavy days. Sometimes 3d, sometimes full 7d. She also notes vaginal itching, discharge x 3 days. Hx of BV. 07/08/23 Hgb 11.8, low ferritin 06/25/23 TSH wnl  Abnormal bleeding: as noted Pelvic discharge or pain: no Breast mass, nipple discharge or skin changes : no  Sexually active: yes  Birth control: BTL Last PAP:     Component Value Date/Time   DIAGPAP  10/07/2018 0000    NEGATIVE FOR INTRAEPITHELIAL LESIONS OR MALIGNANCY.   ADEQPAP  10/07/2018 0000    Satisfactory for evaluation  endocervical/transformation zone component PRESENT.   Gardasil: unsure, will request records  Exercising: no Smoker: no  Garment/textile technologist Visit from [facility]/05/[facility] in [facility]  PHQ-2 Total Score 0    Flowsheet Row Routine Prenatal from 08/08/2020 in [facility]  PHQ-9 Total Score 0     GYN HISTORY: Preeclampsia  OB History  Gravida Para Term Preterm AB Living  2 2 2   2   SAB IAB Ectopic Multiple Live Births     0 2    # Outcome Date GA Lbr Len/2nd Weight Sex Type Anes PTL Lv  2 Term 01/02/21 [redacted]w[redacted]d 08:32 / 00:05 5 lb 15.9 oz (2.72 kg) F Vag-Spont None  LIV  1 Term 04/05/19 [redacted]w[redacted]d 08:03 / 00:02 4 lb 14.1 oz (2.214 kg) M Vag-Spont None  LIV   Past Medical History:  Diagnosis Date   Chronic hypertension with superimposed pre-eclampsia 01/02/2021   Gestational diabetes 10/27/2018   Possible VSD seen on fetal echo.  Current Diabetic Medications:  None  [x]  Aspirin  81 mg daily after 12  weeks (? A2/B GDM)  Required Referrals for A1GDM or A2GDM: [x]  Diabetes Education and Testing Supplies [x]  Nutrition Cousult  For A2/B GDM or higher classes of DM [ ]  Diabetes Education and Testing Supplies [ ]  Nutrition Counsult [ ]  Fetal ECHO after 20 weeks  [ ]  Eye exam for retina evaluation    History of gestational diabetes 06/28/2020   History of prior pregnancy with IUGR newborn 06/28/2020   Hypertension    IUGR (intrauterine growth restriction) affecting care of mother 02/12/2019   02/11/19: 8th%tile    Follow up UA dopplers in 2 weeks  NST in 1 week  Repeat growth in 3-4 weeks.  Initiate BPP at 32 weeks.  Guidelines for Antenatal Testing and Sonography  (with updated ICD-10 codes)  Updated November 07, 2018 with Dr. Cassandria Clever  IUGR- [phone]    EFW < 10%, nml Dopplers & AFV, AC<3%, - other comorbidities        EFW < 10% w/ AEDF & low AFV or EFW < 3%   Q 3-4wks   As per MFM  At d   Supervision of normal first pregnancy 09/16/2018    Nursing Staff Provider Office Location  [facility] Dating  LMP Language   English Anatomy US   Normal, but possible VSD on echo, repeat echo normal Flu Vaccine  Declined 12-11-18 Genetic Screen  NIPS:  Insufficient fetal fraction, declined testing  TDaP vaccine   Declined 03/06/19 Hgb A1C or  GTT Early:  5.7 Third trimester Failed 2 hr on 10/24/18 Rhogam   n/a   LAB RESULTS  Feeding Plan  Breastfeed    Past Surgical History:  Procedure Laterality Date   ADENOIDECTOMY     EYE SURGERY     TUBAL LIGATION Bilateral 01/03/2021   Procedure: POST PARTUM TUBAL LIGATION;  Surgeon: Malka Sea, DO;  Location: [facility] LD ORS;  Service: Gynecology;  Laterality: Bilateral;   Current Outpatient Medications on File Prior to Visit  Medication Sig Dispense Refill   amLODipine  (NORVASC ) 10 MG tablet Take 1 tablet (10 mg total) by mouth daily. 90 tablet 0   ferrous sulfate 324 MG TBEC Take 324 mg by mouth daily with breakfast.     valsartan  (DIOVAN ) 80 MG tablet Take 1 tablet (80 mg  total) by mouth daily. 90 tablet 0   No current facility-administered medications on file prior to visit.   Social History   Socioeconomic History   Marital status: Single    Spouse name: Not on file   Number of children: 0   Years of education: Not on file   Highest education level: Not on file  Occupational History   Occupation: [employer]  Tobacco Use   Smoking status: Never   Smokeless tobacco: Never  Vaping Use   Vaping status: Never Used  Substance and Sexual Activity   Alcohol use: Yes    Comment: Occassionally.   Drug use: Never   Sexual activity: Yes    Birth control/protection: None, Surgical    Comment: BTL  Other Topics Concern   Not on file  Social History Narrative   Not on file   Social Drivers of Health   Financial Resource Strain: Low Risk  (05/27/2023)   Overall Financial Resource Strain (CARDIA)    Difficulty of Paying Living Expenses: Not hard at all  Food Insecurity: No Food Insecurity (06/13/2023)   Hunger Vital Sign    Worried About Running Out of Food in the Last Year: Never true    Ran Out of Food in the Last Year: Never true  Transportation Needs: No Transportation Needs (06/13/2023)   PRAPARE - Administrator, Civil Service (Medical): No    Lack of Transportation (Non-Medical): No  Physical Activity: Inactive (05/27/2023)   Exercise Vital Sign    Days of Exercise per Week: 0 days    Minutes of Exercise per Session: 0 min  Stress: No Stress Concern Present (05/27/2023)   [employer] of Occupational Health - Occupational Stress Questionnaire    Feeling of Stress : Not at all  Social Connections: Socially Isolated (05/27/2023)   Social Connection and Isolation Panel    Frequency of Communication with Friends and Family: More than three times a week    Frequency of Social Gatherings with Friends and Family: Once a week    Attends Religious Services: Never    Database administrator or Organizations: No    Attends Banker  Meetings: Never    Marital Status: Never married  Intimate Partner Violence: Not At Risk (05/27/2023)   Humiliation, Afraid, Rape, and Kick questionnaire    Fear of Current or Ex-Partner: No    Emotionally Abused: No    Physically Abused: No    Sexually Abused: No   Family History  Problem Relation Age of Onset   Hypertension Mother    Obesity Mother    Diabetes Father    Hearing loss Father    Hypertension Father  Obesity Father    Stroke Paternal Aunt    Kidney disease Paternal Uncle    Hypertension Maternal Grandmother    Obesity Maternal Grandmother    Cancer Maternal Grandfather    Arthritis Paternal Grandmother    Diabetes Paternal Grandmother    Hypertension Paternal Grandmother    Miscarriages / Stillbirths Paternal Grandmother    Obesity Paternal Grandmother    No Known Allergies  PE Today's Vitals   09/05/23 1359  BP: 124/80  Pulse: (!) 113  Temp: 98.6 F (37 C)  TempSrc: Oral  SpO2: 99%  Weight: 211 lb (95.7 kg)  Height: 5' 3 (1.6 m)   Body mass index is 37.38 kg/m.  Physical Exam Vitals reviewed. Exam conducted with a chaperone present.  Constitutional:      General: She is not in acute distress.    Appearance: Normal appearance.  HENT:     Head: Normocephalic and atraumatic.     Nose: Nose normal.   Eyes:     Extraocular Movements: Extraocular movements intact.     Conjunctiva/sclera: Conjunctivae normal.   Neck:     Thyroid: No thyroid mass, thyromegaly or thyroid tenderness.  Pulmonary:     Effort: Pulmonary effort is normal.  Chest:     Chest wall: No mass or tenderness.  Breasts:    Right: Normal. No swelling, mass, nipple discharge, skin change or tenderness.     Left: Normal. No swelling, mass, nipple discharge, skin change or tenderness.  Abdominal:     General: There is no distension.     Palpations: Abdomen is soft.     Tenderness: There is no abdominal tenderness.  Genitourinary:    General: Normal vulva.     Exam  position: Lithotomy position.     Urethra: No prolapse.     Vagina: Normal. No vaginal discharge or bleeding.     Cervix: Normal. No lesion.     Uterus: Normal. Not enlarged and not tender.      Adnexa: Right adnexa normal and left adnexa normal.   Musculoskeletal:        General: Normal range of motion.     Cervical back: Normal range of motion.  Lymphadenopathy:     Upper Body:     Right upper body: No axillary adenopathy.     Left upper body: No axillary adenopathy.     Lower Body: No right inguinal adenopathy. No left inguinal adenopathy.   Skin:    General: Skin is warm and dry.   Neurological:     General: No focal deficit present.     Mental Status: She is alert.   Psychiatric:        Mood and Affect: Mood normal.        Behavior: Behavior normal.      Assessment and Plan:        Well woman exam with routine gynecological exam Assessment & Plan: Cervical cancer screening performed according to ASCCP guidelines. Labs and immunizations with her primary Encouraged safe sexual practices as indicated Encouraged healthy lifestyle practices with diet and exercise For patients under 50yo, I recommend 1000mg  calcium daily and 600IU of vitamin D daily.  Cervical cancer screening -     Cytology - PAP  Vaginal discharge -     WET PREP FOR TRICH, YEAST, CLUE  Menorrhagia with regular cycle TSH wnl, IDA, Hgg 11.8 -     Norethindrone Acetate; Take 1 tablet (5 mg total) by mouth daily for 5 days.  Dispense:  90 tablet; Refill: 3 -     US  PELVIS TRANSVAGINAL NON-OB (TV ONLY); Future  Obesity (BMI 30-39.9) Assessment & Plan: Encouraged healthy diet and exercise.  Yeast vaginitis Diflucan  Negative depression screen  RTO in  3 months Gloria Tallman Hadassah Letters, MD

## 2023-09-10 ENCOUNTER — Telehealth: Payer: Self-pay | Admitting: *Deleted

## 2023-09-10 ENCOUNTER — Other Ambulatory Visit: Payer: Self-pay | Admitting: Family

## 2023-09-10 DIAGNOSIS — D229 Melanocytic nevi, unspecified: Secondary | ICD-10-CM

## 2023-09-10 NOTE — Telephone Encounter (Signed)
 Referral to Dermatology for evaluation/management. Expect call soon with appointment details.

## 2023-09-10 NOTE — Telephone Encounter (Signed)
 Pt needs a mole removed from her armpit. Pt asking if there is someone or somewhere that the pcp would recommend she go to for mole removal

## 2023-09-10 NOTE — Telephone Encounter (Signed)
 I called patient with recommendations from PCP.  No one answer so I left a voicemail to return my call.

## 2023-09-11 LAB — CYTOLOGY - PAP
Comment: NEGATIVE
Comment: NEGATIVE
Comment: NEGATIVE
Diagnosis: UNDETERMINED — AB
HPV 16: NEGATIVE
HPV 18 / 45: NEGATIVE
High risk HPV: POSITIVE — AB

## 2023-09-17 ENCOUNTER — Telehealth: Payer: Self-pay

## 2023-09-17 NOTE — Telephone Encounter (Signed)
 I called patient back during times she asked she did not answer. I left her a message to call us  back.

## 2023-09-17 NOTE — Telephone Encounter (Signed)
 Patient called & left message on triage voicemail asking for a callback. She asked to be called back from 12-2. Patient had abnormal pap

## 2023-09-17 NOTE — Telephone Encounter (Signed)
 Opened in error

## 2023-09-19 NOTE — Addendum Note (Signed)
 Addended by: BRUTUS KATE SAILOR on: 09/19/2023 09:03 AM   Modules accepted: Orders

## 2023-09-19 NOTE — Telephone Encounter (Signed)
See result note. Encounter closed.

## 2023-10-01 ENCOUNTER — Inpatient Hospital Stay: Attending: Hematology and Oncology

## 2023-10-01 ENCOUNTER — Other Ambulatory Visit: Payer: Self-pay | Admitting: Hematology and Oncology

## 2023-10-01 DIAGNOSIS — D500 Iron deficiency anemia secondary to blood loss (chronic): Secondary | ICD-10-CM | POA: Insufficient documentation

## 2023-10-01 DIAGNOSIS — N920 Excessive and frequent menstruation with regular cycle: Secondary | ICD-10-CM | POA: Diagnosis present

## 2023-10-01 LAB — CMP (CANCER CENTER ONLY)
ALT: 22 U/L (ref 0–44)
AST: 18 U/L (ref 15–41)
Albumin: 4.5 g/dL (ref 3.5–5.0)
Alkaline Phosphatase: 73 U/L (ref 38–126)
Anion gap: 6 (ref 5–15)
BUN: 10 mg/dL (ref 6–20)
CO2: 23 mmol/L (ref 22–32)
Calcium: 9.5 mg/dL (ref 8.9–10.3)
Chloride: 108 mmol/L (ref 98–111)
Creatinine: 0.73 mg/dL (ref 0.44–1.00)
GFR, Estimated: >60 mL/min (ref >60)
Glucose, Bld: 84 mg/dL (ref 70–99)
Potassium: 4 mmol/L (ref 3.5–5.1)
Sodium: 137 mmol/L (ref 135–145)
Total Bilirubin: 0.3 mg/dL (ref 0.0–1.2)
Total Protein: 7.4 g/dL (ref 6.5–8.1)

## 2023-10-01 LAB — CBC WITH DIFFERENTIAL (CANCER CENTER ONLY)
Abs Immature Granulocytes: 0.02 K/uL (ref 0.00–0.07)
Basophils Absolute: 0 K/uL (ref 0.0–0.1)
Basophils Relative: 1 %
Eosinophils Absolute: 0.2 K/uL (ref 0.0–0.5)
Eosinophils Relative: 3 %
HCT: 38.8 % (ref 36.0–46.0)
Hemoglobin: 12.8 g/dL (ref 12.0–15.0)
Immature Granulocytes: 0 %
Lymphocytes Relative: 34 %
Lymphs Abs: 2 K/uL (ref 0.7–4.0)
MCH: 25.6 pg — ABNORMAL LOW (ref 26.0–34.0)
MCHC: 33 g/dL (ref 30.0–36.0)
MCV: 77.6 fL — ABNORMAL LOW (ref 80.0–100.0)
Monocytes Absolute: 0.5 K/uL (ref 0.1–1.0)
Monocytes Relative: 8 %
Neutro Abs: 3.3 K/uL (ref 1.7–7.7)
Neutrophils Relative %: 54 %
Platelet Count: 300 K/uL (ref 150–400)
RBC: 5 MIL/uL (ref 3.87–5.11)
RDW: 14.2 % (ref 11.5–15.5)
WBC Count: 5.9 K/uL (ref 4.0–10.5)
nRBC: 0 % (ref 0.0–0.2)

## 2023-10-01 LAB — IRON AND IRON BINDING CAPACITY (CC-WL,HP ONLY)
Iron: 48 ug/dL (ref 28–170)
Saturation Ratios: 10 % — ABNORMAL LOW (ref 10.4–31.8)
TIBC: 491 ug/dL — ABNORMAL HIGH (ref 250–450)
UIBC: 443 ug/dL — ABNORMAL HIGH (ref 148–442)

## 2023-10-01 LAB — RETIC PANEL
Immature Retic Fract: 18.6 % — ABNORMAL HIGH (ref 2.3–15.9)
RBC.: 5.04 MIL/uL (ref 3.87–5.11)
Retic Count, Absolute: 109.9 K/uL (ref 19.0–186.0)
Retic Ct Pct: 2.2 % (ref 0.4–3.1)
Reticulocyte Hemoglobin: 31.2 pg (ref >27.9)

## 2023-10-01 LAB — FERRITIN: Ferritin: 36 ng/mL (ref 11–307)

## 2023-10-04 ENCOUNTER — Ambulatory Visit (INDEPENDENT_AMBULATORY_CARE_PROVIDER_SITE_OTHER): Admitting: Family

## 2023-10-04 ENCOUNTER — Encounter: Payer: Self-pay | Admitting: Family

## 2023-10-04 VITALS — BP 138/87 | Temp 98.6°F | Resp 16 | Ht 62.0 in | Wt 212.4 lb

## 2023-10-04 DIAGNOSIS — I10 Essential (primary) hypertension: Secondary | ICD-10-CM | POA: Diagnosis not present

## 2023-10-04 MED ORDER — VALSARTAN 80 MG PO TABS: 80.0000 mg | ORAL_TABLET | Freq: Every day | ORAL | 0 refills | Status: DC

## 2023-10-04 MED ORDER — AMLODIPINE BESYLATE 10 MG PO TABS: 10.0000 mg | ORAL_TABLET | Freq: Every day | ORAL | 0 refills | Status: DC

## 2023-10-04 NOTE — Progress Notes (Signed)
 Patient ID: Gloria Kemp, female    DOB: 03-Nov-1995  MRN: [id number]  CC: Chronic Conditions Follow-Up  Subjective: Gloria Kemp is a 28 y.o. female who presents for chronic conditions follow-up.   Her concerns today include:  Doing well on Amlodipine  and Valsartan , no issues/concerns. She does not complain of red flag symptoms such as but not limited to chest pain, shortness of breath, worst headache of life, nausea/vomiting.   Patient Active Problem List   Diagnosis Date Noted   Well woman exam with routine gynecological exam 09/05/2023   Menorrhagia with regular cycle 09/05/2023   Proteinuria 07/20/2021   Uncontrolled hypertension 07/20/2021   Obesity (BMI 30-39.9) 07/20/2021   Prediabetes 08/08/2020   Child with VSD (ventricular septal defect) 08/08/2020   Alpha thalassemia silent carrier 10/16/2018     Current Outpatient Medications on File Prior to Visit  Medication Sig Dispense Refill   ferrous sulfate 324 MG TBEC Take 324 mg by mouth daily with breakfast.     norethindrone  (AYGESTIN ) 5 MG tablet Take 1 tablet (5 mg total) by mouth daily for 5 days. (Patient not taking: Reported on 10/04/2023) 90 tablet 3   No current facility-administered medications on file prior to visit.    No Known Allergies  Social History   Socioeconomic History   Marital status: Single    Spouse name: Not on file   Number of children: 0   Years of education: Not on file   Highest education level: Not on file  Occupational History   Occupation: [employer]  Tobacco Use   Smoking status: Never   Smokeless tobacco: Never  Vaping Use   Vaping status: Never Used  Substance and Sexual Activity   Alcohol use: Yes    Comment: Occassionally.   Drug use: Never   Sexual activity: Yes    Birth control/protection: None, Surgical    Comment: BTL  Other Topics Concern   Not on file  Social History Narrative   Not on file   Social Drivers of Health   Financial Resource Strain: Low Risk   (05/27/2023)   Overall Financial Resource Strain (CARDIA)    Difficulty of Paying Living Expenses: Not hard at all  Food Insecurity: No Food Insecurity (06/13/2023)   Hunger Vital Sign    Worried About Running Out of Food in the Last Year: Never true    Ran Out of Food in the Last Year: Never true  Transportation Needs: No Transportation Needs (06/13/2023)   PRAPARE - Administrator, Civil Service (Medical): No    Lack of Transportation (Non-Medical): No  Physical Activity: Inactive (05/27/2023)   Exercise Vital Sign    Days of Exercise per Week: 0 days    Minutes of Exercise per Session: 0 min  Stress: No Stress Concern Present (05/27/2023)   [employer] of Occupational Health - Occupational Stress Questionnaire    Feeling of Stress : Not at all  Social Connections: Socially Isolated (05/27/2023)   Social Connection and Isolation Panel    Frequency of Communication with Friends and Family: More than three times a week    Frequency of Social Gatherings with Friends and Family: Once a week    Attends Religious Services: Never    Database administrator or Organizations: No    Attends Banker Meetings: Never    Marital Status: Never married  Intimate Partner Violence: Not At Risk (05/27/2023)   Humiliation, Afraid, Rape, and Kick questionnaire    Fear  of Current or Ex-Partner: No    Emotionally Abused: No    Physically Abused: No    Sexually Abused: No    Family History  Problem Relation Age of Onset   Hypertension Mother    Obesity Mother    Diabetes Father    Hearing loss Father    Hypertension Father    Obesity Father    Stroke Paternal Aunt    Kidney disease Paternal Uncle    Hypertension Maternal Grandmother    Obesity Maternal Grandmother    Cancer Maternal Grandfather    Arthritis Paternal Grandmother    Diabetes Paternal Grandmother    Hypertension Paternal Grandmother    Miscarriages / Stillbirths Paternal Grandmother    Obesity Paternal  Grandmother     Past Surgical History:  Procedure Laterality Date   ADENOIDECTOMY     EYE SURGERY     TUBAL LIGATION Bilateral 01/03/2021   Procedure: POST PARTUM TUBAL LIGATION;  Surgeon: Barbra Lang PARAS, DO;  Location: [facility];  Service: Gynecology;  Laterality: Bilateral;    ROS: Review of Systems Negative except as stated above  PHYSICAL EXAM: BP 138/87   Temp 98.6 F (37 C) (Oral)   Resp 16   Ht 5' 2 (1.575 m)   Wt 212 lb 6.4 oz (96.3 kg)   LMP 10/02/2023 (Exact Date)   SpO2 98%   BMI 38.85 kg/m   Physical Exam HENT:     Head: Normocephalic and atraumatic.     Nose: Nose normal.     Mouth/Throat:     Mouth: Mucous membranes are moist.     Pharynx: Oropharynx is clear.  Eyes:     Extraocular Movements: Extraocular movements intact.     Conjunctiva/sclera: Conjunctivae normal.     Pupils: Pupils are equal, round, and reactive to light.  Cardiovascular:     Rate and Rhythm: Normal rate and regular rhythm.     Pulses: Normal pulses.     Heart sounds: Normal heart sounds.  Pulmonary:     Effort: Pulmonary effort is normal.     Breath sounds: Normal breath sounds.  Musculoskeletal:        General: Normal range of motion.     Cervical back: Normal range of motion and neck supple.  Neurological:     General: No focal deficit present.     Mental Status: She is alert and oriented to person, place, and time.  Psychiatric:        Mood and Affect: Mood normal.        Behavior: Behavior normal.     ASSESSMENT AND PLAN: 1. Primary hypertension (Primary) - Continue Amlodipine  and Valsartan  as prescribed. - Counseled on blood pressure goal of less than 130/80, low-sodium, DASH diet, medication compliance, and 150 minutes of moderate intensity exercise per week as tolerated. Counseled on medication adherence and adverse effects. - Follow-up with primary provider in 3 months or sooner if needed. - amLODipine  (NORVASC ) 10 MG tablet; Take 1 tablet (10 mg total) by  mouth daily.  Dispense: 90 tablet; Refill: 0 - valsartan  (DIOVAN ) 80 MG tablet; Take 1 tablet (80 mg total) by mouth daily.  Dispense: 90 tablet; Refill: 0   Patient was given the opportunity to ask questions.  Patient verbalized understanding of the plan and was able to repeat key elements of the plan. Patient was given clear instructions to go to Emergency Department or return to medical center if symptoms don't improve, worsen, or new problems develop.The patient verbalized  understanding.    Requested Prescriptions   Signed Prescriptions Disp Refills   amLODipine  (NORVASC ) 10 MG tablet 90 tablet 0    Sig: Take 1 tablet (10 mg total) by mouth daily.   valsartan  (DIOVAN ) 80 MG tablet 90 tablet 0    Sig: Take 1 tablet (80 mg total) by mouth daily.    Return in about 3 months (around 01/04/2024) for Follow-Up or next available chronic conditions.  Greig JINNY Drones, NP

## 2023-10-04 NOTE — Progress Notes (Signed)
 3 month follow-up

## 2023-10-08 ENCOUNTER — Inpatient Hospital Stay: Admitting: Physician Assistant

## 2023-10-14 ENCOUNTER — Other Ambulatory Visit: Payer: Self-pay | Admitting: Family

## 2023-10-14 ENCOUNTER — Ambulatory Visit
Admission: EM | Admit: 2023-10-14 | Discharge: 2023-10-14 | Disposition: A | Discharge status: Home / Self Care | Attending: Physician Assistant | Admitting: Physician Assistant

## 2023-10-14 ENCOUNTER — Other Ambulatory Visit: Payer: Self-pay

## 2023-10-14 ENCOUNTER — Telehealth: Payer: Self-pay | Admitting: Family

## 2023-10-14 DIAGNOSIS — H9203 Otalgia, bilateral: Secondary | ICD-10-CM

## 2023-10-14 DIAGNOSIS — H66001 Acute suppurative otitis media without spontaneous rupture of ear drum, right ear: Secondary | ICD-10-CM

## 2023-10-14 MED ORDER — ZYRTEC ALLERGY 10 MG PO CAPS: 10.0000 mg | ORAL_CAPSULE | Freq: Every day | ORAL | 0 refills | Status: DC

## 2023-10-14 MED ORDER — FLUTICASONE PROPIONATE 50 MCG/ACT NA SUSP: 1.0000 | Freq: Every day | NASAL | 0 refills | Status: AC

## 2023-10-14 MED ORDER — AMOXICILLIN-POT CLAVULANATE 875-125 MG PO TABS
1.0000 | ORAL_TABLET | Freq: Two times a day (BID) | ORAL | 0 refills | Status: DC
Start: 2023-10-14 — End: 2024-01-03

## 2023-10-14 NOTE — Discharge Instructions (Signed)
 We are treating you for an ear infection.  Start Augmentin  twice daily for 7 days.  Take cetirizine  at night to help with allergy  symptoms and use fluticasone  nasal spray as well.  You can use nasal saline sinus rinses for additional symptom relief.  Follow-up with ENT given your current symptoms.  Call them to schedule an appointment.  You can use Tylenol  and ibuprofen  for pain.  If anything worsens or you have drainage from the ear, increasing pain, fever, nausea, vomiting you should be seen immediately.

## 2023-10-14 NOTE — ED Provider Notes (Signed)
 [name]    CSN: [id number] Arrival date & time: 10/14/23  0804      History   Chief Complaint Chief Complaint  Patient presents with   Otalgia    HPI Gloria Kemp is a 28 y.o. female.   Patient presents today with a 2-day history of bilateral otalgia that is worse on the right.  She reports that pain is rated 6 on a 0-10 pain scale described as pressure with periodic sharp pains, no limiting factors identified.  She denies any recent illness including cough, congestion, fever, nausea, vomiting.  Denies any associated otorrhea.  She has been using a warm compress on the ear as well as ibuprofen  without improvement.  She has no concern for pregnancy as she had a tubal ligation.  She denies history of seasonal allergies and does not take allergy  medication on a regular basis.  She has not had tubes and never seen an ENT but does report recurrent ear infections and so is interested in being seen by specialist.  She does occasionally use Q-tips but denies any recent use of this.  She wears air pods occasionally but not regularly.  Denies any recent swimming or airplane travel.    Past Medical History:  Diagnosis Date   Chronic hypertension with superimposed pre-eclampsia 01/02/2021   Gestational diabetes 10/27/2018   Possible VSD seen on fetal echo.  Current Diabetic Medications:  None  [x]  Aspirin  81 mg daily after 12 weeks (? A2/B GDM)  Required Referrals for A1GDM or A2GDM: [x]  Diabetes Education and Testing Supplies [x]  Nutrition Cousult  For A2/B GDM or higher classes of DM [ ]  Diabetes Education and Testing Supplies [ ]  Nutrition Counsult [ ]  Fetal ECHO after 20 weeks  [ ]  Eye exam for retina evaluation    History of gestational diabetes 06/28/2020   History of prior pregnancy with IUGR newborn 06/28/2020   Hypertension    IUGR (intrauterine growth restriction) affecting care of mother 02/12/2019   02/11/19: 8th%tile    Follow up UA dopplers in 2 weeks  NST in 1  week  Repeat growth in 3-4 weeks.  Initiate BPP at 32 weeks.  Guidelines for Antenatal Testing and Sonography  (with updated ICD-10 codes)  Updated 10-31-18 with Dr. Fredia Fresh  IUGR- [phone]    EFW < 10%, nml Dopplers & AFV, AC<3%, - other comorbidities        EFW < 10% w/ AEDF & low AFV or EFW < 3%   Q 3-4wks   As per MFM  At d   Supervision of normal first pregnancy 09/16/2018    Nursing Staff Provider Office Location  [facility] Dating  LMP Language   English Anatomy US   Normal, but possible VSD on echo, repeat echo normal Flu Vaccine  Declined 12-11-18 Genetic Screen  NIPS:  Insufficient fetal fraction, declined testing  TDaP vaccine   Declined 03/06/19 Hgb A1C or  GTT Early: 5.7 Third trimester Failed 2 hr on 10/24/18 Rhogam   n/a   LAB RESULTS  Feeding Plan  Breastfeed     Patient Active Problem List   Diagnosis Date Noted   Well woman exam with routine gynecological exam 09/05/2023   Menorrhagia with regular cycle 09/05/2023   Proteinuria 07/20/2021   Uncontrolled hypertension 07/20/2021   Obesity (BMI 30-39.9) 07/20/2021   Prediabetes 08/08/2020   Child with VSD (ventricular septal defect) 08/08/2020   Alpha thalassemia silent carrier 10/31/2018    Past Surgical History:  Procedure Laterality Date  ADENOIDECTOMY     EYE SURGERY     TUBAL LIGATION Bilateral 01/03/2021   Procedure: POST PARTUM TUBAL LIGATION;  Surgeon: Barbra Lang PARAS, DO;  Location: [facility];  Service: Gynecology;  Laterality: Bilateral;    OB History     Gravida  2   Para  2   Term  2   Preterm      AB      Living  2      SAB      IAB      Ectopic      Multiple  0   Live Births  2            Home Medications    Prior to Admission medications   Medication Sig Start Date End Date Taking? Authorizing Provider  amLODipine  (NORVASC ) 10 MG tablet Take 1 tablet (10 mg total) by mouth daily. 10/04/23  Yes Lorren, Amy J, NP  amoxicillin -clavulanate (AUGMENTIN ) 875-125 MG tablet Take  1 tablet by mouth every 12 (twelve) hours. 10/14/23  Yes Oda Placke K, PA-C  Cetirizine  HCl (ZYRTEC  ALLERGY ) 10 MG CAPS Take 1 capsule (10 mg total) by mouth at bedtime. 10/14/23  Yes Jodye Scali K, PA-C  fluticasone  (FLONASE ) 50 MCG/ACT nasal spray Place 1 spray into both nostrils daily. 10/14/23  Yes Dhara Schepp K, PA-C  valsartan  (DIOVAN ) 80 MG tablet Take 1 tablet (80 mg total) by mouth daily. 10/04/23 01/02/24 Yes Lorren, Amy J, NP  ferrous sulfate 324 MG TBEC Take 324 mg by mouth daily with breakfast.    [provider]    Family History Family History  Problem Relation Age of Onset   Hypertension Mother    Obesity Mother    Diabetes Father    Hearing loss Father    Hypertension Father    Obesity Father    Stroke Paternal Aunt    Kidney disease Paternal Uncle    Hypertension Maternal Grandmother    Obesity Maternal Grandmother    Cancer Maternal Grandfather    Arthritis Paternal Grandmother    Diabetes Paternal Grandmother    Hypertension Paternal Grandmother    Miscarriages / Stillbirths Paternal Grandmother    Obesity Paternal Grandmother     Social History Social History   Tobacco Use   Smoking status: Never   Smokeless tobacco: Never  Vaping Use   Vaping status: Never Used  Substance Use Topics   Alcohol use: Yes    Comment: Occassionally.   Drug use: Never     Allergies   Patient has no known allergies.   Review of Systems Review of Systems  Constitutional:  Positive for activity change. Negative for appetite change, fatigue and fever.  HENT:  Positive for ear pain. Negative for congestion, ear discharge and sore throat.   Respiratory:  Negative for cough and shortness of breath.   Cardiovascular:  Negative for chest pain.  Gastrointestinal:  Negative for abdominal pain, diarrhea, nausea and vomiting.  Neurological:  Negative for dizziness and headaches.     Physical Exam Triage Vital Signs ED Triage Vitals  Encounter Vitals Group      BP 10/14/23 0812 130/86     Girls Systolic BP Percentile --      Girls Diastolic BP Percentile --      Boys Systolic BP Percentile --      Boys Diastolic BP Percentile --      Pulse Rate 10/14/23 0812 (!) 113     Resp 10/14/23 0812 18  Temp 10/14/23 0812 99.1 F (37.3 C)     Temp Source 10/14/23 0812 Oral     SpO2 10/14/23 0812 98 %     Weight --      Height --      Head Circumference --      Peak Flow --      Pain Score 10/14/23 0811 6     Pain Loc --      Pain Education --      Exclude from Growth Chart --    No data found.  Updated Vital Signs BP 130/86 (BP Location: Left Arm)   Pulse 97   Temp 99.1 F (37.3 C) (Oral)   Resp 18   LMP 10/02/2023 (Exact Date)   SpO2 97%   Visual Acuity Right Eye Distance:   Left Eye Distance:   Bilateral Distance:    Right Eye Near:   Left Eye Near:    Bilateral Near:     Physical Exam Vitals reviewed.  Constitutional:      General: She is awake. She is not in acute distress.    Appearance: Normal appearance. She is well-developed. She is not ill-appearing.     Comments: Very pleasant female appears stated age in no acute distress sitting comfortably in exam room  HENT:     Head: Normocephalic and atraumatic.     Right Ear: Ear canal and external ear normal. Tympanic membrane is erythematous and bulging.     Left Ear: Ear canal and external ear normal. Tympanic membrane is injected and retracted. Tympanic membrane is not erythematous or bulging.     Nose:     Right Sinus: No maxillary sinus tenderness or frontal sinus tenderness.     Left Sinus: No maxillary sinus tenderness or frontal sinus tenderness.     Mouth/Throat:     Pharynx: Uvula midline. No oropharyngeal exudate or posterior oropharyngeal erythema.  Cardiovascular:     Rate and Rhythm: Normal rate and regular rhythm.     Heart sounds: Normal heart sounds, S1 normal and S2 normal. No murmur heard. Pulmonary:     Effort: Pulmonary effort is normal.     Breath  sounds: Normal breath sounds. No wheezing, rhonchi or rales.     Comments: Clear to auscultation bilaterally Psychiatric:        Behavior: Behavior is cooperative.      UC Treatments / Results  Labs (all labs ordered are listed, but only abnormal results are displayed) Labs Reviewed - No data to display  EKG   Radiology No results found.  Procedures Procedures (including critical care time)  Medications Ordered in UC Medications - No data to display  Initial Impression / Assessment and Plan / UC Course  I have reviewed the triage vital signs and the nursing notes.  Pertinent labs & imaging results that were available during my care of the patient were reviewed by me and considered in my medical decision making (see chart for details).     Patient is well-appearing, afebrile, nontoxic.  She was initially mildly tachycardic but this normalized after sitting quietly for several minutes.  Otitis media was identified on physical exam.  She was started on Augmentin  twice daily for 7 days.  No indication for dose adjustment based on metabolic panel from 10/01/2023 with a creatinine of 0.73 and calculated creatinine clearance of 177.28 mL/min.  We discussed that she also has eustachian tube dysfunction contributing to her discomfort and so recommended she begin cetirizine  daily as well  as fluticasone  nasal spray.  She can use nasal saline sinus rinses for additional symptom relief.  Given her recurrent symptoms I did recommend she follow-up with ENT and was given the contact information for a local provider with instruction to call to schedule appointment.  We discussed that if anything worsens or changes and she has otorrhea, increasing pain, fever, nausea, vomiting she should be seen immediately.  Strict return precautions given.  Excuse note provided.  Final Clinical Impressions(s) / UC Diagnoses   Final diagnoses:  Non-recurrent acute suppurative otitis media of right ear without  spontaneous rupture of tympanic membrane  Acute otalgia, bilateral     Discharge Instructions      We are treating you for an ear infection.  Start Augmentin  twice daily for 7 days.  Take cetirizine  at night to help with allergy  symptoms and use fluticasone  nasal spray as well.  You can use nasal saline sinus rinses for additional symptom relief.  Follow-up with ENT given your current symptoms.  Call them to schedule an appointment.  You can use Tylenol  and ibuprofen  for pain.  If anything worsens or you have drainage from the ear, increasing pain, fever, nausea, vomiting you should be seen immediately.     ED Prescriptions     Medication Sig Dispense Auth. Provider   amoxicillin -clavulanate (AUGMENTIN ) 875-125 MG tablet Take 1 tablet by mouth every 12 (twelve) hours. 14 tablet Meliana Canner K, PA-C   Cetirizine  HCl (ZYRTEC  ALLERGY ) 10 MG CAPS Take 1 capsule (10 mg total) by mouth at bedtime. 30 capsule Macarena Langseth K, PA-C   fluticasone  (FLONASE ) 50 MCG/ACT nasal spray Place 1 spray into both nostrils daily. 16 g Danasha Melman K, PA-C      PDMP not reviewed this encounter.   Sherrell Rocky POUR, PA-C 10/14/23 [phone]

## 2023-10-14 NOTE — ED Triage Notes (Signed)
 Right ear pain x 1 day. Concerned for infection

## 2023-10-14 NOTE — Telephone Encounter (Signed)
 Complete

## 2023-10-14 NOTE — Telephone Encounter (Signed)
 Patient was seen at Urgent care 07/21 , they recommended she gets referral to ENT. Will she need to come in for appointment or can this be sent please advise.

## 2023-10-16 NOTE — Telephone Encounter (Signed)
 I called patient to make her aware her referral was put in no one answered so I left a voicemail to return my call.

## 2023-10-17 ENCOUNTER — Ambulatory Visit: Admitting: Obstetrics and Gynecology

## 2023-10-17 NOTE — Telephone Encounter (Signed)
 I spoke with patient and made them aware of their recommendations per PCP.  Patient verbalized understanding

## 2023-10-18 ENCOUNTER — Other Ambulatory Visit (HOSPITAL_COMMUNITY)
Admission: RE | Admit: 2023-10-18 | Discharge: 2023-10-18 | Disposition: A | Discharge status: Home / Self Care | Source: Ambulatory Visit | Attending: Obstetrics and Gynecology | Admitting: Obstetrics and Gynecology

## 2023-10-18 ENCOUNTER — Encounter: Payer: Self-pay | Admitting: Obstetrics and Gynecology

## 2023-10-18 ENCOUNTER — Ambulatory Visit (INDEPENDENT_AMBULATORY_CARE_PROVIDER_SITE_OTHER): Admitting: Obstetrics and Gynecology

## 2023-10-18 VITALS — BP 122/64 | HR 108 | Temp 98.6°F | Wt 212.0 lb

## 2023-10-18 DIAGNOSIS — R87810 Cervical high risk human papillomavirus (HPV) DNA test positive: Secondary | ICD-10-CM

## 2023-10-18 DIAGNOSIS — Z01812 Encounter for preprocedural laboratory examination: Secondary | ICD-10-CM | POA: Diagnosis not present

## 2023-10-18 DIAGNOSIS — R87610 Atypical squamous cells of undetermined significance on cytologic smear of cervix (ASC-US): Secondary | ICD-10-CM | POA: Insufficient documentation

## 2023-10-18 DIAGNOSIS — N898 Other specified noninflammatory disorders of vagina: Secondary | ICD-10-CM

## 2023-10-18 LAB — PREGNANCY, URINE: Preg Test, Ur: NEGATIVE

## 2023-10-18 LAB — WET PREP FOR TRICH, YEAST, CLUE

## 2023-10-18 MED ORDER — CLOTRIMAZOLE-BETAMETHASONE 1-0.05 % EX CREA: 1.0000 | TOPICAL_CREAM | Freq: Two times a day (BID) | CUTANEOUS | 0 refills | Status: AC

## 2023-10-18 MED ORDER — FLUCONAZOLE 150 MG PO TABS: 150.0000 mg | ORAL_TABLET | Freq: Once | ORAL | 0 refills | Status: AC

## 2023-10-18 NOTE — Patient Instructions (Signed)
 It is common to have vaginal bleeding and cramping for up to 72 hours after your biopsy. Please call our office with heavy vaginal bleeding, severe abdominal pain or fever. Avoid intercourse, tampon use, douching and baths for 7 days to decrease the risk of infection.

## 2023-10-18 NOTE — Progress Notes (Signed)
 28 y.o. [id number] female s/p BTL with menorrhagia (aygestin ), HTN (2 antihypertensives), preDM, IDA here for colposcopy. Single, long time boyfriend. Works at [employer].  Patient's last menstrual period was 10/02/2023 (exact date).   She reports she is being treated for ear infection with abx.  Vaginal odor and thick discharge started over past week, desires to be tested for yeast or BV. Has ENT referral. Gardasil complete 2016- records 09/12/23 TVUS scheduled for Sept 2025 for menorrhagia  Last PAP:    Component Value Date/Time   DIAGPAP (A) 09/05/2023 1441    - Atypical squamous cells of undetermined significance (ASC-US )   DIAGPAP  10/07/2018 0000    NEGATIVE FOR INTRAEPITHELIAL LESIONS OR MALIGNANCY.   HPVHIGH Positive (A) 09/05/2023 1441   ADEQPAP  09/05/2023 1441    Satisfactory for evaluation; transformation zone component PRESENT.   ADEQPAP  10/07/2018 0000    Satisfactory for evaluation  endocervical/transformation zone component PRESENT.   GYN HISTORY: Preeclampsia   OB History  Gravida Para Term Preterm AB Living  2 2 2   2   SAB IAB Ectopic Multiple Live Births     0 2    # Outcome Date GA Lbr Len/2nd Weight Sex Type Anes PTL Lv  2 Term 01/02/21 [redacted]w[redacted]d 08:32 / 00:05 5 lb 15.9 oz (2.72 kg) F Vag-Spont None  LIV  1 Term 04/05/19 [redacted]w[redacted]d 08:03 / 00:02 4 lb 14.1 oz (2.214 kg) M Vag-Spont None  LIV    Past Medical History:  Diagnosis Date   Chronic hypertension with superimposed pre-eclampsia 01/02/2021   Gestational diabetes 10/27/2018   Possible VSD seen on fetal echo.  Current Diabetic Medications:  None  [x]  Aspirin  81 mg daily after 12 weeks (? A2/B GDM)  Required Referrals for A1GDM or A2GDM: [x]  Diabetes Education and Testing Supplies [x]  Nutrition Cousult  For A2/B GDM or higher classes of DM [ ]  Diabetes Education and Testing Supplies [ ]  Nutrition Counsult [ ]  Fetal ECHO after 20 weeks  [ ]  Eye exam for retina evaluation    History of gestational diabetes  06/28/2020   History of prior pregnancy with IUGR newborn 06/28/2020   Hypertension    IUGR (intrauterine growth restriction) affecting care of mother 02/12/2019   02/11/19: 8th%tile    Follow up UA dopplers in 2 weeks  NST in 1 week  Repeat growth in 3-4 weeks.  Initiate BPP at 32 weeks.  Guidelines for Antenatal Testing and Sonography  (with updated ICD-10 codes)  Updated 2018-10-28 with Dr. Fredia Fresh  IUGR- [phone]    EFW < 10%, nml Dopplers & AFV, AC<3%, - other comorbidities        EFW < 10% w/ AEDF & low AFV or EFW < 3%   Q 3-4wks   As per MFM  At d   Supervision of normal first pregnancy 09/16/2018    Nursing Staff Provider Office Location  [facility] Dating  LMP Language   English Anatomy US   Normal, but possible VSD on echo, repeat echo normal Flu Vaccine  Declined 12-11-18 Genetic Screen  NIPS:  Insufficient fetal fraction, declined testing  TDaP vaccine   Declined 03/06/19 Hgb A1C or  GTT Early: 5.7 Third trimester Failed 2 hr on 10/24/18 Rhogam   n/a   LAB RESULTS  Feeding Plan  Breastfeed     Past Surgical History:  Procedure Laterality Date   ADENOIDECTOMY     EYE SURGERY     TUBAL LIGATION Bilateral 01/03/2021   Procedure:  POST PARTUM TUBAL LIGATION;  Surgeon: Barbra Lang PARAS, DO;  Location: [facility];  Service: Gynecology;  Laterality: Bilateral;    Current Outpatient Medications on File Prior to Visit  Medication Sig Dispense Refill   amLODipine  (NORVASC ) 10 MG tablet Take 1 tablet (10 mg total) by mouth daily. 90 tablet 0   amoxicillin -clavulanate (AUGMENTIN ) 875-125 MG tablet Take 1 tablet by mouth every 12 (twelve) hours. 14 tablet 0   Cetirizine  HCl (ZYRTEC  ALLERGY ) 10 MG CAPS Take 1 capsule (10 mg total) by mouth at bedtime. 30 capsule 0   ferrous sulfate 324 MG TBEC Take 324 mg by mouth daily with breakfast.     fluticasone  (FLONASE ) 50 MCG/ACT nasal spray Place 1 spray into both nostrils daily. 16 g 0   valsartan  (DIOVAN ) 80 MG tablet Take 1 tablet (80 mg total) by  mouth daily. 90 tablet 0   No current facility-administered medications on file prior to visit.    No Known Allergies    PE Today's Vitals   10/18/23 1002  BP: 122/64  Pulse: (!) 108  Temp: 98.6 F (37 C)  TempSrc: Oral  SpO2: 98%  Weight: 212 lb (96.2 kg)   Body mass index is 38.78 kg/m.  Physical Exam Vitals reviewed. Exam conducted with a chaperone present.  Constitutional:      General: She is not in acute distress.    Appearance: Normal appearance.  HENT:     Head: Normocephalic and atraumatic.     Nose: Nose normal.  Eyes:     Extraocular Movements: Extraocular movements intact.     Conjunctiva/sclera: Conjunctivae normal.  Pulmonary:     Effort: Pulmonary effort is normal.  Genitourinary:    General: Normal vulva.     Exam position: Lithotomy position.     Vagina: Normal. No vaginal discharge.     Cervix: Normal. No cervical motion tenderness, discharge or lesion.     Uterus: Normal. Not enlarged and not tender.      Adnexa: Right adnexa normal and left adnexa normal.  Musculoskeletal:        General: Normal range of motion.     Cervical back: Normal range of motion.  Neurological:     General: No focal deficit present.     Mental Status: She is alert.  Psychiatric:        Mood and Affect: Mood normal.        Behavior: Behavior normal.      Colposcopy Procedure Consented for procedure.  Time out performed. Speculum placed in vagina.  Acetic acid 3% was applied to cervix.  Satisfactory colposcopy. TZ was completely seen. Hurricaine anesthetic spray was applied. Biopsies taken Yes.   Location(s) - 12:00, 3:00  Findings: +AW change ECC was performed.  Specimens to pathology separately.  Monsel's applied to biopsy areas.   Good hemostasis.  Minimal EBL. No complications.  Tolerated well.     Assessment and Plan:        ASCUS with positive high risk HPV cervical -     Colposcopy -     Surgical pathology -     Surgical  pathology  Pre-procedure lab exam -     Pregnancy, urine  Vaginal discharge -     WET PREP FOR TRICH, YEAST, CLUE  Other orders -     Fluconazole ; Take 1 tablet (150 mg total) by mouth once for 1 dose.  Dispense: 1 tablet; Refill: 0 -     Clotrimazole-Betamethasone; Apply 1 Application topically  2 (two) times daily for 7 days. To vulva and perineum  Dispense: 30 g; Refill: 0    Abnormal PAP results reviewed. ASCCP guidelines reviewed. Consents signed. Satisfactory colposcopy, ECC and biopsy performed. Aftercare instructions provided.  Will call with results. I also recommend completion of Gardasil vaccine up to age 45yo, avoidance of smoking, and use of condoms with new sexual partners to prevent progression of disease.   Vera LULLA Pa, MD

## 2023-10-21 LAB — SURGICAL PATHOLOGY

## 2023-10-22 ENCOUNTER — Ambulatory Visit (HOSPITAL_BASED_OUTPATIENT_CLINIC_OR_DEPARTMENT_OTHER): Payer: Self-pay | Admitting: Obstetrics and Gynecology

## 2023-12-12 ENCOUNTER — Other Ambulatory Visit

## 2023-12-12 ENCOUNTER — Other Ambulatory Visit: Admitting: Obstetrics and Gynecology

## 2023-12-14 ENCOUNTER — Encounter (HOSPITAL_COMMUNITY): Payer: Self-pay

## 2023-12-14 ENCOUNTER — Other Ambulatory Visit: Payer: Self-pay

## 2023-12-14 ENCOUNTER — Emergency Department (HOSPITAL_COMMUNITY)
Admission: EM | Admit: 2023-12-14 | Discharge: 2023-12-14 | Disposition: A | Discharge status: Home / Self Care | Attending: Emergency Medicine | Admitting: Emergency Medicine

## 2023-12-14 ENCOUNTER — Emergency Department (HOSPITAL_COMMUNITY)

## 2023-12-14 DIAGNOSIS — M25511 Pain in right shoulder: Secondary | ICD-10-CM | POA: Diagnosis present

## 2023-12-14 DIAGNOSIS — R000 Tachycardia, unspecified: Secondary | ICD-10-CM

## 2023-12-14 DIAGNOSIS — I10 Essential (primary) hypertension: Secondary | ICD-10-CM

## 2023-12-14 LAB — CBC WITH DIFFERENTIAL/PLATELET
Abs Immature Granulocytes: 0.04 K/uL (ref 0.00–0.07)
Basophils Absolute: 0 K/uL (ref 0.0–0.1)
Basophils Relative: 0 %
Eosinophils Absolute: 0.2 K/uL (ref 0.0–0.5)
Eosinophils Relative: 2 %
HCT: 40.3 % (ref 36.0–46.0)
Hemoglobin: 12.9 g/dL (ref 12.0–15.0)
Immature Granulocytes: 0 %
Lymphocytes Relative: 21 %
Lymphs Abs: 2.4 K/uL (ref 0.7–4.0)
MCH: 25.6 pg — ABNORMAL LOW (ref 26.0–34.0)
MCHC: 32 g/dL (ref 30.0–36.0)
MCV: 80 fL (ref 80.0–100.0)
Monocytes Absolute: 0.7 K/uL (ref 0.1–1.0)
Monocytes Relative: 6 %
Neutro Abs: 8.3 K/uL — ABNORMAL HIGH (ref 1.7–7.7)
Neutrophils Relative %: 71 %
Platelets: 348 K/uL (ref 150–400)
RBC: 5.04 MIL/uL (ref 3.87–5.11)
RDW: 13.2 % (ref 11.5–15.5)
WBC: 11.7 K/uL — ABNORMAL HIGH (ref 4.0–10.5)
nRBC: 0 % (ref 0.0–0.2)

## 2023-12-14 LAB — D-DIMER, QUANTITATIVE: D-Dimer, Quant: <0.27 ug{FEU}/mL (ref 0.00–0.50)

## 2023-12-14 LAB — BASIC METABOLIC PANEL WITH GFR
Anion gap: 12 (ref 5–15)
BUN: 10 mg/dL (ref 6–20)
CO2: 22 mmol/L (ref 22–32)
Calcium: 9.7 mg/dL (ref 8.9–10.3)
Chloride: 104 mmol/L (ref 98–111)
Creatinine, Ser: 0.85 mg/dL (ref 0.44–1.00)
GFR, Estimated: >60 mL/min (ref >60)
Glucose, Bld: 114 mg/dL — ABNORMAL HIGH (ref 70–99)
Potassium: 4 mmol/L (ref 3.5–5.1)
Sodium: 138 mmol/L (ref 135–145)

## 2023-12-14 LAB — HCG, SERUM, QUALITATIVE: Preg, Serum: NEGATIVE

## 2023-12-14 LAB — TSH: TSH: 2.4 u[IU]/mL (ref 0.350–4.500)

## 2023-12-14 MED ORDER — ONDANSETRON HCL 4 MG/2ML IJ SOLN
4.0000 mg | Freq: Once | INTRAMUSCULAR | Status: AC
  Administered 2023-12-14: 4 mg via INTRAVENOUS
  Filled 2023-12-14: qty 2

## 2023-12-14 MED ORDER — SODIUM CHLORIDE 0.9 % IV BOLUS
1000.0000 mL | Freq: Once | INTRAVENOUS | Status: AC
  Administered 2023-12-14: 1000 mL via INTRAVENOUS

## 2023-12-14 MED ORDER — MORPHINE SULFATE (PF) 4 MG/ML IV SOLN
4.0000 mg | Freq: Once | INTRAVENOUS | Status: AC
  Administered 2023-12-14: 4 mg via INTRAVENOUS
  Filled 2023-12-14: qty 1

## 2023-12-14 MED ORDER — KETOROLAC TROMETHAMINE 30 MG/ML IJ SOLN
15.0000 mg | Freq: Once | INTRAMUSCULAR | Status: AC
  Administered 2023-12-14: 15 mg via INTRAVENOUS
  Filled 2023-12-14: qty 1

## 2023-12-14 NOTE — ED Notes (Signed)
 Patient ambulated to the bathroom independently.

## 2023-12-14 NOTE — ED Triage Notes (Signed)
 Pt came in via POV Rt sided shoulder pain that started approx. 5 days ago, denies any recent falls/injuries. Does endorse that she works at the [employer] heavy [employer] though. A/OX4, rats her pain 8/10 during triage.

## 2023-12-14 NOTE — ED Triage Notes (Signed)
 Pt to ER with c/o right shoulder pain starting several days ago.  No known injury.

## 2023-12-14 NOTE — ED Provider Notes (Signed)
 [place] [facility] Provider Note   CSN: [id number] Arrival date & time: 12/14/23  1629     Patient presents with: Shoulder Pain   Gloria Kemp is a 28 y.o. female who presents emergency department with chief complaint of right shoulder pain.  Patient states that it started several days ago.  She had no known injuries but has progressive worsening of pain in the right shoulder both at rest but much worse with any movement.  She does a lot of heavy lifting at work she denies any previous episodes of pain.  She denies abdominal pain nausea or vomiting.  No known injuries.  She is left-hand dominant.    Shoulder Pain      Prior to Admission medications   Medication Sig Start Date End Date Taking? Authorizing Provider  amLODipine  (NORVASC ) 10 MG tablet Take 1 tablet (10 mg total) by mouth daily. 10/04/23   Lorren Greig PARAS, NP  amoxicillin -clavulanate (AUGMENTIN ) 875-125 MG tablet Take 1 tablet by mouth every 12 (twelve) hours. 10/14/23   Raspet, Erin K, PA-C  Cetirizine  HCl (ZYRTEC  ALLERGY ) 10 MG CAPS Take 1 capsule (10 mg total) by mouth at bedtime. 10/14/23   Raspet, Erin K, PA-C  ferrous sulfate 324 MG TBEC Take 324 mg by mouth daily with breakfast.    [provider]  fluticasone  (FLONASE ) 50 MCG/ACT nasal spray Place 1 spray into both nostrils daily. 10/14/23   Raspet, Erin K, PA-C  valsartan  (DIOVAN ) 80 MG tablet Take 1 tablet (80 mg total) by mouth daily. 10/04/23 01/02/24  Lorren Greig PARAS, NP    Allergies: Patient has no known allergies.    Review of Systems  Updated Vital Signs BP (!) 157/93 (BP Location: Left Arm)   Pulse (!) 138   Temp 98.2 F (36.8 C) (Oral)   Resp 19   Ht 5' 1 (1.549 m)   Wt 95.3 kg   SpO2 100%   BMI 39.68 kg/m   Physical Exam Vitals and nursing note reviewed.  Constitutional:      General: She is not in acute distress.    Appearance: She is well-developed. She is not diaphoretic.  HENT:     Head:  Normocephalic and atraumatic.     Right Ear: External ear normal.     Left Ear: External ear normal.     Nose: Nose normal.     Mouth/Throat:     Mouth: Mucous membranes are moist.  Eyes:     General: No scleral icterus.    Conjunctiva/sclera: Conjunctivae normal.  Cardiovascular:     Rate and Rhythm: Normal rate and regular rhythm.     Heart sounds: Normal heart sounds. No murmur heard.    No friction rub. No gallop.  Pulmonary:     Effort: Pulmonary effort is normal. No respiratory distress.     Breath sounds: Normal breath sounds.  Abdominal:     General: Bowel sounds are normal. There is no distension.     Palpations: Abdomen is soft. There is no mass.     Tenderness: There is no abdominal tenderness. There is no guarding.  Musculoskeletal:     Cervical back: Normal range of motion.     Comments: Pain with abduction of the shoulder at about 90 degrees.  Strength is limited due to pain.  Normal ipsilateral right elbow and wrist examination, normal grip strength bilaterally, radial pulse 2+  Skin:    General: Skin is warm and dry.  Neurological:  Mental Status: She is alert and oriented to person, place, and time.  Psychiatric:        Behavior: Behavior normal.     (all labs ordered are listed, but only abnormal results are displayed) Labs Reviewed - No data to display  EKG: None  Radiology: DG Shoulder Right Result Date: 12/14/2023 CLINICAL DATA:  Right shoulder pain.  No known injury. EXAM: RIGHT SHOULDER - 2+ VIEW COMPARISON:  None Available. FINDINGS: There is no evidence of acute fracture or dislocation. There is no evidence of arthropathy or other focal bone abnormality. Soft tissues are unremarkable. IMPRESSION: Negative. Electronically Signed   By: Leita Birmingham M.D.   On: 12/14/2023 17:21     Procedures   Medications Ordered in the ED - No data to display  Clinical Course as of 12/19/23 1144  Sat Dec 14, 2023  1859 Patient reevaluated.  She states that  she still in significant pain although the Toradol  helped and she thinks is why her heart rate still high.  Will try a little bit of increased pain control. [AH]    Clinical Course User Index [AH] Samay Delcarlo, PA-C                                 Medical Decision Making Amount and/or Complexity of Data Reviewed Labs: ordered. Radiology: ordered. ECG/medicine tests: ordered.  Risk Prescription drug management.   28 year old female who presents emergency department chief complaint of right shoulder pain.  Suspect rotator cuff injury or other intrinsic joint pathology.  Current plan is to discharge with sling for comfort range of motion exercise anti-inflammatories ice rest work precautions and outpatient follow-up with orthopedics.  Initial heart rate listed at 138.   Patient is persistently tachycardic.  She is not having chest pain or shortness of breath.  She has pain with movement of her right shoulder which is all in line with joint problem.  I have added on some labs, fluid will treat her pain as she is very uncomfortable.  Will reassess after interventions.    Final diagnoses:  None    ED Discharge Orders     None          Arloa Chroman, PA-C 12/19/23 1144    Doretha Folks, MD 12/27/23 [phone]

## 2023-12-14 NOTE — Discharge Instructions (Signed)
 Read and follow the instructions in the shoulder pain handout.  Perform range of motion exercises.  Follow closely with orthopedics and return for any new or worsening symptoms.  Also follow closely with your primary care doctor for your tachycardia (fast heart rate  Contact a health care provider if: Your pain is not relieved with medicines. New pain develops in your arm, hand, or fingers. You loosen your sling and your arm, hand, or fingers remain tingly, numb, swollen, or painful. Get help right away if: Your arm, hand, or fingers turn white or blue.

## 2023-12-14 NOTE — ED Notes (Signed)
 Assumed care of patient. Patient is resting in bed. Rated pain prior to giving the morphine . Patient states she is not driving.

## 2024-01-03 ENCOUNTER — Ambulatory Visit
Admission: EM | Admit: 2024-01-03 | Discharge: 2024-01-03 | Disposition: A | Discharge status: Home / Self Care | Attending: Family Medicine | Admitting: Family Medicine

## 2024-01-03 DIAGNOSIS — K529 Noninfective gastroenteritis and colitis, unspecified: Secondary | ICD-10-CM

## 2024-01-03 MED ORDER — ONDANSETRON 4 MG PO TBDP
4.0000 mg | ORAL_TABLET | Freq: Once | ORAL | Status: AC
  Administered 2024-01-03: 4 mg via ORAL

## 2024-01-03 MED ORDER — ONDANSETRON 4 MG PO TBDP: 4.0000 mg | ORAL_TABLET | Freq: Three times a day (TID) | ORAL | 0 refills | Status: DC | PRN

## 2024-01-03 NOTE — ED Provider Notes (Signed)
 [name]    CSN: [id number] Arrival date & time: 01/03/24  0801      History   Chief Complaint Chief Complaint  Patient presents with   Emesis   Stomach Discomfort   Otalgia    HPI Gloria Kemp is a 28 y.o. female.    Emesis Otalgia Associated symptoms: vomiting   Here for n/v/d.  Last night she started having some nausea and did throw up once.  She has not thrown up since last evening.  She was also having some cramping in that would resolve when she would go have a bowel movement.  She has had several loose stool since last night, but no blood in any output.  Uncertain if any fever but she has had some chills.  She has not had any cramping or abdominal pain this morning.  She also notes some congestion that she had for a couple of weeks but that is improving.  She also had had some intermittent bilateral ear pain since October 6.  The last time she had any ear pain was 2 days ago.  She did have an otitis media in July of this year.  She was going to follow-up with her primary care but the appointment was canceled and she has not gotten to reschedule.  NKDA  Last menstrual cycle September 17.  Past Medical History:  Diagnosis Date   Chronic hypertension with superimposed pre-eclampsia 01/02/2021   Gestational diabetes 10/27/2018   Possible VSD seen on fetal echo.  Current Diabetic Medications:  None  [x]  Aspirin  81 mg daily after 12 weeks (? A2/B GDM)  Required Referrals for A1GDM or A2GDM: [x]  Diabetes Education and Testing Supplies [x]  Nutrition Cousult  For A2/B GDM or higher classes of DM [ ]  Diabetes Education and Testing Supplies [ ]  Nutrition Counsult [ ]  Fetal ECHO after 20 weeks  [ ]  Eye exam for retina evaluation    History of gestational diabetes 06/28/2020   History of prior pregnancy with IUGR newborn 06/28/2020   Hypertension    IUGR (intrauterine growth restriction) affecting care of mother 02/12/2019   02/11/19: 8th%tile    Follow up  UA dopplers in 2 weeks  NST in 1 week  Repeat growth in 3-4 weeks.  Initiate BPP at 32 weeks.  Guidelines for Antenatal Testing and Sonography  (with updated ICD-10 codes)  Updated 10-30-18 with Dr. Fredia Fresh  IUGR- [phone]    EFW < 10%, nml Dopplers & AFV, AC<3%, - other comorbidities        EFW < 10% w/ AEDF & low AFV or EFW < 3%   Q 3-4wks   As per MFM  At d   Supervision of normal first pregnancy 09/16/2018    Nursing Staff Provider Office Location  [facility] Dating  LMP Language   English Anatomy US   Normal, but possible VSD on echo, repeat echo normal Flu Vaccine  Declined 12-11-18 Genetic Screen  NIPS:  Insufficient fetal fraction, declined testing  TDaP vaccine   Declined 03/06/19 Hgb A1C or  GTT Early: 5.7 Third trimester Failed 2 hr on 10/24/18 Rhogam   n/a   LAB RESULTS  Feeding Plan  Breastfeed     Patient Active Problem List   Diagnosis Date Noted   ASCUS with positive high risk HPV cervical 10/18/2023   Well woman exam with routine gynecological exam 09/05/2023   Menorrhagia with regular cycle 09/05/2023   Proteinuria 07/20/2021   Uncontrolled hypertension 07/20/2021   Obesity (BMI 30-39.9)  07/20/2021   Prediabetes 08/08/2020   Child with VSD (ventricular septal defect) 08/08/2020   Abnormal fetal echocardiogram affecting antepartum care of mother 12/11/2018   Alpha thalassemia silent carrier 10/16/2018    Past Surgical History:  Procedure Laterality Date   ADENOIDECTOMY     EYE SURGERY     TUBAL LIGATION Bilateral 01/03/2021   Procedure: POST PARTUM TUBAL LIGATION;  Surgeon: Barbra Lang PARAS, DO;  Location: [facility] LD ORS;  Service: Gynecology;  Laterality: Bilateral;    OB History     Gravida  2   Para  2   Term  2   Preterm      AB      Living  2      SAB      IAB      Ectopic      Multiple  0   Live Births  2            Home Medications    Prior to Admission medications   Medication Sig Start Date End Date Taking? Authorizing Provider   amLODipine  (NORVASC ) 10 MG tablet Take 1 tablet (10 mg total) by mouth daily. 10/04/23  Yes Lorren, Amy J, NP  ondansetron  (ZOFRAN -ODT) 4 MG disintegrating tablet Take 1 tablet (4 mg total) by mouth every 8 (eight) hours as needed for nausea or vomiting. 01/03/24  Yes Vonna Sharlet POUR, MD  valsartan  (DIOVAN ) 80 MG tablet Take 1 tablet (80 mg total) by mouth daily. 10/04/23 01/03/24 Yes Lorren, Amy J, NP  Cetirizine  HCl (ZYRTEC  ALLERGY ) 10 MG CAPS Take 1 capsule (10 mg total) by mouth at bedtime. 10/14/23   Raspet, Erin K, PA-C  ferrous sulfate 324 MG TBEC Take 324 mg by mouth daily with breakfast.    [provider]  fluticasone  (FLONASE ) 50 MCG/ACT nasal spray Place 1 spray into both nostrils daily. 10/14/23   Raspet, Rocky POUR, PA-C    Family History Family History  Problem Relation Age of Onset   Hypertension Mother    Obesity Mother    Diabetes Father    Hearing loss Father    Hypertension Father    Obesity Father    Stroke Paternal Aunt    Kidney disease Paternal Uncle    Hypertension Maternal Grandmother    Obesity Maternal Grandmother    Cancer Maternal Grandfather    Arthritis Paternal Grandmother    Diabetes Paternal Grandmother    Hypertension Paternal Grandmother    Miscarriages / Stillbirths Paternal Grandmother    Obesity Paternal Grandmother     Social History Social History   Tobacco Use   Smoking status: Never   Smokeless tobacco: Never  Vaping Use   Vaping status: Never Used  Substance Use Topics   Alcohol use: Yes    Comment: Occassionally.   Drug use: Never     Allergies   Patient has no known allergies.   Review of Systems Review of Systems  HENT:  Positive for ear pain.   Gastrointestinal:  Positive for vomiting.     Physical Exam Triage Vital Signs ED Triage Vitals  Encounter Vitals Group     BP 01/03/24 0812 137/83     Girls Systolic BP Percentile --      Girls Diastolic BP Percentile --      Boys Systolic BP Percentile --       Boys Diastolic BP Percentile --      Pulse Rate 01/03/24 0812 (!) 101     Resp 01/03/24 9187  18     Temp 01/03/24 0812 98.2 F (36.8 C)     Temp Source 01/03/24 0812 Oral     SpO2 01/03/24 0812 99 %     Weight 01/03/24 0810 210 lb 1.6 oz (95.3 kg)     Height 01/03/24 0810 5' 1 (1.549 m)     Head Circumference --      Peak Flow --      Pain Score 01/03/24 0806 4     Pain Loc --      Pain Education --      Exclude from Growth Chart --    No data found.  Updated Vital Signs BP 137/83 (BP Location: Left Arm)   Pulse (!) 101   Temp 98.2 F (36.8 C) (Oral)   Resp 18   Ht 5' 1 (1.549 m)   Wt 95.3 kg   LMP 12/11/2023 (Exact Date)   SpO2 99%   BMI 39.70 kg/m   Visual Acuity Right Eye Distance:   Left Eye Distance:   Bilateral Distance:    Right Eye Near:   Left Eye Near:    Bilateral Near:     Physical Exam Vitals reviewed.  Constitutional:      General: She is not in acute distress.    Appearance: She is not ill-appearing, toxic-appearing or diaphoretic.  HENT:     Right Ear: Tympanic membrane and ear canal normal.     Left Ear: Tympanic membrane and ear canal normal.     Nose: Nose normal.     Mouth/Throat:     Mouth: Mucous membranes are moist.     Pharynx: No oropharyngeal exudate or posterior oropharyngeal erythema.  Eyes:     Extraocular Movements: Extraocular movements intact.     Conjunctiva/sclera: Conjunctivae normal.     Pupils: Pupils are equal, round, and reactive to light.  Cardiovascular:     Rate and Rhythm: Normal rate and regular rhythm.     Heart sounds: No murmur heard. Pulmonary:     Effort: No respiratory distress.     Breath sounds: No stridor. No wheezing, rhonchi or rales.  Abdominal:     Palpations: Abdomen is soft.     Tenderness: There is no abdominal tenderness.  Musculoskeletal:     Cervical back: Neck supple.  Lymphadenopathy:     Cervical: No cervical adenopathy.  Skin:    Capillary Refill: Capillary refill takes  less than 2 seconds.     Coloration: Skin is not jaundiced or pale.  Neurological:     General: No focal deficit present.     Mental Status: She is alert and oriented to person, place, and time.  Psychiatric:        Behavior: Behavior normal.      UC Treatments / Results  Labs (all labs ordered are listed, but only abnormal results are displayed) Labs Reviewed - No data to display  EKG   Radiology No results found.  Procedures Procedures (including critical care time)  Medications Ordered in UC Medications  ondansetron  (ZOFRAN -ODT) disintegrating tablet 4 mg (4 mg Oral Given 01/03/24 0815)    Initial Impression / Assessment and Plan / UC Course  I have reviewed the triage vital signs and the nursing notes.  Pertinent labs & imaging results that were available during my care of the patient were reviewed by me and considered in my medical decision making (see chart for details).     Zofran  is sent in for the nausea so that she  can orally rehydrate.  Work note is provided She will make a follow-up appointment with her primary care about the ear problem. Final Clinical Impressions(s) / UC Diagnoses   Final diagnoses:  Gastroenteritis     Discharge Instructions      Ondansetron  dissolved in the mouth every 8 hours as needed for nausea or vomiting. Clear liquids(water, gatorade/pedialyte, ginger ale/sprite, chicken broth/soup) and bland things(crackers/toast, rice, potato, bananas) to eat. Avoid acidic foods like lemon/lime/orange/tomato, and avoid greasy/spicy foods.  Follow-up with your primary care     ED Prescriptions     Medication Sig Dispense Auth. Provider   ondansetron  (ZOFRAN -ODT) 4 MG disintegrating tablet Take 1 tablet (4 mg total) by mouth every 8 (eight) hours as needed for nausea or vomiting. 10 tablet Vonna Amrie Gurganus K, MD      PDMP not reviewed this encounter.   Vonna Sharlet POUR, MD 01/03/24 [phone]

## 2024-01-03 NOTE — ED Triage Notes (Signed)
 Patient reports the onset of stomach discomfort beginning last night, accompanied by an urgent need to use the bathroom. She had one episode of diarrhea at that time and has continued to have loose stools since, including one episode prior to presenting today. She also reports persistent nausea throughout. Additionally, the patient mentions experiencing intermittent ear pain over the past few days and requests evaluation. She denies any vomiting.

## 2024-01-03 NOTE — Discharge Instructions (Signed)
 Ondansetron  dissolved in the mouth every 8 hours as needed for nausea or vomiting. Clear liquids(water, gatorade/pedialyte, ginger ale/sprite, chicken broth/soup) and bland things(crackers/toast, rice, potato, bananas) to eat. Avoid acidic foods like lemon/lime/orange/tomato, and avoid greasy/spicy foods.  Follow-up with your primary care

## 2024-01-08 ENCOUNTER — Ambulatory Visit (INDEPENDENT_AMBULATORY_CARE_PROVIDER_SITE_OTHER)

## 2024-01-08 ENCOUNTER — Encounter: Payer: Self-pay | Admitting: Obstetrics and Gynecology

## 2024-01-08 ENCOUNTER — Ambulatory Visit: Admitting: Obstetrics and Gynecology

## 2024-01-08 VITALS — BP 124/78 | HR 101 | Temp 98.2°F | Wt 217.0 lb

## 2024-01-08 DIAGNOSIS — N920 Excessive and frequent menstruation with regular cycle: Secondary | ICD-10-CM

## 2024-01-08 DIAGNOSIS — B372 Candidiasis of skin and nail: Secondary | ICD-10-CM | POA: Diagnosis not present

## 2024-01-08 MED ORDER — MICONAZOLE NITRATE 2 % EX POWD: CUTANEOUS | 3 refills | Status: DC | PRN

## 2024-01-08 NOTE — Assessment & Plan Note (Signed)
 Reviewed TVUS with patient revealing 3 cm hemorrhagic cyst of the right ovary and subcentimeter fibroid. Normal recent hemoglobin. Given unremarkable TVUS and normal hemoglobin, discussed options for expectant management versus hormonal therapy. Patient elects for expectant management Continue daily iron use

## 2024-01-08 NOTE — Progress Notes (Signed)
 28 y.o. [id number] female s/p BTL with menorrhagia (aygestin ), CIN 1 (2025), HTN (2 antihypertensives), preDM, IDA here for TVUS. Single, long time boyfriend. Works at [employer].  Patient's last menstrual period was 12/11/2023 (exact date).   At annual exam, she reported heavy cycles. Using 10 pads with clots on heavy days. Sometimes 3d, sometimes full 7d.  She was prescribed progesterone at annual appointment, however she did not want to start hormonal therapy. Gardasil complete 2016- records 09/12/23 Had recent rotator cuff injury at work. Seeing ortho tomorrow. 12/14/2023 hemoglobin 12.9  GYN HISTORY: Preeclampsia   OB History  Gravida Para Term Preterm AB Living  2 2 2   2   SAB IAB Ectopic Multiple Live Births     0 2    # Outcome Date GA Lbr Len/2nd Weight Sex Type Anes PTL Lv  2 Term 01/02/21 [redacted]w[redacted]d 08:32 / 00:05 5 lb 15.9 oz (2.72 kg) F Vag-Spont None  LIV  1 Term 04/05/19 [redacted]w[redacted]d 08:03 / 00:02 4 lb 14.1 oz (2.214 kg) M Vag-Spont None  LIV    Past Medical History:  Diagnosis Date   Chronic hypertension with superimposed pre-eclampsia 01/02/2021   Gestational diabetes 10/27/2018   Possible VSD seen on fetal echo.  Current Diabetic Medications:  None  [x]  Aspirin  81 mg daily after 12 weeks (? A2/B GDM)  Required Referrals for A1GDM or A2GDM: [x]  Diabetes Education and Testing Supplies [x]  Nutrition Cousult  For A2/B GDM or higher classes of DM [ ]  Diabetes Education and Testing Supplies [ ]  Nutrition Counsult [ ]  Fetal ECHO after 20 weeks  [ ]  Eye exam for retina evaluation    History of gestational diabetes 06/28/2020   History of prior pregnancy with IUGR newborn 06/28/2020   Hypertension    IUGR (intrauterine growth restriction) affecting care of mother 02/12/2019   02/11/19: 8th%tile    Follow up UA dopplers in 2 weeks  NST in 1 week  Repeat growth in 3-4 weeks.  Initiate BPP at 32 weeks.  Guidelines for Antenatal Testing and Sonography  (with updated ICD-10 codes)  Updated  10/26/18 with Dr. Fredia Fresh  IUGR- [phone]    EFW < 10%, nml Dopplers & AFV, AC<3%, - other comorbidities        EFW < 10% w/ AEDF & low AFV or EFW < 3%   Q 3-4wks   As per MFM  At d   Supervision of normal first pregnancy 09/16/2018    Nursing Staff Provider Office Location  [facility] Dating  LMP Language   English Anatomy US   Normal, but possible VSD on echo, repeat echo normal Flu Vaccine  Declined 12-11-18 Genetic Screen  NIPS:  Insufficient fetal fraction, declined testing  TDaP vaccine   Declined 03/06/19 Hgb A1C or  GTT Early: 5.7 Third trimester Failed 2 hr on 10/24/18 Rhogam   n/a   LAB RESULTS  Feeding Plan  Breastfeed     Past Surgical History:  Procedure Laterality Date   ADENOIDECTOMY     EYE SURGERY     TUBAL LIGATION Bilateral 01/03/2021   Procedure: POST PARTUM TUBAL LIGATION;  Surgeon: Barbra Lang PARAS, DO;  Location: [facility];  Service: Gynecology;  Laterality: Bilateral;    Current Outpatient Medications on File Prior to Visit  Medication Sig Dispense Refill   amLODipine  (NORVASC ) 10 MG tablet Take 1 tablet (10 mg total) by mouth daily. 90 tablet 0   Cetirizine  HCl (ZYRTEC  ALLERGY ) 10 MG CAPS Take 1 capsule (10  mg total) by mouth at bedtime. 30 capsule 0   ferrous sulfate 324 MG TBEC Take 324 mg by mouth daily with breakfast.     fluticasone  (FLONASE ) 50 MCG/ACT nasal spray Place 1 spray into both nostrils daily. 16 g 0   ondansetron  (ZOFRAN -ODT) 4 MG disintegrating tablet Take 1 tablet (4 mg total) by mouth every 8 (eight) hours as needed for nausea or vomiting. 10 tablet 0   valsartan  (DIOVAN ) 80 MG tablet Take 1 tablet (80 mg total) by mouth daily. 90 tablet 0   No current facility-administered medications on file prior to visit.    No Known Allergies    PE Today's Vitals   01/08/24 1024  BP: 124/78  Pulse: (!) 101  Temp: 98.2 F (36.8 C)  TempSrc: Oral  SpO2: 98%  Weight: 217 lb (98.4 kg)    Body mass index is 41 kg/m.  Physical Exam Vitals  reviewed.  Constitutional:      General: She is not in acute distress.    Appearance: Normal appearance.  HENT:     Head: Normocephalic and atraumatic.     Nose: Nose normal.  Eyes:     Extraocular Movements: Extraocular movements intact.     Conjunctiva/sclera: Conjunctivae normal.  Pulmonary:     Effort: Pulmonary effort is normal.  Chest:     Comments: Yeast rash inframammary folds Musculoskeletal:        General: Normal range of motion.     Cervical back: Normal range of motion.  Neurological:     General: No focal deficit present.     Mental Status: She is alert.  Psychiatric:        Mood and Affect: Mood normal.        Behavior: Behavior normal.    01/08/24 TVUS: Indications: Menorrhagia  Findings:   Uterus: 9.3 x 6.3 x 5.3 cm, anteverted uterus.  9 mm intramural fibroid noted. Endometrial thickness: 13 mm.  Avascular. Left ovary: 2.6 x 1.4 x 1.1 cm, normal-appearing. Right ovary: 3.5 x 3.6 x 2.8 cm, 3.3 cm complex cyst. No free fluid.  Impression:  3 cm complex cyst of right ovary consistent with hemorrhagic cyst. Subcentimeter intramural fibroid noted.  Vera LULLA Pa, MD  Assessment and Plan:        Menorrhagia with regular cycle Assessment & Plan: Reviewed TVUS with patient revealing 3 cm hemorrhagic cyst of the right ovary and subcentimeter fibroid. Normal recent hemoglobin. Given unremarkable TVUS and normal hemoglobin, discussed options for expectant management versus hormonal therapy. Patient elects for expectant management Continue daily iron use   Candidal intertrigo -     Miconazole Nitrate; Apply topically as needed for itching.  Dispense: 70 g; Refill: 3   Vera LULLA Pa, MD

## 2024-01-10 ENCOUNTER — Ambulatory Visit (INDEPENDENT_AMBULATORY_CARE_PROVIDER_SITE_OTHER): Admitting: Family

## 2024-01-10 ENCOUNTER — Encounter: Payer: Self-pay | Admitting: Family

## 2024-01-10 VITALS — BP 129/88 | HR 103 | Temp 98.8°F | Resp 16 | Ht 61.0 in | Wt 213.8 lb

## 2024-01-10 DIAGNOSIS — Z8669 Personal history of other diseases of the nervous system and sense organs: Secondary | ICD-10-CM

## 2024-01-10 DIAGNOSIS — I10 Essential (primary) hypertension: Secondary | ICD-10-CM | POA: Diagnosis not present

## 2024-01-10 DIAGNOSIS — H6123 Impacted cerumen, bilateral: Secondary | ICD-10-CM

## 2024-01-10 MED ORDER — VALSARTAN 80 MG PO TABS: 80.0000 mg | ORAL_TABLET | Freq: Every day | ORAL | 0 refills | Status: DC

## 2024-01-10 MED ORDER — AMLODIPINE BESYLATE 10 MG PO TABS: 10.0000 mg | ORAL_TABLET | Freq: Every day | ORAL | 0 refills | Status: DC

## 2024-01-10 NOTE — Progress Notes (Signed)
 Patient ID: Gloria Kemp, female    DOB: 1996/03/09  MRN: [id number]  CC: Chronic Conditions Follow-Up  Subjective: Gloria Kemp is a 28 y.o. female who presents for chronic conditions follow-up.   Her concerns today include:  - Doing well on Amlodipine  and Valsartan , no issues/concerns. She does not complain of red flag symptoms such as but not limited to chest pain, shortness of breath, worst headache of life, nausea/vomiting.  - Requests referral to ENT due to history of frequent ear infections. Denies red flag symptoms presently.   Patient Active Problem List   Diagnosis Date Noted   ASCUS with positive high risk HPV cervical 10/18/2023   Well woman exam with routine gynecological exam 09/05/2023   Menorrhagia with regular cycle 09/05/2023   Proteinuria 07/20/2021   Uncontrolled hypertension 07/20/2021   Obesity (BMI 30-39.9) 07/20/2021   Prediabetes 08/08/2020   Child with VSD (ventricular septal defect) 08/08/2020   Abnormal fetal echocardiogram affecting antepartum care of mother 12/11/2018   Alpha thalassemia silent carrier 10/16/2018     Current Outpatient Medications on File Prior to Visit  Medication Sig Dispense Refill   Cetirizine  HCl (ZYRTEC  ALLERGY ) 10 MG CAPS Take 1 capsule (10 mg total) by mouth at bedtime. 30 capsule 0   ferrous sulfate 324 MG TBEC Take 324 mg by mouth daily with breakfast.     fluticasone  (FLONASE ) 50 MCG/ACT nasal spray Place 1 spray into both nostrils daily. 16 g 0   miconazole (MICOTIN) 2 % powder Apply topically as needed for itching. 70 g 3   ondansetron  (ZOFRAN -ODT) 4 MG disintegrating tablet Take 1 tablet (4 mg total) by mouth every 8 (eight) hours as needed for nausea or vomiting. 10 tablet 0   No current facility-administered medications on file prior to visit.    No Known Allergies  Social History   Socioeconomic History   Marital status: Single    Spouse name: Not on file   Number of children: 0   Years of education:  Not on file   Highest education level: Not on file  Occupational History   Occupation: [employer]  Tobacco Use   Smoking status: Never   Smokeless tobacco: Never  Vaping Use   Vaping status: Never Used  Substance and Sexual Activity   Alcohol use: Yes    Comment: Occassionally.   Drug use: Never   Sexual activity: Yes    Birth control/protection: None, Surgical    Comment: BTL  Other Topics Concern   Not on file  Social History Narrative   Not on file   Social Drivers of Health   Financial Resource Strain: Low Risk  (05/27/2023)   Overall Financial Resource Strain (CARDIA)    Difficulty of Paying Living Expenses: Not hard at all  Food Insecurity: No Food Insecurity (06/13/2023)   Hunger Vital Sign    Worried About Running Out of Food in the Last Year: Never true    Ran Out of Food in the Last Year: Never true  Transportation Needs: No Transportation Needs (06/13/2023)   PRAPARE - Administrator, Civil Service (Medical): No    Lack of Transportation (Non-Medical): No  Physical Activity: Inactive (05/27/2023)   Exercise Vital Sign    Days of Exercise per Week: 0 days    Minutes of Exercise per Session: 0 min  Stress: No Stress Concern Present (05/27/2023)   [employer] of Occupational Health - Occupational Stress Questionnaire    Feeling of Stress : Not at  all  Social Connections: Socially Isolated (05/27/2023)   Social Connection and Isolation Panel    Frequency of Communication with Friends and Family: More than three times a week    Frequency of Social Gatherings with Friends and Family: Once a week    Attends Religious Services: Never    Database administrator or Organizations: No    Attends Banker Meetings: Never    Marital Status: Never married  Intimate Partner Violence: Not At Risk (05/27/2023)   Humiliation, Afraid, Rape, and Kick questionnaire    Fear of Current or Ex-Partner: No    Emotionally Abused: No    Physically Abused: No     Sexually Abused: No    Family History  Problem Relation Age of Onset   Hypertension Mother    Obesity Mother    Diabetes Father    Hearing loss Father    Hypertension Father    Obesity Father    Stroke Paternal Aunt    Kidney disease Paternal Uncle    Hypertension Maternal Grandmother    Obesity Maternal Grandmother    Cancer Maternal Grandfather    Arthritis Paternal Grandmother    Diabetes Paternal Grandmother    Hypertension Paternal Grandmother    Miscarriages / Stillbirths Paternal Grandmother    Obesity Paternal Grandmother     Past Surgical History:  Procedure Laterality Date   ADENOIDECTOMY     EYE SURGERY     TUBAL LIGATION Bilateral 01/03/2021   Procedure: POST PARTUM TUBAL LIGATION;  Surgeon: Barbra Lang PARAS, DO;  Location: [facility];  Service: Gynecology;  Laterality: Bilateral;    ROS: Review of Systems Negative except as stated above  PHYSICAL EXAM: BP 129/88   Pulse (!) 103   Temp 98.8 F (37.1 C) (Oral)   Resp 16   Ht 5' 1 (1.549 m)   Wt 213 lb 12.8 oz (97 kg)   LMP 12/11/2023 (Exact Date)   SpO2 98%   BMI 40.40 kg/m   Physical Exam HENT:     Head: Normocephalic and atraumatic.     Right Ear: There is impacted cerumen.     Left Ear: There is impacted cerumen.     Nose: Nose normal.     Mouth/Throat:     Mouth: Mucous membranes are moist.     Pharynx: Oropharynx is clear.  Eyes:     Extraocular Movements: Extraocular movements intact.     Conjunctiva/sclera: Conjunctivae normal.     Pupils: Pupils are equal, round, and reactive to light.  Cardiovascular:     Rate and Rhythm: Tachycardia present.     Pulses: Normal pulses.     Heart sounds: Normal heart sounds.  Pulmonary:     Effort: Pulmonary effort is normal.     Breath sounds: Normal breath sounds.  Musculoskeletal:        General: Normal range of motion.     Cervical back: Normal range of motion and neck supple.  Neurological:     General: No focal deficit present.      Mental Status: She is alert and oriented to person, place, and time.  Psychiatric:        Mood and Affect: Mood normal.        Behavior: Behavior normal.     ASSESSMENT AND PLAN: 1. Primary hypertension (Primary) - Continue Amlodipine  and Valsartan  as prescribed.  - Counseled on blood pressure goal of less than 130/80, low-sodium, DASH diet, medication compliance, and 150 minutes of moderate  intensity exercise per week as tolerated. Counseled on medication adherence and adverse effects. - Follow-up with primary provider in 3 months or sooner if needed.  - amLODipine  (NORVASC ) 10 MG tablet; Take 1 tablet (10 mg total) by mouth daily.  Dispense: 90 tablet; Refill: 0 - valsartan  (DIOVAN ) 80 MG tablet; Take 1 tablet (80 mg total) by mouth daily.  Dispense: 90 tablet; Refill: 0  2. History of frequent ear infections 3. Excessive cerumen in both ear canals - Referral to ENT for evaluation/management. - Ambulatory referral to ENT   Patient was given the opportunity to ask questions.  Patient verbalized understanding of the plan and was able to repeat key elements of the plan. Patient was given clear instructions to go to Emergency Department or return to medical center if symptoms don't improve, worsen, or new problems develop.The patient verbalized understanding.   Orders Placed This Encounter  Procedures   Ambulatory referral to ENT     Requested Prescriptions   Signed Prescriptions Disp Refills   amLODipine  (NORVASC ) 10 MG tablet 90 tablet 0    Sig: Take 1 tablet (10 mg total) by mouth daily.   valsartan  (DIOVAN ) 80 MG tablet 90 tablet 0    Sig: Take 1 tablet (80 mg total) by mouth daily.    Return in about 3 months (around 04/11/2024) for Follow-Up or next available chronic conditions.  Greig JINNY Drones, NP

## 2024-01-10 NOTE — Progress Notes (Signed)
 Follow up for b/p patient forgot to take her b/p medication this am

## 2024-01-14 ENCOUNTER — Encounter (INDEPENDENT_AMBULATORY_CARE_PROVIDER_SITE_OTHER): Payer: Self-pay

## 2024-01-16 ENCOUNTER — Telehealth: Payer: Self-pay | Admitting: Emergency Medicine

## 2024-01-16 NOTE — Telephone Encounter (Signed)
 Copied from CRM [id number]. Topic: Referral - Question >> Jan 16, 2024  1:52 PM Sophia H wrote: Reason for CRM: Just fyi at patients request.  ** Patient states she still has not received a call regarding her referral to ENT. Provided info :    [facility] [address] [place] [address] [phone]   Referral Start Date: 01/10/2024 Referral End Date: 01/09/2025  I called patient and made her aware it takes up to two weeks to get a return call on referrals and she can call this at the number available.  She stated she call today and no one answered so she is waiting on a return call.

## 2024-02-10 ENCOUNTER — Institutional Professional Consult (permissible substitution) (INDEPENDENT_AMBULATORY_CARE_PROVIDER_SITE_OTHER): Admitting: Physician Assistant

## 2024-02-25 ENCOUNTER — Encounter (INDEPENDENT_AMBULATORY_CARE_PROVIDER_SITE_OTHER): Payer: Self-pay | Admitting: Physician Assistant

## 2024-02-25 ENCOUNTER — Ambulatory Visit (INDEPENDENT_AMBULATORY_CARE_PROVIDER_SITE_OTHER): Admitting: Physician Assistant

## 2024-02-25 VITALS — BP 134/74 | HR 101 | Temp 97.9°F | Ht 62.0 in | Wt 210.0 lb

## 2024-02-25 DIAGNOSIS — J302 Other seasonal allergic rhinitis: Secondary | ICD-10-CM

## 2024-02-25 DIAGNOSIS — J309 Allergic rhinitis, unspecified: Secondary | ICD-10-CM | POA: Diagnosis not present

## 2024-02-25 DIAGNOSIS — H6122 Impacted cerumen, left ear: Secondary | ICD-10-CM

## 2024-02-25 DIAGNOSIS — H6993 Unspecified Eustachian tube disorder, bilateral: Secondary | ICD-10-CM | POA: Diagnosis not present

## 2024-02-25 DIAGNOSIS — J343 Hypertrophy of nasal turbinates: Secondary | ICD-10-CM

## 2024-02-25 DIAGNOSIS — H6123 Impacted cerumen, bilateral: Secondary | ICD-10-CM

## 2024-02-25 NOTE — Progress Notes (Signed)
 Dear Dr. Jaycee, Here is my assessment for our mutual patient, Gloria Kemp. Thank you for allowing me the opportunity to care for your patient. Please do not hesitate to contact me should you have any other questions. Sincerely, Chyrl Cohen PA-C  Otolaryngology Clinic Note Referring provider: Dr. Jaycee HPI:  Gloria Kemp is a 28 y.o. female kindly referred by Dr. Jaycee   Discussed the use of AI scribe software for clinical note transcription with the patient, who gave verbal consent to proceed.  History of Present Illness    Gloria Kemp is a 28 year old female who presents with recurrent ear infections and ear wax buildup. She was referred by her primary care doctor for evaluation of recurrent ear infections and ear wax buildup.  She has experienced recurrent ear infections since infancy, with two to three episodes over the past year, the most recent occurring last month. During these episodes, she experiences dizziness and bilateral ear pain, described as 'throbbing.' No significant fever or respiratory symptoms accompany these infections.  She reports that her ear wax has not been addressed in approximately ten years. She is concerned about the pain associated with traditional ear wax removal methods and is inquiring about less painful alternatives.  She has mild dust allergies, experiencing occasional nasal symptoms such as a runny nose when exposed to dust, which resolve spontaneously. She also reports recent episodes of epistaxis, which are unusual for her. No seasonal allergies are reported, and she does not take any regular medications for her ear or nasal symptoms.        Independent Review of Additional Tests or Records:  None   PMH/Meds/All/SocHx/FamHx/ROS:   Past Medical History:  Diagnosis Date   Chronic hypertension with superimposed pre-eclampsia 01/02/2021   Gestational diabetes 10/27/2018   Possible VSD seen on fetal echo.  Current Diabetic Medications:   None  [x]  Aspirin  81 mg daily after 12 weeks (? A2/B GDM)  Required Referrals for A1GDM or A2GDM: [x]  Diabetes Education and Testing Supplies [x]  Nutrition Cousult  For A2/B GDM or higher classes of DM [ ]  Diabetes Education and Testing Supplies [ ]  Nutrition Counsult [ ]  Fetal ECHO after 20 weeks  [ ]  Eye exam for retina evaluation    History of gestational diabetes 06/28/2020   History of prior pregnancy with IUGR newborn 06/28/2020   Hypertension    IUGR (intrauterine growth restriction) affecting care of mother 02/12/2019   02/11/19: 8th%tile    Follow up UA dopplers in 2 weeks  NST in 1 week  Repeat growth in 3-4 weeks.  Initiate BPP at 32 weeks.  Guidelines for Antenatal Testing and Sonography  (with updated ICD-10 codes)  Updated 10/22/18 with Dr. Fredia Fresh  IUGR- [phone]    EFW < 10%, nml Dopplers & AFV, AC<3%, - other comorbidities        EFW < 10% w/ AEDF & low AFV or EFW < 3%   Q 3-4wks   As per MFM  At d   Supervision of normal first pregnancy 09/16/2018    Nursing Staff Provider Office Location  [facility] Dating  LMP Language   English Anatomy US   Normal, but possible VSD on echo, repeat echo normal Flu Vaccine  Declined 12-11-18 Genetic Screen  NIPS:  Insufficient fetal fraction, declined testing  TDaP vaccine   Declined 03/06/19 Hgb A1C or  GTT Early: 5.7 Third trimester Failed 2 hr on 10/24/18 Rhogam   n/a   LAB RESULTS  Feeding Plan  Breastfeed  Past Surgical History:  Procedure Laterality Date   ADENOIDECTOMY     EYE SURGERY     TUBAL LIGATION Bilateral 01/03/2021   Procedure: POST PARTUM TUBAL LIGATION;  Surgeon: Barbra Lang PARAS, DO;  Location: [facility];  Service: Gynecology;  Laterality: Bilateral;    Family History  Problem Relation Age of Onset   Hypertension Mother    Obesity Mother    Diabetes Father    Hearing loss Father    Hypertension Father    Obesity Father    Stroke Paternal Aunt    Kidney disease Paternal Uncle    Hypertension Maternal Grandmother     Obesity Maternal Grandmother    Cancer Maternal Grandfather    Arthritis Paternal Grandmother    Diabetes Paternal Grandmother    Hypertension Paternal Grandmother    Miscarriages / Stillbirths Paternal Grandmother    Obesity Paternal Grandmother      Social Connections: Socially Isolated (05/27/2023)   Social Connection and Isolation Panel    Frequency of Communication with Friends and Family: More than three times a week    Frequency of Social Gatherings with Friends and Family: Once a week    Attends Religious Services: Never    Database Administrator or Organizations: No    Attends Banker Meetings: Never    Marital Status: Never married      Current Outpatient Medications:    amLODipine  (NORVASC ) 10 MG tablet, Take 1 tablet (10 mg total) by mouth daily., Disp: 90 tablet, Rfl: 0   Cetirizine  HCl (ZYRTEC  ALLERGY ) 10 MG CAPS, Take 1 capsule (10 mg total) by mouth at bedtime., Disp: 30 capsule, Rfl: 0   ferrous sulfate 324 MG TBEC, Take 324 mg by mouth daily with breakfast., Disp: , Rfl:    fluticasone  (FLONASE ) 50 MCG/ACT nasal spray, Place 1 spray into both nostrils daily., Disp: 16 g, Rfl: 0   miconazole  (MICOTIN) 2 % powder, Apply topically as needed for itching., Disp: 70 g, Rfl: 3   ondansetron  (ZOFRAN -ODT) 4 MG disintegrating tablet, Take 1 tablet (4 mg total) by mouth every 8 (eight) hours as needed for nausea or vomiting., Disp: 10 tablet, Rfl: 0   valsartan  (DIOVAN ) 80 MG tablet, Take 1 tablet (80 mg total) by mouth daily., Disp: 90 tablet, Rfl: 0   Physical Exam:   BP 134/74   Pulse (!) 101   Temp 97.9 F (36.6 C)   Ht 5' 2 (1.575 m)   Wt 210 lb (95.3 kg)   SpO2 98%   BMI 38.41 kg/m   Pertinent Findings  CN II-XII grossly intact Cerumen impaction right ear, left EAC clear, TM intact with well-pneumatized middle ear space Anterior rhinoscopy: Septum midline; bilateral inferior turbinates with moderate hypertrophy No lesions of oral  cavity/oropharynx; dentition within normal limits No obviously palpable neck masses/lymphadenopathy/thyromegaly No respiratory distress or stridor    Seprately Identifiable Procedures:  Procedure: bilateral ear microscopy and cerumen removal using microscope (CPT 30789) - Mod 25 Pre-procedure diagnosis: unilateral cerumen impaction left external auditory canal Post-procedure diagnosis: same Indication: bilateral cerumen impaction; given patient's otologic complaints and history as well as for improved and comprehensive examination of external ear and tympanic membrane, bilateral otologic examination using microscope was performed and impacted cerumen removed  Procedure: Patient was placed semi-recumbent. Both ear canals were examined using the microscope with findings above. Cerumen removed from the left external auditory canal using suction and currette with improvement in EAC examination and patency. Left: EAC was patent.  TM was intact . Middle ear was aerated. Drainage: none Right: EAC was patent. TM was intact . Middle ear was aerated . Drainage: none Patient tolerated the procedure well.   Impression & Plans:  Gloria Kemp is a 28 y.o. female with the following   Assessment and Plan    Eustachian tube dysfunction with recurrent ear pain   - Start saline sinus irrigation to reduce nasal swelling and improve Eustachian tube function. - Take daily antihistamine for one month to manage allergic rhinitis. - If symptoms persist after one month, start Flonase  nasal spray. - Ordered hearing test   Turbinate hypertrophy and allergic rhinitis contributing to nasal obstruction and epistaxis Significant turbinate hypertrophy likely due to allergic rhinitis, contributing to nasal obstruction and epistaxis. Nasal swelling may be causing Eustachian tube dysfunction. - Start saline sinus irrigation to reduce nasal swelling and prevent epistaxis. - Take daily antihistamine for one month to  manage allergic rhinitis. - If symptoms persist, start Flonase  nasal spray, use saline spray to prevent nasal dryness and epistaxis.  Cerumen impaction, right ear Cerumen impaction in the right ear cleared during the visit.       - f/u 3 months    Thank you for allowing me the opportunity to care for your patient. Please do not hesitate to contact me should you have any other questions.  Sincerely, Chyrl Cohen PA-C [facility] Phone: [phone] Fax: [phone]  02/25/2024, 12:55 PM

## 2024-03-18 ENCOUNTER — Ambulatory Visit
Admission: EM | Admit: 2024-03-18 | Discharge: 2024-03-18 | Disposition: A | Discharge status: Home / Self Care | Attending: Internal Medicine | Admitting: Internal Medicine

## 2024-03-18 DIAGNOSIS — J069 Acute upper respiratory infection, unspecified: Secondary | ICD-10-CM

## 2024-03-18 DIAGNOSIS — H5789 Other specified disorders of eye and adnexa: Secondary | ICD-10-CM | POA: Diagnosis not present

## 2024-03-18 LAB — POCT INFLUENZA A/B
Influenza A, POC: NEGATIVE
Influenza B, POC: NEGATIVE

## 2024-03-18 MED ORDER — ERYTHROMYCIN 5 MG/GM OP OINT: TOPICAL_OINTMENT | OPHTHALMIC | 0 refills | Status: DC

## 2024-03-18 MED ORDER — PROMETHAZINE-DM 6.25-15 MG/5ML PO SYRP: 5.0000 mL | ORAL_SOLUTION | Freq: Two times a day (BID) | ORAL | 0 refills | Status: AC | PRN

## 2024-03-18 NOTE — ED Provider Notes (Signed)
 " [name]    CSN: [id number] Arrival date & time: 03/18/24  1047      History   Chief Complaint No chief complaint on file.   HPI Gloria Kemp is a 28 y.o. female.   28 year old female who presents to urgent care with complaints of cough, eye drainage, ear pain, congestion, chills and fatigue.  She reports her symptoms started on Sunday.  She has family members who have tested positive for flu and have pinkeye.  She was concerned as she has had worsening symptoms.  She reports that she has noticed eye drainage and crustiness in her eye especially in the mornings.  She has been taking over-the-counter medication without relief.  She denies chest pain or shortness of breath, nausea or vomiting.     Past Medical History:  Diagnosis Date   Chronic hypertension with superimposed pre-eclampsia 01/02/2021   Gestational diabetes 10/27/2018   Possible VSD seen on fetal echo.  Current Diabetic Medications:  None  [x]  Aspirin  81 mg daily after 12 weeks (? A2/B GDM)  Required Referrals for A1GDM or A2GDM: [x]  Diabetes Education and Testing Supplies [x]  Nutrition Cousult  For A2/B GDM or higher classes of DM [ ]  Diabetes Education and Testing Supplies [ ]  Nutrition Counsult [ ]  Fetal ECHO after 20 weeks  [ ]  Eye exam for retina evaluation    History of gestational diabetes 06/28/2020   History of prior pregnancy with IUGR newborn 06/28/2020   Hypertension    IUGR (intrauterine growth restriction) affecting care of mother 02/12/2019   02/11/19: 8th%tile    Follow up UA dopplers in 2 weeks  NST in 1 week  Repeat growth in 3-4 weeks.  Initiate BPP at 32 weeks.  Guidelines for Antenatal Testing and Sonography  (with updated ICD-10 codes)  Updated 11-10-18 with Dr. Fredia Fresh  IUGR- [phone]    EFW < 10%, nml Dopplers & AFV, AC<3%, - other comorbidities        EFW < 10% w/ AEDF & low AFV or EFW < 3%   Q 3-4wks   As per MFM  At d   Supervision of normal first pregnancy 09/16/2018     Nursing Staff Provider Office Location  [facility] Dating  LMP Language   English Anatomy US   Normal, but possible VSD on echo, repeat echo normal Flu Vaccine  Declined 12-11-18 Genetic Screen  NIPS:  Insufficient fetal fraction, declined testing  TDaP vaccine   Declined 03/06/19 Hgb A1C or  GTT Early: 5.7 Third trimester Failed 2 hr on 10/24/18 Rhogam   n/a   LAB RESULTS  Feeding Plan  Breastfeed     Patient Active Problem List   Diagnosis Date Noted   ASCUS with positive high risk HPV cervical 10/18/2023   Well woman exam with routine gynecological exam 09/05/2023   Menorrhagia with regular cycle 09/05/2023   Proteinuria 07/20/2021   Uncontrolled hypertension 07/20/2021   Obesity (BMI 30-39.9) 07/20/2021   Prediabetes 08/08/2020   Child with VSD (ventricular septal defect) 08/08/2020   Abnormal fetal echocardiogram affecting antepartum care of mother 12/11/2018   Alpha thalassemia silent carrier 2018-11-10    Past Surgical History:  Procedure Laterality Date   ADENOIDECTOMY     EYE SURGERY     TUBAL LIGATION Bilateral 01/03/2021   Procedure: POST PARTUM TUBAL LIGATION;  Surgeon: Barbra Lang PARAS, DO;  Location: [facility];  Service: Gynecology;  Laterality: Bilateral;    OB History     Gravida  2  Para  2   Term  2   Preterm      AB      Living  2      SAB      IAB      Ectopic      Multiple  0   Live Births  2            Home Medications    Prior to Admission medications  Medication Sig Start Date End Date Taking? Authorizing Provider  amLODipine  (NORVASC ) 10 MG tablet Take 1 tablet (10 mg total) by mouth daily. 01/10/24  Yes Massey, Amy J, NP  Cetirizine  HCl (ZYRTEC  ALLERGY ) 10 MG CAPS Take 1 capsule (10 mg total) by mouth at bedtime. 10/14/23  Yes Raspet, Erin K, PA-C  erythromycin  ophthalmic ointment Place a 1/2 inch ribbon of ointment into the lower eyelids twice daily for 5 days. 03/18/24  Yes Rhyan Wolters A, PA-C  ferrous sulfate 324 MG TBEC  Take 324 mg by mouth daily with breakfast.   Yes [provider]  fluticasone  (FLONASE ) 50 MCG/ACT nasal spray Place 1 spray into both nostrils daily. 10/14/23  Yes Raspet, Erin K, PA-C  miconazole  (MICOTIN) 2 % powder Apply topically as needed for itching. 01/08/24  Yes Hines, Genesis V, MD  promethazine -dextromethorphan (PROMETHAZINE -DM) 6.25-15 MG/5ML syrup Take 5 mLs by mouth every 12 (twelve) hours as needed for cough. 03/18/24  Yes Durene Dodge A, PA-C  valsartan  (DIOVAN ) 80 MG tablet Take 1 tablet (80 mg total) by mouth daily. 01/10/24 04/09/24 Yes Massey, Amy J, NP  ondansetron  (ZOFRAN -ODT) 4 MG disintegrating tablet Take 1 tablet (4 mg total) by mouth every 8 (eight) hours as needed for nausea or vomiting. 01/03/24   Vonna Sharlet POUR, MD    Family History Family History  Problem Relation Age of Onset   Hypertension Mother    Obesity Mother    Diabetes Father    Hearing loss Father    Hypertension Father    Obesity Father    Stroke Paternal Aunt    Kidney disease Paternal Uncle    Hypertension Maternal Grandmother    Obesity Maternal Grandmother    Cancer Maternal Grandfather    Arthritis Paternal Grandmother    Diabetes Paternal Grandmother    Hypertension Paternal Grandmother    Miscarriages / Stillbirths Paternal Grandmother    Obesity Paternal Grandmother     Social History Social History[1]   Allergies   Patient has no known allergies.   Review of Systems Review of Systems  Constitutional:  Positive for fatigue. Negative for chills and fever.  HENT:  Positive for congestion. Negative for ear pain and sore throat.   Eyes:  Positive for discharge. Negative for pain and visual disturbance.  Respiratory:  Positive for cough. Negative for shortness of breath.   Cardiovascular:  Negative for chest pain and palpitations.  Gastrointestinal:  Negative for abdominal pain and vomiting.  Genitourinary:  Negative for dysuria and hematuria.  Musculoskeletal:   Negative for arthralgias and back pain.  Skin:  Negative for color change and rash.  Neurological:  Negative for seizures and syncope.  All other systems reviewed and are negative.    Physical Exam Triage Vital Signs ED Triage Vitals [03/18/24 1147]  Encounter Vitals Group     BP (!) 168/95     Girls Systolic BP Percentile      Girls Diastolic BP Percentile      Boys Systolic BP Percentile      Boys  Diastolic BP Percentile      Pulse Rate 91     Resp 18     Temp 98.7 F (37.1 C)     Temp Source Oral     SpO2 99 %     Weight      Height      Head Circumference      Peak Flow      Pain Score      Pain Loc      Pain Education      Exclude from Growth Chart    No data found.  Updated Vital Signs BP (!) 168/95 (BP Location: Left Arm)   Pulse 91   Temp 98.7 F (37.1 C) (Oral)   Resp 18   LMP 02/26/2024 (Approximate)   SpO2 99%   Visual Acuity Right Eye Distance:   Left Eye Distance:   Bilateral Distance:    Right Eye Near:   Left Eye Near:    Bilateral Near:     Physical Exam Vitals and nursing note reviewed.  Constitutional:      General: She is not in acute distress.    Appearance: She is well-developed.  HENT:     Head: Normocephalic and atraumatic.     Right Ear: Tympanic membrane normal.     Left Ear: Tympanic membrane normal.     Nose: Congestion present.     Mouth/Throat:     Mouth: Mucous membranes are moist.  Eyes:     General:        Right eye: Discharge present.        Left eye: Discharge present.    Extraocular Movements: Extraocular movements intact.     Conjunctiva/sclera: Conjunctivae normal.     Right eye: Right conjunctiva is not injected.     Left eye: Left conjunctiva is not injected.  Cardiovascular:     Rate and Rhythm: Normal rate and regular rhythm.     Heart sounds: No murmur heard. Pulmonary:     Effort: Pulmonary effort is normal. No respiratory distress.     Breath sounds: Normal breath sounds.  Abdominal:      Palpations: Abdomen is soft.     Tenderness: There is no abdominal tenderness.  Musculoskeletal:        General: No swelling.     Cervical back: Neck supple.  Skin:    General: Skin is warm and dry.     Capillary Refill: Capillary refill takes less than 2 seconds.  Neurological:     General: No focal deficit present.     Mental Status: She is alert.  Psychiatric:        Mood and Affect: Mood normal.      UC Treatments / Results  Labs (all labs ordered are listed, but only abnormal results are displayed) Labs Reviewed  POCT INFLUENZA A/B - Normal    EKG   Radiology No results found.  Procedures Procedures (including critical care time)  Medications Ordered in UC Medications - No data to display  Initial Impression / Assessment and Plan / UC Course  I have reviewed the triage vital signs and the nursing notes.  Pertinent labs & imaging results that were available during my care of the patient were reviewed by me and considered in my medical decision making (see chart for details).     Viral upper respiratory tract infection with cough  Eye drainage   Flu A and flu B testing done today is negative. Symptoms are most consistent  with a viral infection.  This does not require antibiotic treatment.  We focus treatment on improving the symptoms.  Although the eyes do not appear to be a bacterial conjunctivitis, we will cover with erythromycin  due to the close exposures and will sending symptoms.  We will treat with the following:  Erythromycin  ointment Place a small ribbon into the eye twice daily for 5 days. Promethazine  DM 5 mL every 12 hours as needed for cough.  Use caution as this medication can cause drowsiness.  Alternate Tylenol  and ibuprofen  for fevers/pain Rest and stay hydrated by drinking plenty of water. Avoid being around groups of people until you are 24 hours without a fever without fever reducing medication.  If you do have to be around others please wear  a mask. Return to urgent care or PCP if symptoms worsen or fail to resolve.    Final Clinical Impressions(s) / UC Diagnoses   Final diagnoses:  Viral upper respiratory tract infection with cough  Eye drainage     Discharge Instructions      Flu A and flu B testing done today is negative. Symptoms are most consistent with a viral infection.  This does not require antibiotic treatment.  We focus treatment on improving the symptoms.  Although the eyes do not appear to be a bacterial conjunctivitis, we will cover with erythromycin  due to the close exposures and will sending symptoms.  We will treat with the following:  Erythromycin  ointment Place a small ribbon into the eye twice daily for 5 days. Promethazine  DM 5 mL every 12 hours as needed for cough.  Use caution as this medication can cause drowsiness.  Alternate Tylenol  and ibuprofen  for fevers/pain Rest and stay hydrated by drinking plenty of water. Avoid being around groups of people until you are 24 hours without a fever without fever reducing medication.  If you do have to be around others please wear a mask. Return to urgent care or PCP if symptoms worsen or fail to resolve.      ED Prescriptions     Medication Sig Dispense Auth. Provider   erythromycin  ophthalmic ointment Place a 1/2 inch ribbon of ointment into the lower eyelids twice daily for 5 days. 3.5 g Que Meneely A, PA-C   promethazine -dextromethorphan (PROMETHAZINE -DM) 6.25-15 MG/5ML syrup Take 5 mLs by mouth every 12 (twelve) hours as needed for cough. 180 mL Teresa Almarie LABOR, [place]      PDMP not reviewed this encounter.    [1]  Social History Tobacco Use   Smoking status: Never   Smokeless tobacco: Never  Vaping Use   Vaping status: Never Used  Substance Use Topics   Alcohol use: Yes    Comment: Occassionally.   Drug use: Never     Teresa Almarie LABOR [place] 03/18/24 1232  "

## 2024-03-18 NOTE — Discharge Instructions (Addendum)
 Flu A and flu B testing done today is negative. Symptoms are most consistent with a viral infection.  This does not require antibiotic treatment.  We focus treatment on improving the symptoms.  Although the eyes do not appear to be a bacterial conjunctivitis, we will cover with erythromycin  due to the close exposures and will sending symptoms.  We will treat with the following:  Erythromycin  ointment Place a small ribbon into the eye twice daily for 5 days. Promethazine  DM 5 mL every 12 hours as needed for cough.  Use caution as this medication can cause drowsiness.  Alternate Tylenol  and ibuprofen  for fevers/pain Rest and stay hydrated by drinking plenty of water. Avoid being around groups of people until you are 24 hours without a fever without fever reducing medication.  If you do have to be around others please wear a mask. Return to urgent care or PCP if symptoms worsen or fail to resolve.

## 2024-03-18 NOTE — ED Triage Notes (Signed)
 Patient presents to the office for pink eye, chills and fatigue. Has been expose to the flu at home.

## 2024-04-10 ENCOUNTER — Ambulatory Visit (INDEPENDENT_AMBULATORY_CARE_PROVIDER_SITE_OTHER): Admitting: Audiology

## 2024-04-17 ENCOUNTER — Ambulatory Visit: Admitting: Family

## 2024-04-17 VITALS — BP 136/84 | HR 102 | Ht 61.0 in | Wt 214.0 lb

## 2024-04-17 DIAGNOSIS — R0989 Other specified symptoms and signs involving the circulatory and respiratory systems: Secondary | ICD-10-CM

## 2024-04-17 DIAGNOSIS — I10 Essential (primary) hypertension: Secondary | ICD-10-CM

## 2024-04-17 LAB — POCT RAPID STREP A (OFFICE): Rapid Strep A Screen: NEGATIVE

## 2024-04-17 MED ORDER — AMLODIPINE BESYLATE 10 MG PO TABS: 10.0000 mg | ORAL_TABLET | Freq: Every day | ORAL | 0 refills | Status: AC

## 2024-04-17 MED ORDER — VALSARTAN 80 MG PO TABS: 80.0000 mg | ORAL_TABLET | Freq: Every day | ORAL | 0 refills | Status: AC

## 2024-04-17 NOTE — Progress Notes (Signed)
 "   Patient ID: Gloria Kemp , female    DOB: 12-Oct-1995  MRN: [id number]  CC: Chronic Conditions Follow-Up  Subjective: Gloria Kemp is a 29 y.o. female who presents for chronic conditions follow-up.   Her concerns today include:  - Doing well on Amlodipine  and Valsartan , no issues/concerns. She does not complain of red flag symptoms such as but not limited to chest pain, shortness of breath, worst headache of life, nausea/vomiting.  - States sore throat and recently her children were sick. Denies red flag symptoms.   Patient Active Problem List   Diagnosis Date Noted   ASCUS with positive high risk HPV cervical 10/18/2023   Well woman exam with routine gynecological exam 09/05/2023   Menorrhagia with regular cycle 09/05/2023   Proteinuria 07/20/2021   Uncontrolled hypertension 07/20/2021   Obesity (BMI 30-39.9) 07/20/2021   Prediabetes 08/08/2020   Child with VSD (ventricular septal defect) 08/08/2020   Abnormal fetal echocardiogram affecting antepartum care of mother 12/11/2018   Alpha thalassemia silent carrier 10/16/2018     Medications Ordered Prior to Encounter[1]  Allergies[2]  Social History   Socioeconomic History   Marital status: Single    Spouse name: Not on file   Number of children: 0   Years of education: Not on file   Highest education level: Not on file  Occupational History   Occupation: [employer]  Tobacco Use   Smoking status: Never   Smokeless tobacco: Never  Vaping Use   Vaping status: Never Used  Substance and Sexual Activity   Alcohol use: Yes    Comment: Occassionally.   Drug use: Never   Sexual activity: Yes    Birth control/protection: None, Surgical    Comment: BTL  Other Topics Concern   Not on file  Social History Narrative   Not on file   Social Drivers of Health   Tobacco Use: Low Risk (03/18/2024)   Patient History    Smoking Tobacco Use: Never    Smokeless Tobacco Use: Never    Passive Exposure: Not on file  Financial  Resource Strain: Low Risk (05/27/2023)   Overall Financial Resource Strain (CARDIA)    Difficulty of Paying Living Expenses: Not hard at all  Food Insecurity: No Food Insecurity (06/13/2023)   Hunger Vital Sign    Worried About Running Out of Food in the Last Year: Never true    Ran Out of Food in the Last Year: Never true  Transportation Needs: No Transportation Needs (06/13/2023)   PRAPARE - Administrator, Civil Service (Medical): No    Lack of Transportation (Non-Medical): No  Physical Activity: Inactive (05/27/2023)   Exercise Vital Sign    Days of Exercise per Week: 0 days    Minutes of Exercise per Session: 0 min  Stress: No Stress Concern Present (05/27/2023)   [employer] of Occupational Health - Occupational Stress Questionnaire    Feeling of Stress : Not at all  Social Connections: Socially Isolated (05/27/2023)   Social Connection and Isolation Panel    Frequency of Communication with Friends and Family: More than three times a week    Frequency of Social Gatherings with Friends and Family: Once a week    Attends Religious Services: Never    Database Administrator or Organizations: No    Attends Banker Meetings: Never    Marital Status: Never married  Intimate Partner Violence: Not At Risk (05/27/2023)   Humiliation, Afraid, Rape, and Kick questionnaire  Fear of Current or Ex-Partner: No    Emotionally Abused: No    Physically Abused: No    Sexually Abused: No  Depression (PHQ2-9): Low Risk (01/10/2024)   Depression (PHQ2-9)    PHQ-2 Score: 0  Alcohol Screen: Low Risk (05/27/2023)   Alcohol Screen    Last Alcohol Screening Score (AUDIT): 2  Housing: Unknown (06/13/2023)   Housing Stability Vital Sign    Unable to Pay for Housing in the Last Year: No    Number of Times Moved in the Last Year: Not on file    Homeless in the Last Year: No  Utilities: Not At Risk (06/13/2023)   AHC Utilities    Threatened with loss of utilities: No  Health  Literacy: Adequate Health Literacy (05/27/2023)   B1300 Health Literacy    Frequency of need for help with medical instructions: Never    Family History  Problem Relation Age of Onset   Hypertension Mother    Obesity Mother    Diabetes Father    Hearing loss Father    Hypertension Father    Obesity Father    Stroke Paternal Aunt    Kidney disease Paternal Uncle    Hypertension Maternal Grandmother    Obesity Maternal Grandmother    Cancer Maternal Grandfather    Arthritis Paternal Grandmother    Diabetes Paternal Grandmother    Hypertension Paternal Grandmother    Miscarriages / Stillbirths Paternal Grandmother    Obesity Paternal Grandmother     Past Surgical History:  Procedure Laterality Date   ADENOIDECTOMY     EYE SURGERY     TUBAL LIGATION Bilateral 01/03/2021   Procedure: POST PARTUM TUBAL LIGATION;  Surgeon: Barbra Lang PARAS, DO;  Location: [facility];  Service: Gynecology;  Laterality: Bilateral;    ROS: Review of Systems Negative except as stated above  PHYSICAL EXAM: BP 136/84   Pulse (!) 102   Ht 5' 1 (1.549 m)   Wt 214 lb (97.1 kg)   LMP 04/02/2024 (Approximate)   SpO2 98%   BMI 40.43 kg/m   Physical Exam HENT:     Head: Normocephalic and atraumatic.     Right Ear: Tympanic membrane, ear canal and external ear normal.     Left Ear: Tympanic membrane, ear canal and external ear normal.     Nose: Nose normal.     Mouth/Throat:     Mouth: Mucous membranes are moist.     Pharynx: Oropharynx is clear.  Eyes:     Extraocular Movements: Extraocular movements intact.     Conjunctiva/sclera: Conjunctivae normal.     Pupils: Pupils are equal, round, and reactive to light.  Cardiovascular:     Rate and Rhythm: Tachycardia present.     Pulses: Normal pulses.     Heart sounds: Normal heart sounds.  Pulmonary:     Effort: Pulmonary effort is normal.     Breath sounds: Normal breath sounds.  Musculoskeletal:        General: Normal range of motion.      Cervical back: Normal range of motion and neck supple.  Neurological:     General: No focal deficit present.     Mental Status: She is alert and oriented to person, place, and time.  Psychiatric:        Mood and Affect: Mood normal.        Behavior: Behavior normal.      ASSESSMENT AND PLAN: 1. Primary hypertension (Primary) - Continue Amlodipine  and Valsartan  as  prescribed.  - Counseled on blood pressure goal of less than 130/80, low-sodium, DASH diet, medication compliance, and 150 minutes of moderate intensity exercise per week as tolerated. Counseled on medication adherence and adverse effects. - Follow-up with primary provider in 3 months or sooner if needed.  - amLODipine  (NORVASC ) 10 MG tablet; Take 1 tablet (10 mg total) by mouth daily.  Dispense: 90 tablet; Refill: 0 - valsartan  (DIOVAN ) 80 MG tablet; Take 1 tablet (80 mg total) by mouth daily.  Dispense: 90 tablet; Refill: 0  2. Upper respiratory symptom - Patient today in office with no cardiopulmonary/acute distress.  - Routine screening.  - COVID-19, Flu A+B and RSV - POCT rapid strep A; Future - Culture, Group A Strep   Patient was given the opportunity to ask questions.  Patient verbalized understanding of the plan and was able to repeat key elements of the plan. Patient was given clear instructions to go to Emergency Department or return to medical center if symptoms don't improve, worsen, or new problems develop.The patient verbalized understanding.   Orders Placed This Encounter  Procedures   COVID-19, Flu A+B and RSV   Culture, Group A Strep   POCT rapid strep A     Requested Prescriptions   Signed Prescriptions Disp Refills   amLODipine  (NORVASC ) 10 MG tablet 90 tablet 0    Sig: Take 1 tablet (10 mg total) by mouth daily.   valsartan  (DIOVAN ) 80 MG tablet 90 tablet 0    Sig: Take 1 tablet (80 mg total) by mouth daily.    Return in about 3 months (around 07/16/2024) for Follow-Up or next available  chronic conditions.  Greig JINNY Chute, NP      [1]  Current Outpatient Medications on File Prior to Visit  Medication Sig Dispense Refill   ferrous sulfate 324 MG TBEC Take 324 mg by mouth daily with breakfast.     fluticasone  (FLONASE ) 50 MCG/ACT nasal spray Place 1 spray into both nostrils daily. 16 g 0   promethazine -dextromethorphan (PROMETHAZINE -DM) 6.25-15 MG/5ML syrup Take 5 mLs by mouth every 12 (twelve) hours as needed for cough. 180 mL 0   No current facility-administered medications on file prior to visit.  [2] No Known Allergies  "

## 2024-04-20 LAB — CULTURE, GROUP A STREP: Strep A Culture: NEGATIVE

## 2024-04-21 ENCOUNTER — Ambulatory Visit: Payer: Self-pay | Admitting: Family

## 2024-04-21 LAB — COVID-19, FLU A+B AND RSV
Influenza A, NAA: NOT DETECTED
Influenza B, NAA: NOT DETECTED
RSV, NAA: NOT DETECTED
SARS-CoV-2, NAA: NOT DETECTED

## 2024-04-29 ENCOUNTER — Ambulatory Visit (INDEPENDENT_AMBULATORY_CARE_PROVIDER_SITE_OTHER)

## 2024-04-29 DIAGNOSIS — Z0110 Encounter for examination of ears and hearing without abnormal findings: Secondary | ICD-10-CM

## 2024-04-29 NOTE — Progress Notes (Signed)
" °  [address], [address] [place], [place] [address] [phone]  Audiological Evaluation    Name: Gloria Kemp     DOB:   03-22-1996      MRN:   [id number]                                                                                     Service Date: 04/29/2024     Accompanied by: self    Patient comes today after Reyes Cohen, PA-C sent a referral for a hearing evaluation due to concerns with recurrent ear infections.   Symptoms Yes Details  Hearing loss  []  Reports no hearing loss except when ears are infected  Tinnitus  []  None/not really per patient  Ear pain/ infections/pressure  [x]  Both ears are itchy but not today.  Reports being told that her ear drums were inflamed  Balance problems  []    Noise exposure history  []  none  Previous ear surgeries  []  none  Family history of hearing loss  [x]  Father has hearing aids, unsure cause but father worked around noise  Amplification  []    Other  [x]  Possible allergies, no sinus issues but has pressure in face    Otoscopy: Right ear: clear external ear canal and notable landmarks visualized on the tympanic membrane. Left ear:  clear external ear canal and notable landmarks visualized on the tympanic membrane.  Tympanometry: Right ear: Type A - Normal external ear canal volume with normal middle ear pressure and normal tympanic membrane compliance. Findings are consistent with normal middle ear function. Left ear: Type A - Normal external ear canal volume with normal middle ear pressure and normal tympanic membrane compliance. Findings are consistent with normal middle ear function.  Hearing Evaluation The hearing test results were completed under inserts and results are deemed to be of good reliability. Test technique:  conventional    Pure tone Audiometry: Right ear- .Normal hearing from [phone]. Left ear-  .Normal hearing from [phone].  Speech Audiometry: Right ear- Speech Reception Threshold (SRT) was  obtained at 5 dBHL. Left ear-Speech Reception Threshold (SRT) was obtained at 10 dBHL.   Word Recognition Score Tested using NU-6 (recorded) Right ear: 100% was obtained at a presentation level of 55 dBHL with contralateral masking which is deemed as  excellent. Left ear: 100% was obtained at a presentation level of 55 dBHL with contralateral masking which is deemed as  excellent.   Impression: There is not a significant difference in pure-tone thresholds between ears., There is not a significant difference in the word recognition score in between ears.    Recommendations: Follow up with ENT as scheduled. Return for a hearing evaluation if concerns with hearing changes arise or per MD recommendation.   Arlean Rake, AUD  "

## 2024-05-29 ENCOUNTER — Ambulatory Visit (INDEPENDENT_AMBULATORY_CARE_PROVIDER_SITE_OTHER): Admitting: Physician Assistant

## 2024-07-24 ENCOUNTER — Ambulatory Visit: Payer: Self-pay | Admitting: Family

## 2024-10-21 ENCOUNTER — Ambulatory Visit: Admitting: Obstetrics and Gynecology
